# Patient Record
Sex: Female | Born: 1970 | Race: Black or African American | Hispanic: No | Marital: Single | Smoking: Current every day smoker
Health system: Southern US, Community
[De-identification: ages and names within clinical notes are randomized; demographics above are authoritative.]

## PROBLEM LIST (undated history)

## (undated) DIAGNOSIS — F419 Anxiety disorder, unspecified: Secondary | ICD-10-CM

## (undated) DIAGNOSIS — D219 Benign neoplasm of connective and other soft tissue, unspecified: Secondary | ICD-10-CM

## (undated) DIAGNOSIS — M1990 Unspecified osteoarthritis, unspecified site: Secondary | ICD-10-CM

## (undated) DIAGNOSIS — K219 Gastro-esophageal reflux disease without esophagitis: Secondary | ICD-10-CM

## (undated) DIAGNOSIS — F32A Depression, unspecified: Secondary | ICD-10-CM

## (undated) DIAGNOSIS — N200 Calculus of kidney: Secondary | ICD-10-CM

## (undated) DIAGNOSIS — J45909 Unspecified asthma, uncomplicated: Secondary | ICD-10-CM

## (undated) DIAGNOSIS — F329 Major depressive disorder, single episode, unspecified: Secondary | ICD-10-CM

## (undated) DIAGNOSIS — N839 Noninflammatory disorder of ovary, fallopian tube and broad ligament, unspecified: Secondary | ICD-10-CM

## (undated) DIAGNOSIS — I10 Essential (primary) hypertension: Secondary | ICD-10-CM

## (undated) DIAGNOSIS — R519 Headache, unspecified: Secondary | ICD-10-CM

## (undated) DIAGNOSIS — N83209 Unspecified ovarian cyst, unspecified side: Secondary | ICD-10-CM

## (undated) DIAGNOSIS — Z8489 Family history of other specified conditions: Secondary | ICD-10-CM

## (undated) DIAGNOSIS — J302 Other seasonal allergic rhinitis: Secondary | ICD-10-CM

## (undated) HISTORY — DX: Noninflammatory disorder of ovary, fallopian tube and broad ligament, unspecified: N83.9

## (undated) HISTORY — PX: TONSILLECTOMY: SUR1361

## (undated) HISTORY — DX: Unspecified ovarian cyst, unspecified side: N83.209

## (undated) HISTORY — PX: APPENDECTOMY: SHX54

## (undated) HISTORY — PX: EXPLORATORY LAPAROTOMY W/ BOWEL RESECTION: SHX1544

## (undated) HISTORY — PX: OTHER SURGICAL HISTORY: SHX169

## (undated) HISTORY — PX: CHOLECYSTECTOMY: SHX55

## (undated) HISTORY — DX: Other seasonal allergic rhinitis: J30.2

---
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
## 1998-02-24 ENCOUNTER — Emergency Department (HOSPITAL_COMMUNITY): Admission: EM | Admit: 1998-02-24 | Discharge: 1998-02-24 | Payer: Self-pay | Admitting: Emergency Medicine

## 1998-02-24 ENCOUNTER — Encounter: Payer: Self-pay | Admitting: Emergency Medicine

## 1998-09-27 ENCOUNTER — Emergency Department (HOSPITAL_COMMUNITY): Admission: EM | Admit: 1998-09-27 | Discharge: 1998-09-27 | Payer: Self-pay

## 1999-03-21 ENCOUNTER — Emergency Department (HOSPITAL_COMMUNITY): Admission: EM | Admit: 1999-03-21 | Discharge: 1999-03-21 | Payer: Self-pay | Admitting: Emergency Medicine

## 2000-02-14 ENCOUNTER — Emergency Department (HOSPITAL_COMMUNITY): Admission: EM | Admit: 2000-02-14 | Discharge: 2000-02-14 | Payer: Self-pay | Admitting: Emergency Medicine

## 2001-12-03 ENCOUNTER — Emergency Department (HOSPITAL_COMMUNITY): Admission: EM | Admit: 2001-12-03 | Discharge: 2001-12-03 | Payer: Self-pay | Admitting: Emergency Medicine

## 2001-12-15 ENCOUNTER — Encounter: Payer: Self-pay | Admitting: Emergency Medicine

## 2001-12-15 ENCOUNTER — Emergency Department (HOSPITAL_COMMUNITY): Admission: EM | Admit: 2001-12-15 | Discharge: 2001-12-15 | Payer: Self-pay | Admitting: Emergency Medicine

## 2002-05-09 ENCOUNTER — Emergency Department (HOSPITAL_COMMUNITY): Admission: EM | Admit: 2002-05-09 | Discharge: 2002-05-10 | Payer: Self-pay | Admitting: Emergency Medicine

## 2003-08-23 ENCOUNTER — Emergency Department (HOSPITAL_COMMUNITY): Admission: EM | Admit: 2003-08-23 | Discharge: 2003-08-23 | Payer: Self-pay | Admitting: Emergency Medicine

## 2003-12-19 ENCOUNTER — Emergency Department (HOSPITAL_COMMUNITY): Admission: EM | Admit: 2003-12-19 | Discharge: 2003-12-19 | Payer: Self-pay | Admitting: Emergency Medicine

## 2004-01-02 ENCOUNTER — Emergency Department (HOSPITAL_COMMUNITY): Admission: EM | Admit: 2004-01-02 | Discharge: 2004-01-02 | Payer: Self-pay | Admitting: Emergency Medicine

## 2004-01-05 ENCOUNTER — Emergency Department (HOSPITAL_COMMUNITY): Admission: EM | Admit: 2004-01-05 | Discharge: 2004-01-05 | Payer: Self-pay | Admitting: Emergency Medicine

## 2004-03-12 ENCOUNTER — Emergency Department (HOSPITAL_COMMUNITY): Admission: EM | Admit: 2004-03-12 | Discharge: 2004-03-13 | Payer: Self-pay | Admitting: Emergency Medicine

## 2004-03-22 ENCOUNTER — Inpatient Hospital Stay (HOSPITAL_COMMUNITY): Admission: AD | Admit: 2004-03-22 | Discharge: 2004-03-23 | Payer: Self-pay | Admitting: Obstetrics and Gynecology

## 2004-04-05 ENCOUNTER — Encounter (INDEPENDENT_AMBULATORY_CARE_PROVIDER_SITE_OTHER): Payer: Self-pay | Admitting: *Deleted

## 2004-04-07 ENCOUNTER — Ambulatory Visit: Payer: Self-pay | Admitting: Family Medicine

## 2004-04-07 ENCOUNTER — Other Ambulatory Visit: Admission: RE | Admit: 2004-04-07 | Discharge: 2004-04-07 | Payer: Self-pay | Admitting: Family Medicine

## 2004-07-07 ENCOUNTER — Ambulatory Visit: Payer: Self-pay | Admitting: Family Medicine

## 2004-07-12 ENCOUNTER — Ambulatory Visit: Payer: Self-pay | Admitting: Family Medicine

## 2004-07-17 ENCOUNTER — Encounter: Admission: RE | Admit: 2004-07-17 | Discharge: 2004-10-15 | Payer: Self-pay | Admitting: Family Medicine

## 2004-08-04 ENCOUNTER — Ambulatory Visit: Payer: Self-pay | Admitting: Sports Medicine

## 2004-09-05 ENCOUNTER — Ambulatory Visit: Payer: Self-pay | Admitting: Family Medicine

## 2004-12-12 ENCOUNTER — Ambulatory Visit: Payer: Self-pay | Admitting: Family Medicine

## 2005-10-25 ENCOUNTER — Ambulatory Visit: Payer: Self-pay | Admitting: Sports Medicine

## 2005-11-08 ENCOUNTER — Inpatient Hospital Stay (HOSPITAL_COMMUNITY): Admission: EM | Admit: 2005-11-08 | Discharge: 2005-11-21 | Payer: Self-pay | Admitting: Emergency Medicine

## 2005-11-09 ENCOUNTER — Encounter (INDEPENDENT_AMBULATORY_CARE_PROVIDER_SITE_OTHER): Payer: Self-pay | Admitting: Specialist

## 2005-11-22 ENCOUNTER — Ambulatory Visit: Payer: Self-pay | Admitting: Family Medicine

## 2005-11-27 ENCOUNTER — Ambulatory Visit: Payer: Self-pay | Admitting: Family Medicine

## 2006-04-04 DIAGNOSIS — I10 Essential (primary) hypertension: Secondary | ICD-10-CM | POA: Insufficient documentation

## 2006-04-04 DIAGNOSIS — K219 Gastro-esophageal reflux disease without esophagitis: Secondary | ICD-10-CM | POA: Insufficient documentation

## 2006-04-04 DIAGNOSIS — F339 Major depressive disorder, recurrent, unspecified: Secondary | ICD-10-CM | POA: Insufficient documentation

## 2006-04-04 DIAGNOSIS — E119 Type 2 diabetes mellitus without complications: Secondary | ICD-10-CM | POA: Insufficient documentation

## 2006-04-04 DIAGNOSIS — E669 Obesity, unspecified: Secondary | ICD-10-CM | POA: Insufficient documentation

## 2006-04-04 DIAGNOSIS — F410 Panic disorder [episodic paroxysmal anxiety] without agoraphobia: Secondary | ICD-10-CM | POA: Insufficient documentation

## 2006-04-05 ENCOUNTER — Encounter (INDEPENDENT_AMBULATORY_CARE_PROVIDER_SITE_OTHER): Payer: Self-pay | Admitting: *Deleted

## 2007-05-22 ENCOUNTER — Encounter (INDEPENDENT_AMBULATORY_CARE_PROVIDER_SITE_OTHER): Payer: Self-pay | Admitting: Family Medicine

## 2007-05-22 ENCOUNTER — Ambulatory Visit: Payer: Self-pay | Admitting: Family Medicine

## 2007-05-22 ENCOUNTER — Other Ambulatory Visit: Admission: RE | Admit: 2007-05-22 | Discharge: 2007-05-22 | Payer: Self-pay | Admitting: Family Medicine

## 2007-05-22 DIAGNOSIS — E785 Hyperlipidemia, unspecified: Secondary | ICD-10-CM | POA: Insufficient documentation

## 2007-05-22 LAB — CONVERTED CEMR LAB
Hgb A1c MFr Bld: 9 %
Pap Smear: NORMAL
Whiff Test: POSITIVE

## 2007-05-23 LAB — CONVERTED CEMR LAB
ALT: 16 units/L (ref 0–35)
AST: 14 units/L (ref 0–37)
Alkaline Phosphatase: 78 units/L (ref 39–117)
CO2: 25 meq/L (ref 19–32)
Chlamydia, DNA Probe: NEGATIVE
Creatinine, Ser: 0.64 mg/dL (ref 0.40–1.20)
GC Probe Amp, Genital: NEGATIVE
Sodium: 139 meq/L (ref 135–145)
Total Bilirubin: 0.2 mg/dL — ABNORMAL LOW (ref 0.3–1.2)
Total Protein: 8 g/dL (ref 6.0–8.3)

## 2007-06-05 ENCOUNTER — Ambulatory Visit: Payer: Self-pay | Admitting: Family Medicine

## 2007-06-05 ENCOUNTER — Encounter (INDEPENDENT_AMBULATORY_CARE_PROVIDER_SITE_OTHER): Payer: Self-pay | Admitting: Family Medicine

## 2007-06-06 DIAGNOSIS — L732 Hidradenitis suppurativa: Secondary | ICD-10-CM | POA: Insufficient documentation

## 2007-06-06 LAB — CONVERTED CEMR LAB
BUN: 11 mg/dL (ref 6–23)
CO2: 19 meq/L (ref 19–32)
Chloride: 103 meq/L (ref 96–112)
LDL Cholesterol: 94 mg/dL (ref 0–99)
Potassium: 4.3 meq/L (ref 3.5–5.3)
Triglycerides: 80 mg/dL (ref <150)
VLDL: 16 mg/dL (ref 0–40)

## 2007-11-13 ENCOUNTER — Ambulatory Visit: Payer: Self-pay | Admitting: Family Medicine

## 2007-11-13 ENCOUNTER — Telehealth (INDEPENDENT_AMBULATORY_CARE_PROVIDER_SITE_OTHER): Payer: Self-pay | Admitting: *Deleted

## 2007-11-13 LAB — CONVERTED CEMR LAB: Hgb A1c MFr Bld: 8.2 %

## 2007-12-22 ENCOUNTER — Ambulatory Visit: Payer: Self-pay | Admitting: Family Medicine

## 2007-12-22 ENCOUNTER — Encounter: Payer: Self-pay | Admitting: Family Medicine

## 2007-12-22 DIAGNOSIS — N920 Excessive and frequent menstruation with regular cycle: Secondary | ICD-10-CM | POA: Insufficient documentation

## 2007-12-22 LAB — CONVERTED CEMR LAB
Chloride: 102 meq/L (ref 96–112)
Glucose, Bld: 149 mg/dL — ABNORMAL HIGH (ref 70–99)
MCHC: 32.4 g/dL (ref 30.0–36.0)
Potassium: 4.3 meq/L (ref 3.5–5.3)
RBC: 4.63 M/uL (ref 3.87–5.11)
RDW: 14.7 % (ref 11.5–15.5)
Sodium: 140 meq/L (ref 135–145)

## 2007-12-25 ENCOUNTER — Emergency Department (HOSPITAL_BASED_OUTPATIENT_CLINIC_OR_DEPARTMENT_OTHER): Admission: EM | Admit: 2007-12-25 | Discharge: 2007-12-25 | Payer: Self-pay | Admitting: Emergency Medicine

## 2008-03-07 ENCOUNTER — Emergency Department (HOSPITAL_BASED_OUTPATIENT_CLINIC_OR_DEPARTMENT_OTHER): Admission: EM | Admit: 2008-03-07 | Discharge: 2008-03-07 | Payer: Self-pay | Admitting: Emergency Medicine

## 2008-03-29 ENCOUNTER — Telehealth: Payer: Self-pay | Admitting: *Deleted

## 2008-05-20 ENCOUNTER — Telehealth (INDEPENDENT_AMBULATORY_CARE_PROVIDER_SITE_OTHER): Payer: Self-pay | Admitting: Family Medicine

## 2008-05-23 ENCOUNTER — Emergency Department (HOSPITAL_BASED_OUTPATIENT_CLINIC_OR_DEPARTMENT_OTHER): Admission: EM | Admit: 2008-05-23 | Discharge: 2008-05-23 | Payer: Self-pay | Admitting: Emergency Medicine

## 2008-05-31 ENCOUNTER — Ambulatory Visit: Payer: Self-pay | Admitting: Family Medicine

## 2008-05-31 ENCOUNTER — Telehealth (INDEPENDENT_AMBULATORY_CARE_PROVIDER_SITE_OTHER): Payer: Self-pay | Admitting: Family Medicine

## 2008-05-31 LAB — CONVERTED CEMR LAB: Hgb A1c MFr Bld: 10.2 %

## 2008-06-01 ENCOUNTER — Encounter: Payer: Self-pay | Admitting: Family Medicine

## 2008-06-15 ENCOUNTER — Encounter (INDEPENDENT_AMBULATORY_CARE_PROVIDER_SITE_OTHER): Payer: Self-pay | Admitting: Family Medicine

## 2008-06-15 ENCOUNTER — Ambulatory Visit: Payer: Self-pay | Admitting: Family Medicine

## 2008-06-15 LAB — CONVERTED CEMR LAB
ALT: 16 units/L (ref 0–35)
Albumin: 4.2 g/dL (ref 3.5–5.2)
CO2: 26 meq/L (ref 19–32)
Calcium: 9.6 mg/dL (ref 8.4–10.5)
Chloride: 97 meq/L (ref 96–112)
Sodium: 135 meq/L (ref 135–145)
TSH: 1.444 microintl units/mL (ref 0.350–4.500)
Total Protein: 7.9 g/dL (ref 6.0–8.3)

## 2008-06-28 ENCOUNTER — Telehealth: Payer: Self-pay | Admitting: *Deleted

## 2008-07-23 ENCOUNTER — Ambulatory Visit: Payer: Self-pay | Admitting: Family Medicine

## 2008-07-23 ENCOUNTER — Telehealth: Payer: Self-pay | Admitting: Family Medicine

## 2008-08-04 ENCOUNTER — Encounter (INDEPENDENT_AMBULATORY_CARE_PROVIDER_SITE_OTHER): Payer: Self-pay | Admitting: Family Medicine

## 2008-11-16 ENCOUNTER — Encounter: Payer: Self-pay | Admitting: Family Medicine

## 2008-11-16 ENCOUNTER — Ambulatory Visit: Payer: Self-pay | Admitting: Family Medicine

## 2008-11-19 ENCOUNTER — Ambulatory Visit: Payer: Self-pay | Admitting: Family Medicine

## 2008-12-13 ENCOUNTER — Telehealth: Payer: Self-pay | Admitting: Family Medicine

## 2009-01-08 ENCOUNTER — Emergency Department (HOSPITAL_BASED_OUTPATIENT_CLINIC_OR_DEPARTMENT_OTHER): Admission: EM | Admit: 2009-01-08 | Discharge: 2009-01-08 | Payer: Self-pay | Admitting: Emergency Medicine

## 2009-01-21 ENCOUNTER — Telehealth: Payer: Self-pay | Admitting: Family Medicine

## 2009-02-08 ENCOUNTER — Telehealth: Payer: Self-pay | Admitting: Family Medicine

## 2009-03-14 ENCOUNTER — Emergency Department (HOSPITAL_BASED_OUTPATIENT_CLINIC_OR_DEPARTMENT_OTHER): Admission: EM | Admit: 2009-03-14 | Discharge: 2009-03-14 | Payer: Self-pay | Admitting: Emergency Medicine

## 2009-03-16 ENCOUNTER — Emergency Department (HOSPITAL_BASED_OUTPATIENT_CLINIC_OR_DEPARTMENT_OTHER): Admission: EM | Admit: 2009-03-16 | Discharge: 2009-03-17 | Payer: Self-pay | Admitting: Emergency Medicine

## 2009-07-24 ENCOUNTER — Encounter: Payer: Self-pay | Admitting: Family Medicine

## 2009-07-25 ENCOUNTER — Ambulatory Visit: Payer: Self-pay | Admitting: Diagnostic Radiology

## 2009-07-25 ENCOUNTER — Emergency Department (HOSPITAL_BASED_OUTPATIENT_CLINIC_OR_DEPARTMENT_OTHER): Admission: EM | Admit: 2009-07-25 | Discharge: 2009-07-25 | Payer: Self-pay | Admitting: Emergency Medicine

## 2009-07-27 ENCOUNTER — Other Ambulatory Visit: Admission: RE | Admit: 2009-07-27 | Discharge: 2009-07-27 | Payer: Self-pay | Admitting: Gynecology

## 2009-07-27 ENCOUNTER — Ambulatory Visit: Payer: Self-pay | Admitting: Gynecology

## 2009-08-02 ENCOUNTER — Ambulatory Visit: Payer: Self-pay | Admitting: Diagnostic Radiology

## 2009-08-02 ENCOUNTER — Emergency Department (HOSPITAL_BASED_OUTPATIENT_CLINIC_OR_DEPARTMENT_OTHER): Admission: EM | Admit: 2009-08-02 | Discharge: 2009-08-02 | Payer: Self-pay | Admitting: Emergency Medicine

## 2009-09-23 ENCOUNTER — Emergency Department (HOSPITAL_BASED_OUTPATIENT_CLINIC_OR_DEPARTMENT_OTHER): Admission: EM | Admit: 2009-09-23 | Discharge: 2009-09-23 | Payer: Self-pay | Admitting: Emergency Medicine

## 2009-09-23 ENCOUNTER — Ambulatory Visit: Payer: Self-pay | Admitting: Diagnostic Radiology

## 2009-11-16 ENCOUNTER — Ambulatory Visit: Payer: Self-pay | Admitting: Radiology

## 2010-01-12 ENCOUNTER — Emergency Department (HOSPITAL_BASED_OUTPATIENT_CLINIC_OR_DEPARTMENT_OTHER): Admission: EM | Admit: 2010-01-12 | Discharge: 2009-11-16 | Payer: Self-pay | Admitting: Emergency Medicine

## 2010-03-02 ENCOUNTER — Emergency Department (HOSPITAL_BASED_OUTPATIENT_CLINIC_OR_DEPARTMENT_OTHER)
Admission: EM | Admit: 2010-03-02 | Discharge: 2010-03-02 | Payer: Self-pay | Source: Home / Self Care | Admitting: Emergency Medicine

## 2010-03-02 LAB — URINALYSIS, ROUTINE W REFLEX MICROSCOPIC
Bilirubin Urine: NEGATIVE
Hgb urine dipstick: NEGATIVE
Ketones, ur: NEGATIVE mg/dL
Protein, ur: NEGATIVE mg/dL
Urine Glucose, Fasting: NEGATIVE mg/dL
Urobilinogen, UA: 0.2 mg/dL (ref 0.0–1.0)

## 2010-03-02 LAB — CBC
HCT: 33.4 % — ABNORMAL LOW (ref 36.0–46.0)
MCV: 78.4 fL (ref 78.0–100.0)
Platelets: 392 10*3/uL (ref 150–400)
RBC: 4.26 MIL/uL (ref 3.87–5.11)
RDW: 13.7 % (ref 11.5–15.5)
WBC: 11.4 10*3/uL — ABNORMAL HIGH (ref 4.0–10.5)

## 2010-03-02 LAB — COMPREHENSIVE METABOLIC PANEL
ALT: 18 U/L (ref 0–35)
Albumin: 4.2 g/dL (ref 3.5–5.2)
Alkaline Phosphatase: 84 U/L (ref 39–117)
BUN: 21 mg/dL (ref 6–23)
Chloride: 103 mEq/L (ref 96–112)
Potassium: 4 mEq/L (ref 3.5–5.1)
Sodium: 140 mEq/L (ref 135–145)
Total Bilirubin: 0.4 mg/dL (ref 0.3–1.2)
Total Protein: 8 g/dL (ref 6.0–8.3)

## 2010-03-02 LAB — DIFFERENTIAL
Basophils Absolute: 0 10*3/uL (ref 0.0–0.1)
Eosinophils Relative: 2 % (ref 0–5)
Lymphocytes Relative: 34 % (ref 12–46)
Lymphs Abs: 3.9 10*3/uL (ref 0.7–4.0)
Neutrophils Relative %: 56 % (ref 43–77)

## 2010-03-09 NOTE — Miscellaneous (Signed)
   Clinical Lists Changes  Problems: Removed problem of ABSCESS, BREAST, RIGHT (ICD-611.0) Removed problem of ROUTINE GYNECOLOGICAL EXAMINATION (ICD-V72.31) Removed problem of SCREENING FOR MALIGNANT NEOPLASM OF THE CERVIX (ICD-V76.2) Medications: Removed medication of GLIPIZIDE 10 MG TABS (GLIPIZIDE) Take 1 by mouth two times a day for diabetes Removed medication of METFORMIN HCL 1000 MG TABS (METFORMIN HCL) Take 1 by mouth twice a day for diabetes Removed medication of LISINOPRIL-HYDROCHLOROTHIAZIDE 20-25 MG TABS (LISINOPRIL-HYDROCHLOROTHIAZIDE) one tab by mouth daily Removed medication of SIMVASTATIN 80 MG  TABS (SIMVASTATIN) 1 tablet by mouth every evening for cholesterol - this is to replace previous dose Removed medication of FLEXERIL 10 MG TABS (CYCLOBENZAPRINE HCL) two times a day as needed pain Removed medication of TRIAMCINOLONE ACETONIDE 0.1 % LOTN (TRIAMCINOLONE ACETONIDE) apply to afected areas on body two times a day Do not apply to face Dispense 1 large tube Removed medication of CLARITIN 10 MG TABS (LORATADINE) 1 by mouth Daily Removed medication of RANITIDINE HCL 150 MG TABS (RANITIDINE HCL) 1 tablet by mouth two times a day for heartburn

## 2010-03-09 NOTE — Progress Notes (Signed)
Summary: triage   Phone Note Call from Patient Call back at Home Phone [phone]   Caller: Patient Summary of Call: had an incision a few months ago and it is has continued to leak.  is a diabetic Initial call taken by: De Nurse,  February 08, 2009 1:40 PM  Follow-up for Phone Call        had cyst removed from under her breast a few months ago. drains at time of her cycle. feels a hard lump. no pain. fluid is pus, bloody when expressed. appt tomorrow at 2:45. she will arrange to leave work early to keep appt. will call back if unable to get off early gave her the number for [facility] as she no longer has Medicaid. told her she will need to bring money to tomorrow's appt & can make payments after that Follow-up by: Golden Circle RN,  February 08, 2009 1:46 PM  Additional Follow-up for Phone Call Additional follow up Details #1::        she had I&D. missed subsequent wound checks. agree with plan Additional Follow-up by: Lequita Asal  MD,  February 08, 2009 1:53 PM

## 2010-03-09 NOTE — Assessment & Plan Note (Signed)
Summary: hand and feet numbness/ACM   Vital Signs:  Patient Profile:   40 Years Old Female Height:     65 inches Weight:      254 pounds Pulse rate:   95 / minute BP sitting:   195 / 127 Cuff size:   regular  Vitals Entered By: Lillia Pauls CMA (November 13, 2007 2:07 PM)                 Serial Vital Signs/Assessments:  Time      Position  BP       Pulse  Resp  Temp     By 3:07 PM             139/82   88                    JESSICA FLEEGER CMA   PCP:  Drue Dun MD  Chief Complaint:  F/U BP and has not been taking meds.  History of Present Illness: Holly Wells is a 39year old woman that has not been taking her BP meds and is worried that her BG may be high.  1. HTN: On Rx Lisinopril 20 mg daily, but has not taken medication since Sunday (4 days). Has not been taking blood pressures at home. Today, BP 195/127. Checked again with larger cuff: 139/82.  2. Diabetes: On Rx Glipizide-Metformin 5-500 2 pills twice daily. States that she takes her medications, but does not check her BG herself because she left her glucometer and supplies in another state.   3. Hyperlipidemia: On Rx Simvastatin 40. Denies myalgias.  Denies CP, SOB, N/V, +Diarrhea x 1 yesterday, + mild HA- generalized, described as band across head, + blurry vision. She also complains of fatigue, daytime somnolence, snoring. She denies peripheral edema. Endorses good diet choices and exercise daily.  Weight at last visit: 256.6 April/09 BP at last visit: 152/98 April/09    Updated Prior Medication List: LISINOPRIL-HYDROCHLOROTHIAZIDE 20-12.5 MG TABS (LISINOPRIL-HYDROCHLOROTHIAZIDE) 1 tablet by mouth daily GLIPIZIDE-METFORMIN HCL 5-500 MG  TABS (GLIPIZIDE-METFORMIN HCL) 2 tablets by mouth two times a day with food SIMVASTATIN 80 MG  TABS (SIMVASTATIN) 1 tablet by mouth every evening for cholesterol - this is to replace previous dose  Current Allergies: No known allergies    Family History:    Reviewed  history from 05/22/2007 and no changes required:       Aunt: colon ca       Father: DM, Depression, HTN       Mother: Esophageal stricture, allergies       FHx of Prostate Cancer, DM, HTN on dad's side       FHx of DM, HTN on mother's side  Social History:    Reviewed history from 05/22/2007 and no changes required:       lives with 2 daughters (ages 70 and 2). She is unemployed (laid off clerical).  No tob, social occ ETOH, no IVDU.  Getting regular exercise at [employer]    Review of Systems       The patient complains of dyspnea on exertion and headaches.  The patient denies weight gain, vision loss, chest pain, syncope, peripheral edema, prolonged cough, abdominal pain, suspicious skin lesions, difficulty walking, and depression.     Physical Exam  General:     alert, well-developed, well-nourished, well-hydrated, and overweight-appearing.   Eyes:     vision grossly intact, pupils equal, pupils round, and pupils reactive to light.   Lungs:  Normal respiratory effort, chest expands symmetrically. Lungs are clear to auscultation, no crackles or wheezes. Heart:     Normal rate and regular rhythm. S1 and S2 normal without gallop, murmur, click, rub or other extra sounds. Abdomen:     Bowel sounds positive,abdomen soft and non-tender without masses, organomegaly or hernias noted. Pulses:     R and L dorsalis pedis and posterior tibial pulses are full and equal bilaterally Extremities:     No clubbing, cyanosis, edema, or deformity noted with normal full range of motion of all joints.   Neurologic:     No cranial nerve deficits noted. Plantar reflexes are down-going bilaterally. DTRs are symmetrical throughout. Sensory, motor and coordinative functions appear intact. Skin:     Intact without suspicious lesions or rashes  Diabetes Management Exam:    Foot Exam (with socks and/or shoes not present):       Sensory-Pinprick/Light touch:          Left medial foot (L-4):  normal          Left dorsal foot (L-5): normal          Left lateral foot (S-1): normal       Sensory-Monofilament:          Left foot: diminished          Right foot: diminished       Sensory-other: Diminished sensation great toes bilaterally, otherwise normal.       Inspection:          Left foot: normal       Nails:          Left foot: normal          Right foot: normal    Impression & Recommendations:  Problem # 1:  HYPERTENSION, BENIGN SYSTEMIC (ICD-401.1) Assessment: Deteriorated Will add HCTZ 12.5 to current Rx of Lisinopril 20. Will have patient follow up in 1-2 weeks with PCP for recheck. I have encouraged her to check her BP at home or at the drug store daily until the next visit. I encouraged her to get regular follow-up for her chronic conditions as they may lead to heart disease, blindness, kidney failure, etc if left uncontrolled. I advised her to call the clinic or go to the ER is she has CP, SOB, double vision, confusion, problems with speech, or loss of muscle control on one side of her body, or any other concerns. Her updated medication list for this problem includes:    Lisinopril-hydrochlorothiazide 20-12.5 Mg Tabs (Lisinopril-hydrochlorothiazide) .Marland Kitchen... 1 tablet by mouth daily  Orders: [facility]- Est Level  3 (56213)   Problem # 2:  DIABETES MELLITUS II, UNCOMPLICATED (ICD-250.00) Assessment: Unchanged Continue current medications. Check BG regularly. Awaiting HgbA1c results from today. I discussed the possibility of early diabetic peripheral neuropathy and made her aware that we should focus on preventing this complication by controlling her diabetes through diet, exercise, and regular monitoring of her BG.  Her updated medication list for this problem includes:    Lisinopril-hydrochlorothiazide 20-12.5 Mg Tabs (Lisinopril-hydrochlorothiazide) .Marland Kitchen... 1 tablet by mouth daily    Glipizide-metformin Hcl 5-500 Mg Tabs (Glipizide-metformin hcl) .Marland Kitchen... 2 tablets by mouth two  times a day with food  Orders: A1C-[facility] (08657) Glucose Cap-[facility] (84696) [facility]- Est Level  3 (29528)   Problem # 3:  HYPERLIPIDEMIA (ICD-272.4) Assessment: Comment Only Continue Simvastatin. Her updated medication list for this problem includes:    Simvastatin 80 Mg Tabs (Simvastatin) .Marland Kitchen... 1 tablet by mouth every evening for  cholesterol - this is to replace previous dose  Orders: [facility]- Est Level  3 (04540)   Problem # 4:  OBESITY, NOS (ICD-278.00) Assessment: Unchanged I encouraged a low fat, low sodium diabetic diet and regular exercise. Orders: [facility]- Est Level  3 (98119)   Complete Medication List: 1)  Lisinopril-hydrochlorothiazide 20-12.5 Mg Tabs (Lisinopril-hydrochlorothiazide) .Marland Kitchen.. 1 tablet by mouth daily 2)  Glipizide-metformin Hcl 5-500 Mg Tabs (Glipizide-metformin hcl) .... 2 tablets by mouth two times a day with food 3)  Simvastatin 80 Mg Tabs (Simvastatin) .Marland Kitchen.. 1 tablet by mouth every evening for cholesterol - this is to replace previous dose   Patient Instructions: 1)  Please schedule a follow-up appointment in 1- 2 weeks with PCP, Dr. Deirdre Peer. 2)  Check your blood sugars regularly. If your readings are usually above 300  or below 70 you should contact our office. 3)  It is important that your Diabetic A1c level is checked every 3 months. 4)  See your eye doctor yearly to check for diabetic eye damage. 5)  Check your feet each night for sore areas, calluses or signs of infection. 6)  Check your Blood Pressure regularly. If it is above 160/100 you should make an appointment.   Prescriptions: LISINOPRIL-HYDROCHLOROTHIAZIDE 20-12.5 MG TABS (LISINOPRIL-HYDROCHLOROTHIAZIDE) 1 tablet by mouth daily  #30 x 3   Entered and Authorized by:   Helane Rima MD   Signed by:   Helane Rima MD on 11/13/2007   Method used:   Electronically to        [facility] [phone]* (retail)       [address]       [place], [place]  [address]       Ph: [phone]       Fax:  [phone]   RxID:   [id number]  ] Laboratory Results   Blood Tests   Date/Time Received: November 13, 2007 2:09 PM  Date/Time Reported: November 13, 2007 2:19 PM   HGBA1C: 8.2%   (Normal Range: Non-Diabetic - 3-6%   Control Diabetic - 6-8%)  Comments: ...............test performed by......Marland KitchenBonnie A. [place], [place] (ASCP)

## 2010-03-29 ENCOUNTER — Other Ambulatory Visit: Payer: PRIVATE HEALTH INSURANCE

## 2010-03-29 ENCOUNTER — Ambulatory Visit (INDEPENDENT_AMBULATORY_CARE_PROVIDER_SITE_OTHER): Payer: PRIVATE HEALTH INSURANCE | Admitting: Gynecology

## 2010-03-29 DIAGNOSIS — N920 Excessive and frequent menstruation with regular cycle: Secondary | ICD-10-CM

## 2010-03-29 DIAGNOSIS — N949 Unspecified condition associated with female genital organs and menstrual cycle: Secondary | ICD-10-CM

## 2010-03-29 DIAGNOSIS — D3910 Neoplasm of uncertain behavior of unspecified ovary: Secondary | ICD-10-CM

## 2010-03-29 DIAGNOSIS — N83209 Unspecified ovarian cyst, unspecified side: Secondary | ICD-10-CM

## 2010-04-21 LAB — WET PREP, GENITAL: Trich, Wet Prep: NONE SEEN

## 2010-04-21 LAB — DIFFERENTIAL
Basophils Relative: 1 % (ref 0–1)
Monocytes Relative: 5 % (ref 3–12)
Neutro Abs: 6.9 10*3/uL (ref 1.7–7.7)
Neutrophils Relative %: 75 % (ref 43–77)

## 2010-04-21 LAB — GC/CHLAMYDIA PROBE AMP, GENITAL
Chlamydia, DNA Probe: NEGATIVE
GC Probe Amp, Genital: NEGATIVE

## 2010-04-21 LAB — URINE MICROSCOPIC-ADD ON

## 2010-04-21 LAB — URINALYSIS, ROUTINE W REFLEX MICROSCOPIC
Bilirubin Urine: NEGATIVE
Glucose, UA: >1000 mg/dL — AB
Hgb urine dipstick: NEGATIVE
Protein, ur: NEGATIVE mg/dL
Urobilinogen, UA: 0.2 mg/dL (ref 0.0–1.0)

## 2010-04-21 LAB — CBC
Hemoglobin: 12.3 g/dL (ref 12.0–15.0)
MCHC: 33.6 g/dL (ref 30.0–36.0)
Platelets: 366 10*3/uL (ref 150–400)
RBC: 4.33 MIL/uL (ref 3.87–5.11)

## 2010-04-21 LAB — BASIC METABOLIC PANEL
Calcium: 9.6 mg/dL (ref 8.4–10.5)
GFR calc Af Amer: >60 mL/min (ref >60)
GFR calc non Af Amer: >60 mL/min (ref >60)
Glucose, Bld: 267 mg/dL — ABNORMAL HIGH (ref 70–99)
Sodium: 139 mEq/L (ref 135–145)

## 2010-04-23 LAB — CBC
HCT: 36.6 % (ref 36.0–46.0)
Hemoglobin: 11.9 g/dL — ABNORMAL LOW (ref 12.0–15.0)
MCH: 27.6 pg (ref 26.0–34.0)
MCV: 83.2 fL (ref 78.0–100.0)
Platelets: 322 10*3/uL (ref 150–400)
RBC: 4.19 MIL/uL (ref 3.87–5.11)
RBC: 4.39 MIL/uL (ref 3.87–5.11)
RDW: 12.9 % (ref 11.5–15.5)
RDW: 13.5 % (ref 11.5–15.5)
WBC: 10.4 10*3/uL (ref 4.0–10.5)
WBC: 9.2 10*3/uL (ref 4.0–10.5)

## 2010-04-23 LAB — BASIC METABOLIC PANEL
BUN: 13 mg/dL (ref 6–23)
CO2: 26 mEq/L (ref 19–32)
Calcium: 9.1 mg/dL (ref 8.4–10.5)
Chloride: 101 mEq/L (ref 96–112)
Creatinine, Ser: 0.6 mg/dL (ref 0.4–1.2)
GFR calc Af Amer: >60 mL/min (ref >60)
GFR calc Af Amer: >60 mL/min (ref >60)
GFR calc non Af Amer: >60 mL/min (ref >60)
Glucose, Bld: 291 mg/dL — ABNORMAL HIGH (ref 70–99)
Potassium: 4.3 mEq/L (ref 3.5–5.1)
Sodium: 136 mEq/L (ref 135–145)

## 2010-04-23 LAB — POCT CARDIAC MARKERS
CKMB, poc: <1 ng/mL — ABNORMAL LOW (ref 1.0–8.0)
Myoglobin, poc: 36.6 ng/mL (ref 12–200)
Troponin i, poc: <0.05 ng/mL (ref 0.00–0.09)

## 2010-04-23 LAB — URINALYSIS, ROUTINE W REFLEX MICROSCOPIC
Glucose, UA: 250 mg/dL — AB
Hgb urine dipstick: NEGATIVE
Specific Gravity, Urine: 1.03 (ref 1.005–1.030)

## 2010-04-23 LAB — DIFFERENTIAL
Basophils Absolute: 0 10*3/uL (ref 0.0–0.1)
Eosinophils Absolute: 0.2 10*3/uL (ref 0.0–0.7)
Eosinophils Relative: 2 % (ref 0–5)
Eosinophils Relative: 2 % (ref 0–5)
Lymphocytes Relative: 33 % (ref 12–46)
Lymphs Abs: 3 10*3/uL (ref 0.7–4.0)
Lymphs Abs: 3.4 10*3/uL (ref 0.7–4.0)
Monocytes Absolute: 0.6 10*3/uL (ref 0.1–1.0)
Monocytes Relative: 5 % (ref 3–12)
Neutro Abs: 6.2 10*3/uL (ref 1.7–7.7)
Neutrophils Relative %: 59 % (ref 43–77)

## 2010-04-23 LAB — PREGNANCY, URINE: Preg Test, Ur: NEGATIVE

## 2010-04-23 LAB — URINE MICROSCOPIC-ADD ON

## 2010-04-26 ENCOUNTER — Other Ambulatory Visit: Payer: PRIVATE HEALTH INSURANCE

## 2010-04-26 ENCOUNTER — Ambulatory Visit: Payer: PRIVATE HEALTH INSURANCE | Admitting: Gynecology

## 2010-04-26 LAB — URINALYSIS, ROUTINE W REFLEX MICROSCOPIC
Bilirubin Urine: NEGATIVE
Glucose, UA: NEGATIVE mg/dL
Hgb urine dipstick: NEGATIVE
Specific Gravity, Urine: 1.021 (ref 1.005–1.030)
pH: 6 (ref 5.0–8.0)

## 2010-05-01 ENCOUNTER — Other Ambulatory Visit: Payer: Self-pay | Admitting: Gynecology

## 2010-05-01 ENCOUNTER — Ambulatory Visit (INDEPENDENT_AMBULATORY_CARE_PROVIDER_SITE_OTHER): Payer: PRIVATE HEALTH INSURANCE | Admitting: Gynecology

## 2010-05-01 ENCOUNTER — Other Ambulatory Visit: Payer: PRIVATE HEALTH INSURANCE

## 2010-05-01 DIAGNOSIS — N920 Excessive and frequent menstruation with regular cycle: Secondary | ICD-10-CM

## 2010-05-01 DIAGNOSIS — R1031 Right lower quadrant pain: Secondary | ICD-10-CM

## 2010-05-01 DIAGNOSIS — N83209 Unspecified ovarian cyst, unspecified side: Secondary | ICD-10-CM

## 2010-05-01 DIAGNOSIS — R1903 Right lower quadrant abdominal swelling, mass and lump: Secondary | ICD-10-CM

## 2010-05-09 LAB — CBC
Hemoglobin: 11.9 g/dL — ABNORMAL LOW (ref 12.0–15.0)
MCHC: 32.7 g/dL (ref 30.0–36.0)
MCV: 84.3 fL (ref 78.0–100.0)
RBC: 4.31 MIL/uL (ref 3.87–5.11)
WBC: 9.7 10*3/uL (ref 4.0–10.5)

## 2010-05-09 LAB — GLUCOSE, CAPILLARY: Glucose-Capillary: 279 mg/dL — ABNORMAL HIGH (ref 70–99)

## 2010-05-09 LAB — DIFFERENTIAL
Eosinophils Absolute: 0.2 10*3/uL (ref 0.0–0.7)
Lymphs Abs: 3.2 10*3/uL (ref 0.7–4.0)
Monocytes Absolute: 0.7 10*3/uL (ref 0.1–1.0)
Monocytes Relative: 7 % (ref 3–12)
Neutrophils Relative %: 57 % (ref 43–77)

## 2010-05-09 LAB — BASIC METABOLIC PANEL
CO2: 26 mEq/L (ref 19–32)
Chloride: 101 mEq/L (ref 96–112)
Creatinine, Ser: 0.6 mg/dL (ref 0.4–1.2)
GFR calc Af Amer: >60 mL/min (ref >60)
Potassium: 4.1 mEq/L (ref 3.5–5.1)
Sodium: 139 mEq/L (ref 135–145)

## 2010-05-22 LAB — DIFFERENTIAL
Basophils Absolute: 0.1 10*3/uL (ref 0.0–0.1)
Basophils Relative: 1 % (ref 0–1)
Neutro Abs: 5.5 10*3/uL (ref 1.7–7.7)
Neutrophils Relative %: 84 % — ABNORMAL HIGH (ref 43–77)

## 2010-05-22 LAB — COMPREHENSIVE METABOLIC PANEL
Alkaline Phosphatase: 76 U/L (ref 39–117)
BUN: 11 mg/dL (ref 6–23)
Chloride: 101 mEq/L (ref 96–112)
Creatinine, Ser: 0.7 mg/dL (ref 0.4–1.2)
Glucose, Bld: 195 mg/dL — ABNORMAL HIGH (ref 70–99)
Potassium: 4.2 mEq/L (ref 3.5–5.1)
Total Bilirubin: 0.5 mg/dL (ref 0.3–1.2)

## 2010-05-22 LAB — CBC
HCT: 38.7 % (ref 36.0–46.0)
Hemoglobin: 12.3 g/dL (ref 12.0–15.0)
MCV: 83.2 fL (ref 78.0–100.0)
RDW: 13.3 % (ref 11.5–15.5)
WBC: 6.6 10*3/uL (ref 4.0–10.5)

## 2010-05-22 LAB — URINALYSIS, ROUTINE W REFLEX MICROSCOPIC
Bilirubin Urine: NEGATIVE
Hgb urine dipstick: NEGATIVE
Ketones, ur: NEGATIVE mg/dL
Nitrite: NEGATIVE
Urobilinogen, UA: 0.2 mg/dL (ref 0.0–1.0)

## 2010-06-02 ENCOUNTER — Institutional Professional Consult (permissible substitution) (INDEPENDENT_AMBULATORY_CARE_PROVIDER_SITE_OTHER): Payer: PRIVATE HEALTH INSURANCE | Admitting: Gynecology

## 2010-06-02 DIAGNOSIS — N83209 Unspecified ovarian cyst, unspecified side: Secondary | ICD-10-CM

## 2010-06-02 DIAGNOSIS — N949 Unspecified condition associated with female genital organs and menstrual cycle: Secondary | ICD-10-CM

## 2010-06-23 NOTE — H&P (Signed)
NAMEDOREA, DUFF               ACCOUNT NO.:  [id number]   MEDICAL RECORD NO.:  [id number]          PATIENT TYPE:  INP   LOCATION:  0098                         FACILITY:  [facility]   PHYSICIAN:  Anselm Pancoast. Weatherly, M.D.DATE OF BIRTH:  1970-10-20   DATE OF ADMISSION:  11/08/2005  DATE OF DISCHARGE:                                HISTORY & PHYSICAL   CHIEF COMPLAINT:  Abdominal pain two weeks.   HISTORY:  Holly Wells is a 40 year old overweight black female presenting  to the emergency room this evening with the following history.  She said  that for approximately 2 weeks she has been having increasing abdominal  pain.  She was seen by her regular physician thought to have a urinary tract  infection and was on antibiotics for approximately a week.  Her symptoms  persisted she did not have significant nausea and vomiting but started  having more pain and today presented to the emergency room she was seen by  the ER physician. Laboratory studies were unremarkable.  She was definitely  tender in the right lower quadrant.  She weighs about 275 pounds and a CT  was obtained which showed an abscess what looked like anterior to the cecum.  There was a chronic abscess in the abdominal wall in the appears to be a  foreign body that looks like a bone, kind of a long calcified type area deep  in the abdominal wall, or really right at the peritoneal area towards the  right lateral lower abdomen.  Dr. Effie Shy asked me to see the patient.  I saw  her probably about 10:30, 11 o'clock she was definitely mildly tender.  She  was not acutely tender with generalized peritoneal signs and on questioning  she said that this has just kind of been increasing.  It has always been  kind of the lower abdomen and she was treated for urinary tract infection.  She is followed in the family practice clinic and she is on metformin for an  elevated sugar.  She has a history of high blood pressure I am not sure that  she is actually on any blood pressure medications at this time.   PAST HISTORY:  She has had a previous open cholecystectomy years ago through  a very large right subcostal incision and has not had any other intestinal  problems.  I discussed with her and she said that she had gone to a fish fry  approximately 2 weeks ago but really does not remember anything as far as  swallowing a bone or foreign body.  I recommended that we proceed on with a  laparotomy.  She will need an open procedure and we hopefully will be able  to get to this through a transverse right lower quadrant type incision.  Her  size of certainly of significant concern.  Diabetes mellitus, adult onset  obesity and hypertension.   ALLERGIES:  No chronic medicines.   SOCIOECONOMIC:  She is single.  She works at [employer]. 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   FAMILY HISTORY:  I do not think  it is contributory.   She admits to occasional alcohol use and she denies smoking.   PHYSICAL EXAM:  GENERAL:  She is a short, very heavy black female in mild  discomfort but certainly not in real acute distress.  VITAL SIGNS:  Temperature was 99.3, pulse of 92, respirations 20, blood  pressure is 136/93 and she weighs 270 pounds.  EYES, HOUSE EARS, NOSE AND THROAT:  She appears adequately hydrated.  There  is no cervical or supraclavicular lymphadenopathy.  LUNGS:  Are clear.  CARDIAC:  Normal sinus rhythm.  BREASTS:  Negative.  There is a very large subcostal incision that is well-healed and she is  tender kind of to the right below the umbilicus  about like an appendix.  I  did not do a pelvic and rectal examination is she has had the CT shows the  acute problem.  EXTREMITIES: No pedal edema.  CNS: Is physiologic.   ADMISSION IMPRESSION:  1. Chronic abscess pericecal with abdominal wall with questionable foreign      body or bone anterior to the cecum.  The patient will be started on      Unasyn and prepared for  laparotomy.           ______________________________  Anselm Pancoast. Zachery Dakins, M.D.     WJW/MEDQ  D:  11/09/2005  T:  11/10/2005  Job:  [id number]

## 2010-06-23 NOTE — Discharge Summary (Signed)
Holly Wells               ACCOUNT NO.:  [id number]   MEDICAL RECORD NO.:  [id number]          PATIENT TYPE:  INP   LOCATION:  1612                         FACILITY:  [facility]   PHYSICIAN:  Anselm Pancoast. Weatherly, M.D.DATE OF BIRTH:  1970/02/06   DATE OF ADMISSION:  11/08/2005  DATE OF DISCHARGE:  11/21/2005                                 DISCHARGE SUMMARY   DISCHARGE DIAGNOSES:  1. Chronic abscess, abdominal wall, and also an abscess adjacent to the      cecum, probably a foreign body perforation.  2. Exogenous obesity.  3. Diabetes and hypertension.   OPERATIONS:  Exploratory laparotomy and excision of chronic abscess  immediately adjacent to cecum and drainage of abscess and debridement,  abdominal wall, right lateral abdominal wall.   HISTORY:  Holly Wells is a 40 year old very overweight female, 264 pounds,  she is only about 5 feet 3 inches, who presented to the emergency room at  [facility] with the following history:  She had approximately two weeks  earlier been treated in the [facility] family practice where she is followed for  her diabetes and hypertension and started on a medication for a urinary  tract infection.  She took the medicine as instructed but continued to have  the lower right abdominal pain, and then when the medicine was completed and  she did not improve this next time she returned to the emergency room at  [facility].  She was seen by the ER physician, laboratory studies were  obtained, and they obtained a CT that looked like there was a foreign body  that looked like a fish bone or something anterior to the cecum and a  chronic abscess in the abdominal wall immediately adjacent to the  anterolateral surface of the cecum and also an inflammatory mass around the  cecum between the abdominal wall abscess and the cecum.  I reviewed the CT  and was in agreement that this was an acute inflammatory process.  Her  temperature was 99.3, pulse was 92, and her  white count, however, was 7,100  with hematocrit of 39, and a urinalysis was unremarkable.  Urine pregnancy  was negative, and on physical exam she definitely was tender to palpation at  this area.  It is a little more lateral than the typical appendicitis, and  the area certainly coincides with the area seen on the CAT scan.  An EKG was  obtained, and this was unremarkable.   She was taken to surgery, and Dr. Lurene Shadow assisted because of her size.  She  has had a previous open cholecystectomy through a very generous subcostal  incision, and I made a transverse incision just below the umbilicus, and  upon opening into the abdominal cavity there was an inflammatory mass  immediately adjacent to the cecum that appeared to have a little, not really  a communication, but sort of like a diverticulum, and I think that this is  probably where the area of question on the foreign body which the  radiologist reads probably was something that she ingested and chronically  eroded through the  area.  The abscess, or the mass adjacent to the cecum,  was completely excised.  I did send it over and had it x-rayed to see  whether they could see a foreign body or a bone within it, since that was  what the CT was reported, and they saw no evidence of any bony contents, and  I actually opened the abscess.  The pathologist said this was fibroadipose  tissue with chronic inflammatory changes and hemorrhage.  The cultures  showed organisms consistent with colon organisms but did not grow MRSA.  There were gram-positive cocci and also gram-positive rods, and there was  also an abscess in the abdominal wall that was drained and debrided and the  little sliver of what appeared to be a fish bone possibly was in this, but  neither the radiologist nor the pathologist could identify anything that was  obviously a bone in either of the chronic abscesses.   On admission her glucose was elevated.  She was on Glucophage 1000  mg p.o.  b.i.d., which she reports that she was taking, but when she was in the  emergency room her glucose was 405.  Her serum electrolytes and creatinine  were all normal.  Postoperatively we tried putting her on a sliding scale,  but that was not satisfactory coverage, and we put her on a Glucommander  with insulin coverage.  The actual abscess of the intra-abdominal that was  adjacent to the cecum was completely excised.  The little area where it  communicates was invaginated and closed with Lembert sutures, and then we  did incidental appendectomy.  There were inflammatory cells within the lumen  of the appendix, but she did not have an acute inflammatory process of the  appendix and this was not an appendicitis as the origin of this abscess.  Postoperatively her wound was packed open and she has had wet-to-dry  dressing changes and has had a very difficult time with the wound cleaning  up, and we waited until the day prior to her discharge before doing delayed  closure of wound to make sure that all the inflammatory tissue and  everything was cleaned up and then did a delayed closure of the wound.   Her insulin management, the nurse coordinator has been assisting [place] and they  have recommended that we stop the Glucommander but go with Lantus 20 units  at hour of sleep and that NovoLog be changed to a sliding coverage, but her  glucoses over the last 48 hours have not been in a range where she has  needed coverage, and this past evening, even though she still had an IV at  keep open of dextrose 5, actually had some sugars in the 63 and 83.  Her  glucose this morning was 116.  The patient's wound has been closed, and on  wound check today it appears to be healing nicely.  We switched her to  Augmentin 875 b.i.d., and I will see her in the office for a wound check in  48 hours.  I talked to Dr. Deirdre Peer at family practice, who is her physician, and she recommended that we discharge her on  the Glucophage 1000 mg p.o.  b.i.d. and they will see her tomorrow morning to check her glucose and if  insulin is needed they will coordinate the insulin management.  The patient  appears to be very noncompliant in following any type of diabetic diet here.  Immediately before going home her family brought  her in a double [employer]  fried chicken with double potatoes, biscuits, etc., which obviously is not a  diabetic diet and most likely this has contributed to her 30-pound weight  gain over the last six months that Dr. Deirdre Peer has noted.   I will see her for followup of her abdominal wound and abscesses.  She has  had no problems with food intake and eating during this hospitalization.  We  may need a follow-up CT.  She works as a Technical sales engineer at [employer],  and her abdominal incision will require that she be off work probably about  the next four weeks.  She understands that exercise and diet is very  important in management of her diabetes as well as her weight management.  She is discharged to continue with her hypertension and diabetic management  as she was before her hospitalization, and any changes will be made by the  family practice people.  Her glucoses have been elevated.  Her sugars have  been documented to be high and low, but her blood pressure has never been  elevated during this hospitalization.  A few times when she appeared to be  in pain she would have a systolic pressure of maybe 160, but her systolic  pressures have ranged from 110-150 with the diastolics ranging from 72 to 85  on repeated measurements.  Her temperature curve has been not like that of a  patient with an intra-abdominal abscess, and her white counts have been  basically normal throughout this hospitalization.  Her white count this  morning, on the 17th, was normal.  Her glucose this morning was 116, and her  hematocrit is 33, which is the same as it was preoperatively.            ______________________________  Anselm Pancoast. Zachery Dakins, M.D.     WJW/MEDQ  D:  11/21/2005  T:  11/22/2005  Job:  [id number]   cc:   Drue Dun, M.D.

## 2010-06-23 NOTE — Op Note (Signed)
NAMEFANCY, DUNKLEY               ACCOUNT NO.:  [id number]   MEDICAL RECORD NO.:  [id number]          PATIENT TYPE:  INP   LOCATION:  0098                         FACILITY:  [facility]   PHYSICIAN:  Anselm Pancoast. 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   ANESTHESIA:  General.   SURGEON:  Anselm Pancoast. Zachery Dakins, M.D.   ASSISTANT:  Leonie Man, M.D.   HISTORY:  Holly Wells is a 40 year old very overweight female who  presented to the emergency room with about a two week plus history of  increasing right lower quadrant pain.  She has been treated for what was  thought to be a urinary tract infection and completed the antibiotics but  her symptoms persisted.  She presented to the emergency room today.  She is  adult onset diabetic on metformin and has a history of hypertension.  CT was  performed which shows what looks like an abscess anterior to the cecum and  also what looks like a foreign-body kind of up in the abdominal wall area  just anterior to the inflammation anterior to the cecum.  Patient has had a  previous open cholecystectomy.  Her tenderness is lateral and right below  the umbilicus, like a high lying appendix.  I recommended we start her on  antibiotics and permission obtained for laparotomy open.   The patient positioned on the OR table.  Induction of general anesthesia.  She has PAS stockings and after induction of general anesthesia a Foley  catheter was inserted sterilely.  Abdomen was prepped with Betadine surgical  scrub solution and draped in sterile manner.  I first made a  transverse  incision sort of like an appendix down through the approximately four more  inches of adipose tissue.  Dr. Lurene Shadow assisted with the incision bigger, we  went anterior and laterally.  Then we kind of opened through the abdominal  wall layers and then carefully entered into the peritoneal cavity.  There  was of the inflammatory process that appeared really and the abdominal wall  and then after we made the incision bigger, we could feel also a second  inflammatory mass anterior to the cecum that was probably about 2 inches  higher than the abscess of the abdominal wall.  We made the incision more  laterally going down over this inflammatory mass within the abdominal wall  and entered a frank layer of pus.  This was cultured aerobically and  anaerobically but did not have much odor.  The area we carefully looked to  see if we could see anything looked like a bone in the abscess cavity.  There were some hard areas but nothing that we could definitely recognize as  a bone.  We were expecting from  the CTs to find something like a chicken  bone.  Next, this mass that was kind of intraperitoneally anterior to the  cecum.  It could not be approached through this incision and we opened the  incision medially so that a Thompson retractor could be placed to pull up on  the abdominal wall.  She had had a previous open cholecystectomy and had  some adhesions anterior to the lower edge of the gallbladder and these were  carefully taken down between [facility] and the omental side tied with 2-0  Vicryl.  Then this mass was actually free.  It was very closely adherent to  the cecum but we could not find anything that was obviously a perforation  there was one area that looked like maybe something could have eroded  through the peritoneal anterior surface of the ascending colon and I tied  this with 2-0 Vicryl and then invaginated it sort of like it was a little  diverticulum but I am not sure that  it was really a true perforation.  Then  this abscess that was about like a small fist size, I opened it on the back  table.  There was actually a chronic abscess and then wondered if there was  a foreign body in it.  We had the specimen x-ray but the radiologist said  there is no bony structure in this and this is just a chronic abscess of the  omentum.  There was a second infarcted appendices epiploica on the cecum  that we kind of carefully removed and treated in a similar manner, if it was  a diverticulum and then using 2-0 silk to kind of do a pursestring.  We  found the appendix.  It is in normal position and was not acutely inflamed  and we tied off the base of the appendix with 2-0 Vicryl pursestring suture  of 3-0 silk.  The stump inverted and the pursestring suture tied.  The  appendiceal mesentery had been divided and ligated with 2-0 Vicryl.  The  small-bowel appears unremarkable.  The cecum itself certainly does not feel  to have any type of tumor within it and we elected not to remove anything  else.  The abdominal wall and where she has had this kind of chronic abscess  in the abdominal wall, some of this thickened, inflamed area was debrided  and then I closed and this is the internal oblique and transversalis  laterally with large sutures of #1 Novofil and then closed the external  oblique with #1 Novofil in a second layer.  More medially the tissue was  kind of more normal appearance and this was closed also with figure-of-eight  of #1 Novofil.  The external oblique was closed as a second layer and then  since she had actually had this frank abscess and she is so heavy, we  elected to pack the fatty tissue with Betadine-soaked Kerlix and covered it  with ABD.  I did put a Blake drain laterally, brought up sort of high like  in gallbladder type drain but then ran it down laterally just under the  abdominal wall where she had had this abscess of the abdominal wall.   The patient had an oral tube into the stomach.  We replaced and put it through  the nose and we will keep her Foley catheter and NG tube and keep her on  broad antibiotics.  Not sure the etiology of this but we certainly could not  feel anything else in the abdomen.  It was a difficult exposure and  retraction because her obesity, etc.  Sponge and needle counts were correct  x2.  Estimated blood loss is probably 100 mL.           ______________________________  Anselm Pancoast. Zachery Dakins, M.D.     WJW/MEDQ  D:  11/09/2005  T:  11/10/2005  Job:  [id number]

## 2010-06-30 ENCOUNTER — Other Ambulatory Visit: Payer: PRIVATE HEALTH INSURANCE

## 2010-06-30 ENCOUNTER — Ambulatory Visit: Payer: PRIVATE HEALTH INSURANCE | Admitting: Gynecology

## 2010-07-04 ENCOUNTER — Other Ambulatory Visit: Payer: PRIVATE HEALTH INSURANCE

## 2010-07-04 ENCOUNTER — Ambulatory Visit: Payer: PRIVATE HEALTH INSURANCE | Admitting: Gynecology

## 2010-07-29 ENCOUNTER — Emergency Department (HOSPITAL_BASED_OUTPATIENT_CLINIC_OR_DEPARTMENT_OTHER)
Admission: EM | Admit: 2010-07-29 | Discharge: 2010-07-29 | Disposition: A | Discharge status: Home / Self Care | Payer: PRIVATE HEALTH INSURANCE | Attending: Emergency Medicine | Admitting: Emergency Medicine

## 2010-07-29 ENCOUNTER — Emergency Department (INDEPENDENT_AMBULATORY_CARE_PROVIDER_SITE_OTHER): Payer: PRIVATE HEALTH INSURANCE

## 2010-07-29 DIAGNOSIS — R0602 Shortness of breath: Secondary | ICD-10-CM

## 2010-07-29 DIAGNOSIS — R05 Cough: Secondary | ICD-10-CM

## 2010-07-29 DIAGNOSIS — I10 Essential (primary) hypertension: Secondary | ICD-10-CM | POA: Insufficient documentation

## 2010-07-29 DIAGNOSIS — Z79899 Other long term (current) drug therapy: Secondary | ICD-10-CM | POA: Insufficient documentation

## 2010-07-29 DIAGNOSIS — J329 Chronic sinusitis, unspecified: Secondary | ICD-10-CM | POA: Insufficient documentation

## 2010-07-29 DIAGNOSIS — E119 Type 2 diabetes mellitus without complications: Secondary | ICD-10-CM | POA: Insufficient documentation

## 2010-07-29 DIAGNOSIS — R059 Cough, unspecified: Secondary | ICD-10-CM

## 2010-07-29 DIAGNOSIS — R509 Fever, unspecified: Secondary | ICD-10-CM

## 2010-09-03 ENCOUNTER — Encounter: Payer: Self-pay | Admitting: *Deleted

## 2010-09-03 ENCOUNTER — Emergency Department (HOSPITAL_BASED_OUTPATIENT_CLINIC_OR_DEPARTMENT_OTHER)
Admission: EM | Admit: 2010-09-03 | Discharge: 2010-09-03 | Disposition: A | Discharge status: Home / Self Care | Payer: PRIVATE HEALTH INSURANCE | Attending: Emergency Medicine | Admitting: Emergency Medicine

## 2010-09-03 ENCOUNTER — Other Ambulatory Visit: Payer: Self-pay

## 2010-09-03 DIAGNOSIS — I951 Orthostatic hypotension: Secondary | ICD-10-CM | POA: Insufficient documentation

## 2010-09-03 DIAGNOSIS — R5381 Other malaise: Secondary | ICD-10-CM | POA: Insufficient documentation

## 2010-09-03 DIAGNOSIS — I10 Essential (primary) hypertension: Secondary | ICD-10-CM | POA: Insufficient documentation

## 2010-09-03 DIAGNOSIS — E119 Type 2 diabetes mellitus without complications: Secondary | ICD-10-CM | POA: Insufficient documentation

## 2010-09-03 DIAGNOSIS — R5383 Other fatigue: Secondary | ICD-10-CM | POA: Insufficient documentation

## 2010-09-03 DIAGNOSIS — F411 Generalized anxiety disorder: Secondary | ICD-10-CM | POA: Insufficient documentation

## 2010-09-03 HISTORY — DX: Essential (primary) hypertension: I10

## 2010-09-03 HISTORY — DX: Anxiety disorder, unspecified: F41.9

## 2010-09-03 LAB — URINE MICROSCOPIC-ADD ON

## 2010-09-03 LAB — BASIC METABOLIC PANEL
BUN: 20 mg/dL (ref 6–23)
CO2: 24 mEq/L (ref 19–32)
GFR calc non Af Amer: >60 mL/min (ref >60)
Glucose, Bld: 148 mg/dL — ABNORMAL HIGH (ref 70–99)
Potassium: 3.9 mEq/L (ref 3.5–5.1)

## 2010-09-03 LAB — URINALYSIS, ROUTINE W REFLEX MICROSCOPIC
Bilirubin Urine: NEGATIVE
Glucose, UA: NEGATIVE mg/dL
Protein, ur: NEGATIVE mg/dL
Urobilinogen, UA: 0.2 mg/dL (ref 0.0–1.0)

## 2010-09-03 LAB — HEPATIC FUNCTION PANEL
ALT: 22 U/L (ref 0–35)
Albumin: 3.6 g/dL (ref 3.5–5.2)
Alkaline Phosphatase: 87 U/L (ref 39–117)
Total Bilirubin: 0.1 mg/dL — ABNORMAL LOW (ref 0.3–1.2)
Total Protein: 8.1 g/dL (ref 6.0–8.3)

## 2010-09-03 LAB — PREGNANCY, URINE: Preg Test, Ur: NEGATIVE

## 2010-09-03 MED ORDER — SODIUM CHLORIDE 0.9 % IV BOLUS (SEPSIS)
1000.0000 mL | Freq: Once | INTRAVENOUS | Status: AC
  Administered 2010-09-03 (×2): 1000 mL via INTRAVENOUS

## 2010-09-03 MED ORDER — ONDANSETRON HCL 4 MG/2ML IJ SOLN
4.0000 mg | Freq: Once | INTRAMUSCULAR | Status: AC
  Administered 2010-09-03: 4 mg via INTRAVENOUS
  Filled 2010-09-03: qty 2

## 2010-09-03 NOTE — ED Provider Notes (Signed)
History     Chief Complaint  Patient presents with  . Weakness   HPI Comments: Weak and "feeling dehydrated" since this morning.  Awoke feeling lightheaded with a couple episodes of diarrhea.  Admits to heavy alcohol use last night.  Nausea but no vomiting or abdominal pain.  No chest pain or SOB.  Unable to quatify how much she drank.  "I've felt this way before when I've been dehydrated".  Feels more lightheaded with position changes.    Past Medical History  Diagnosis Date  . Diabetes mellitus   . Hypertension   . Anxiety     Past Surgical History  Procedure Date  . Cholecystectomy   . Appendectomy   . Tonsillectomy     No family history on file.  History  Substance Use Topics  . Smoking status: Never Smoker   . Smokeless tobacco: Not on file  . Alcohol Use: Yes     social    OB History    Grav Para Term Preterm Abortions TAB SAB Ect Mult Living                  Review of Systems  Constitutional: Positive for activity change, appetite change and fatigue. Negative for fever.  HENT: Negative for congestion and rhinorrhea.   Eyes: Negative for visual disturbance.  Respiratory: Negative for choking, chest tightness and shortness of breath.   Cardiovascular: Negative for chest pain.  Gastrointestinal: Positive for nausea and diarrhea. Negative for vomiting and abdominal pain.  Genitourinary: Negative for dysuria and hematuria.  Musculoskeletal: Positive for myalgias and arthralgias.  Neurological: Positive for light-headedness and headaches. Negative for syncope and weakness.    Physical Exam  BP 113/79  Pulse 106  Temp(Src) 98.1 F (36.7 C) (Oral)  Resp 20  SpO2 100%  Physical Exam  Constitutional: She is oriented to person, place, and time. She appears well-developed and well-nourished.  HENT:  Head: Normocephalic and atraumatic.  Mouth/Throat: Oropharynx is clear and moist. No oropharyngeal exudate.  Eyes: Conjunctivae are normal. Pupils are equal,  round, and reactive to light.  Neck: Normal range of motion.  Cardiovascular: Normal rate, regular rhythm and normal heart sounds.   Pulmonary/Chest: Effort normal and breath sounds normal. No respiratory distress.  Abdominal: Soft. There is no tenderness. There is no rebound and no guarding.  Musculoskeletal: Normal range of motion. She exhibits no edema and no tenderness.  Neurological: She is alert and oriented to person, place, and time. No cranial nerve deficit.  Skin: Skin is warm.    ED Course  Procedures  MDM Weakness and lightheadedness after binge drinking last night.  Tachycardic here. Check orthostatic vitals, EKG, chem7, UA, IVF.  Orthostatic vitals +.  Given IVF here. Patient reports feeling much better and requesting discharge. Will not treat asymptomatic bacteruria.  EKG: normal EKG, normal sinus rhythm, unchanged from previous tracings, normal sinus rhythm, rate 83, normal axis.  Results for orders placed during the hospital encounter of 09/03/10  URINALYSIS, ROUTINE W REFLEX MICROSCOPIC      Component Value Range   Color, Urine YELLOW  YELLOW    Appearance CLEAR  CLEAR    Specific Gravity, Urine 1.023  1.005 - 1.030    pH 5.0  5.0 - 8.0    Glucose, UA NEGATIVE  NEGATIVE (mg/dL)   Hgb urine dipstick TRACE (*) NEGATIVE    Bilirubin Urine NEGATIVE  NEGATIVE    Ketones, ur NEGATIVE  NEGATIVE (mg/dL)   Protein, ur NEGATIVE  NEGATIVE (  mg/dL)   Urobilinogen, UA 0.2  0.0 - 1.0 (mg/dL)   Nitrite NEGATIVE  NEGATIVE    Leukocytes, UA NEGATIVE  NEGATIVE   BASIC METABOLIC PANEL      Component Value Range   Sodium 137  135 - 145 (mEq/L)   Potassium 3.9  3.5 - 5.1 (mEq/L)   Chloride 99  96 - 112 (mEq/L)   CO2 24  19 - 32 (mEq/L)   Glucose, Bld 148 (*) 70 - 99 (mg/dL)   BUN 20  6 - 23 (mg/dL)   Creatinine, Ser 4.09  0.50 - 1.10 (mg/dL)   Calcium 81.1  8.4 - 10.5 (mg/dL)   GFR calc non Af Amer >60  >60 (mL/min)   GFR calc Af Amer >60  >60 (mL/min)  LIPASE, BLOOD       Component Value Range   Lipase 31  11 - 59 (U/L)  HEPATIC FUNCTION PANEL      Component Value Range   Total Protein 8.1  6.0 - 8.3 (g/dL)   Albumin 3.6  3.5 - 5.2 (g/dL)   AST 17  0 - 37 (U/L)   ALT 22  0 - 35 (U/L)   Alkaline Phosphatase 87  39 - 117 (U/L)   Total Bilirubin 0.1 (*) 0.3 - 1.2 (mg/dL)   Bilirubin, Direct PENDING  0.0 - 0.3 (mg/dL)   Indirect Bilirubin PENDING  0.3 - 0.9 (mg/dL)  PREGNANCY, URINE      Component Value Range   Preg Test, Ur NEGATIVE    URINE MICROSCOPIC-ADD ON      Component Value Range   Squamous Epithelial / LPF RARE  RARE    WBC, UA 0-2  <3 (WBC/hpf)   RBC / HPF 0-2  <3 (RBC/hpf)   Bacteria, UA MANY (*) RARE    Casts HYALINE CASTS (*) NEGATIVE    Urine-Other MUCOUS PRESENT     No results found.   Glynn Octave, MD 09/03/10 [id number]

## 2010-09-03 NOTE — ED Notes (Signed)
Tired since drinking alcohol last pm. Hx of diabetes. Accucheck 131. Alert ambulatory.

## 2010-09-06 DIAGNOSIS — N839 Noninflammatory disorder of ovary, fallopian tube and broad ligament, unspecified: Secondary | ICD-10-CM

## 2010-09-06 HISTORY — DX: Noninflammatory disorder of ovary, fallopian tube and broad ligament, unspecified: N83.9

## 2010-09-11 ENCOUNTER — Encounter: Payer: Self-pay | Admitting: Gynecology

## 2010-09-11 ENCOUNTER — Ambulatory Visit (INDEPENDENT_AMBULATORY_CARE_PROVIDER_SITE_OTHER): Payer: PRIVATE HEALTH INSURANCE | Admitting: Gynecology

## 2010-09-11 VITALS — BP 118/82

## 2010-09-11 DIAGNOSIS — N83209 Unspecified ovarian cyst, unspecified side: Secondary | ICD-10-CM

## 2010-09-11 DIAGNOSIS — N979 Female infertility, unspecified: Secondary | ICD-10-CM

## 2010-09-11 MED ORDER — DOXYCYCLINE HYCLATE 100 MG PO CAPS: 100.0000 mg | ORAL_CAPSULE | Freq: Two times a day (BID) | ORAL | Status: AC

## 2010-09-11 NOTE — Progress Notes (Signed)
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 She has a history of attempted postpartum tubal sterilization were there is some question as to whether they accomplish this.  She saysthey called her post operatively that they could not get one of her tubes because of scarring. Unfortunately we attempted to get this operative report and it has been destroyed and unavailable.  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 She was referred to [facility] for consideration of surgery given a history of chronic right lower quadrant abscess with debridement in 2007. Her consult there they prefer just to watch her expectantly given the increased risk of surgery associated with her history of adhesive disease.  I reviewed her situation and I think we'll start with HSG to check her tubal status and finally lay this to rest as to whether she has open or closed tubes. She again would like to become pregnant and if her tubes are blocked, she would have to elect for in vitro. I doubt that people would consider a tubal reanastomosis given her history. I discussed the risks of HSG to include infection and reaction to the dye and I prescribed Vibramycin 100 mg to start the night before surgery twice a day x5 days. She will go ahead and make arrangements for the HSG.  Given her history of ovarian cysts we'll go ahead and re ultrasound her now re look at her right and left ovaries and then go from there.

## 2010-09-14 ENCOUNTER — Telehealth: Payer: Self-pay | Admitting: *Deleted

## 2010-09-14 NOTE — Telephone Encounter (Signed)
HSG COULD NOT BE SCHEDULED FOR PT DUE HER LMP:09/01/10. PT INSTRUCTED TO WAIT UNTIL FIRST DAY PERIOD FOR AUGUST AND TO CALL OFFICE BACK TO SCHEDULE HSG AT [facility].

## 2010-09-20 ENCOUNTER — Emergency Department (HOSPITAL_BASED_OUTPATIENT_CLINIC_OR_DEPARTMENT_OTHER)
Admission: EM | Admit: 2010-09-20 | Discharge: 2010-09-20 | Disposition: A | Discharge status: Home / Self Care | Payer: PRIVATE HEALTH INSURANCE | Attending: Emergency Medicine | Admitting: Emergency Medicine

## 2010-09-20 ENCOUNTER — Encounter (HOSPITAL_BASED_OUTPATIENT_CLINIC_OR_DEPARTMENT_OTHER): Payer: Self-pay | Admitting: *Deleted

## 2010-09-20 DIAGNOSIS — R109 Unspecified abdominal pain: Secondary | ICD-10-CM | POA: Insufficient documentation

## 2010-09-20 DIAGNOSIS — I10 Essential (primary) hypertension: Secondary | ICD-10-CM | POA: Insufficient documentation

## 2010-09-20 DIAGNOSIS — N83209 Unspecified ovarian cyst, unspecified side: Secondary | ICD-10-CM | POA: Insufficient documentation

## 2010-09-20 DIAGNOSIS — E119 Type 2 diabetes mellitus without complications: Secondary | ICD-10-CM | POA: Insufficient documentation

## 2010-09-20 LAB — URINALYSIS, ROUTINE W REFLEX MICROSCOPIC
Glucose, UA: 100 mg/dL — AB
Ketones, ur: NEGATIVE mg/dL
Leukocytes, UA: NEGATIVE
Nitrite: NEGATIVE
Protein, ur: NEGATIVE mg/dL
Urobilinogen, UA: 0.2 mg/dL (ref 0.0–1.0)

## 2010-09-20 LAB — PREGNANCY, URINE: Preg Test, Ur: NEGATIVE

## 2010-09-20 MED ORDER — KETOROLAC TROMETHAMINE 60 MG/2ML IM SOLN
60.0000 mg | Freq: Once | INTRAMUSCULAR | Status: AC
  Administered 2010-09-20: 60 mg via INTRAMUSCULAR
  Filled 2010-09-20: qty 2

## 2010-09-20 NOTE — ED Provider Notes (Signed)
History     CSN: [id number] Arrival date & time: 09/20/2010  3:58 PM  Chief Complaint  Patient presents with  . Abdominal Pain   Patient is a 40 y.o. female presenting with abdominal pain. The history is provided by the patient.  Abdominal Pain The primary symptoms of the illness include abdominal pain. The primary symptoms of the illness do not include fever, fatigue, nausea, vomiting, diarrhea, dysuria or vaginal discharge. The current episode started more than 2 days ago. Onset quality: Patient has history of ovarian cysts and this is like prior episodes.  She was seen by Dr. Audie Box and she called for more viicodin but told he would not prescribe more.  The patient states that she believes she is currently not pregnant. The patient has not had a change in bowel habit. Symptoms associated with the illness do not include chills, anorexia, urgency or hematuria.    Past Medical History  Diagnosis Date  . Diabetes mellitus   . Hypertension   . Anxiety     Past Surgical History  Procedure Date  . Cholecystectomy   . Appendectomy   . Tonsillectomy     History reviewed. No pertinent family history.  History  Substance Use Topics  . Smoking status: Former Games developer  . Smokeless tobacco: Never Used  . Alcohol Use: Yes     social    OB History    Grav Para Term Preterm Abortions TAB SAB Ect Mult Living                  Review of Systems  Constitutional: Negative for fever, chills and fatigue.  Gastrointestinal: Positive for abdominal pain. Negative for nausea, vomiting, diarrhea and anorexia.  Genitourinary: Negative for dysuria, urgency, hematuria and vaginal discharge.  All other systems reviewed and are negative.    Physical Exam  BP 129/76  Pulse 66  Temp(Src) 97.7 F (36.5 C) (Oral)  Resp 16  Wt 220 lb (99.791 kg)  SpO2 98%  LMP 09/08/2010  Physical Exam  Constitutional: She is oriented to person, place, and time. She appears well-developed and  well-nourished.  HENT:  Head: Normocephalic and atraumatic.  Eyes: Pupils are equal, round, and reactive to light.  Neck: Normal range of motion.  Cardiovascular: Normal rate.   Pulmonary/Chest: Effort normal and breath sounds normal.  Abdominal: Soft. Bowel sounds are normal. She exhibits no mass. There is tenderness. There is no rebound and no guarding.  Musculoskeletal: Normal range of motion.  Neurological: She is alert and oriented to person, place, and time. She has normal reflexes.  Skin: Skin is warm and dry.    ED Course  Procedures  MDM Plan toradol and advised to follow up with gynecologist.  Patient states symptoms like multiple prior ovarian cysts.  She has had an appendectomy and cholecystectomy and is scheduled for follow up with her gyn.      Hilario Quarry, MD 09/20/10 [phone]

## 2010-09-20 NOTE — ED Notes (Signed)
Pt brought in by EMS  For right lower abd pain x 1 day , Hx ovarian cyst

## 2010-09-21 ENCOUNTER — Telehealth: Payer: Self-pay | Admitting: *Deleted

## 2010-09-21 DIAGNOSIS — R102 Pelvic and perineal pain: Secondary | ICD-10-CM

## 2010-09-21 MED ORDER — TRAMADOL HCL 50 MG PO TABS: 50.0000 mg | ORAL_TABLET | Freq: Four times a day (QID) | ORAL | Status: AC | PRN

## 2010-09-21 NOTE — Telephone Encounter (Signed)
We can go ahead and try Ultram 50 mg one by mouth every 6 hours as needed for pain #30 no refill and I called this in

## 2010-09-21 NOTE — Telephone Encounter (Signed)
Note restarted,  Patient called and went to [facility] yesterday due to increased pain with the ovarian cyst you diagnosed her with. See notes in chart review. She has a follow up US here 10/23/10.  She is asking for more pain meds. Do you want to see her now or please advise a medication. Thanks

## 2010-09-21 NOTE — Telephone Encounter (Signed)
Patient advised rx escribed to pharmacy.

## 2010-09-21 NOTE — Telephone Encounter (Signed)
Patient called, went to [facility] in [facility] yesterday due to increased pain with th

## 2010-09-29 ENCOUNTER — Telehealth: Payer: Self-pay | Admitting: *Deleted

## 2010-09-29 NOTE — Telephone Encounter (Signed)
PT CALLED TO SCHEDULE HSG APPOINTMENT AT [facility]. LMP: TODAY. PT INFORMED NO SEX AND TO BE AT [facility] 10/05/10 @ 8:00AM.

## 2010-10-05 ENCOUNTER — Ambulatory Visit (HOSPITAL_COMMUNITY)
Admission: RE | Admit: 2010-10-05 | Discharge: 2010-10-05 | Disposition: A | Discharge status: Home / Self Care | Payer: PRIVATE HEALTH INSURANCE | Source: Ambulatory Visit | Attending: Gynecology | Admitting: Gynecology

## 2010-10-05 DIAGNOSIS — N979 Female infertility, unspecified: Secondary | ICD-10-CM | POA: Insufficient documentation

## 2010-10-05 MED ORDER — IOHEXOL 300 MG/ML  SOLN: 15.0000 mL | Freq: Once | INTRAMUSCULAR | Status: AC | PRN

## 2010-10-23 ENCOUNTER — Encounter: Payer: Self-pay | Admitting: Gynecology

## 2010-10-23 ENCOUNTER — Other Ambulatory Visit: Payer: Self-pay | Admitting: Gynecology

## 2010-10-23 ENCOUNTER — Ambulatory Visit (INDEPENDENT_AMBULATORY_CARE_PROVIDER_SITE_OTHER): Payer: PRIVATE HEALTH INSURANCE | Admitting: Gynecology

## 2010-10-23 ENCOUNTER — Ambulatory Visit: Admission: RE | Admit: 2010-10-23 | Payer: PRIVATE HEALTH INSURANCE | Source: Ambulatory Visit

## 2010-10-23 DIAGNOSIS — R102 Pelvic and perineal pain: Secondary | ICD-10-CM

## 2010-10-23 DIAGNOSIS — N83209 Unspecified ovarian cyst, unspecified side: Secondary | ICD-10-CM

## 2010-10-23 DIAGNOSIS — N949 Unspecified condition associated with female genital organs and menstrual cycle: Secondary | ICD-10-CM

## 2010-10-23 NOTE — Progress Notes (Signed)
Patient presents in followup ultrasound to 2 history of bilateral ovarian cysts in the past. Her ultrasound again today shows endometrial echo 11.8 mm avascular cystic mass in the right ovary is 51 mm left ovary with a reticular echo pattern cyst of 43 mm. This is essentially unchanged since her prior ultrasound of March 2012. She was referred to [facility] for consideration of surgery and due to her past history of abdominal surgeries there were not interested in operating on her at this time and recommended pain medicine observation. She continues to have some pain and uses Ultram. 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  I reviewed all this with her in particular that she is at risk of achieving pregnancy which is what she desires. She is on lisinopril I discussed with her that this is considered tried genetic and that she really should consider switching to a more pregnancy friendly and antihypertensive if indeed she wants to pursue pregnancy she is going to make an appointment with her primary to discuss this. Continuation of a multivitamin with folic acid reviewed. I ordered a CA 125 today given the persistence of her cystic masses and I discussed whether she would want to have a second surgical opinion as she was not impressed with the discussion of [facility] and she wants to go ahead and arrange this.   I will set this up through gyn oncology to get a second opinion as far as management in her case. Patient agrees with this we'll make that arrangement.

## 2010-11-07 ENCOUNTER — Encounter (HOSPITAL_BASED_OUTPATIENT_CLINIC_OR_DEPARTMENT_OTHER): Payer: Self-pay | Admitting: *Deleted

## 2010-11-07 ENCOUNTER — Emergency Department (HOSPITAL_BASED_OUTPATIENT_CLINIC_OR_DEPARTMENT_OTHER)
Admission: EM | Admit: 2010-11-07 | Discharge: 2010-11-08 | Disposition: A | Discharge status: Home / Self Care | Payer: PRIVATE HEALTH INSURANCE | Attending: Emergency Medicine | Admitting: Emergency Medicine

## 2010-11-07 DIAGNOSIS — Z79899 Other long term (current) drug therapy: Secondary | ICD-10-CM | POA: Insufficient documentation

## 2010-11-07 DIAGNOSIS — F411 Generalized anxiety disorder: Secondary | ICD-10-CM | POA: Insufficient documentation

## 2010-11-07 DIAGNOSIS — E119 Type 2 diabetes mellitus without complications: Secondary | ICD-10-CM | POA: Insufficient documentation

## 2010-11-07 DIAGNOSIS — F4321 Adjustment disorder with depressed mood: Secondary | ICD-10-CM | POA: Insufficient documentation

## 2010-11-07 DIAGNOSIS — I10 Essential (primary) hypertension: Secondary | ICD-10-CM | POA: Insufficient documentation

## 2010-11-07 HISTORY — DX: Benign neoplasm of connective and other soft tissue, unspecified: D21.9

## 2010-11-07 NOTE — ED Notes (Signed)
Pt reports having a panic attack after driving home from work tonight. States she began hyperventilating, became dizzy and felt like she was going to pass out. Pt was tearful, and was assisted by a neighbor. Pt states she has had to deal with 3 deaths from close relationships in the past three days, has had very little sleep, and is working two jobs full time. States she feels like she needs something to help her deal with the stress that she has had of late.

## 2010-11-07 NOTE — ED Notes (Signed)
Pt c/o increased anxiety since last week, worsened tonight. Pt began feeling dizzy with increased stress.

## 2010-11-08 ENCOUNTER — Telehealth: Payer: Self-pay | Admitting: *Deleted

## 2010-11-08 LAB — BASIC METABOLIC PANEL
CO2: 28
Calcium: 9
Chloride: 99
GFR calc Af Amer: >60
Potassium: 3.8
Sodium: 136

## 2010-11-08 LAB — URINALYSIS, ROUTINE W REFLEX MICROSCOPIC
Glucose, UA: 500 — AB
Protein, ur: NEGATIVE
Specific Gravity, Urine: 1.029
pH: 5.5

## 2010-11-08 LAB — DIFFERENTIAL
Basophils Relative: 0
Lymphocytes Relative: 27
Lymphs Abs: 2.8
Monocytes Relative: 7
Neutro Abs: 6.9
Neutrophils Relative %: 65

## 2010-11-08 LAB — CBC
RBC: 4.22
WBC: 10.6 — ABNORMAL HIGH

## 2010-11-08 MED ORDER — HYDROXYZINE PAMOATE 50 MG PO CAPS: 50.0000 mg | ORAL_CAPSULE | Freq: Every day | ORAL | Status: AC

## 2010-11-08 MED ORDER — LORAZEPAM 1 MG PO TABS
1.0000 mg | ORAL_TABLET | Freq: Once | ORAL | Status: AC
  Administered 2010-11-08: 1 mg via ORAL
  Filled 2010-11-08: qty 1

## 2010-11-08 NOTE — ED Provider Notes (Signed)
History     CSN: [id number] Arrival date & time: 11/07/2010 11:35 PM  Chief Complaint  Patient presents with  . Anxiety    (Consider location/radiation/quality/duration/timing/severity/associated sxs/prior treatment) Patient is a 40 y.o. female presenting with anxiety. The history is provided by the patient.  Anxiety This is a new problem. The current episode started 2 days ago. The problem occurs constantly. The problem has been gradually worsening. Pertinent negatives include no chest pain, no abdominal pain, no headaches and no shortness of breath.  multiple recent deaths in family, and with a co worker and PT is having trouble sleeping and dealing with this, she is here with another family member and has multiple supports at home, she works 2 jobs and tonight is requesting something to help her sleep, she has remote h/o depression, denies in any SI/ HI. No drug or alcohol use. No thoughts of harming herself.  Past Medical History  Diagnosis Date  . Diabetes mellitus   . Hypertension   . Anxiety   . Fibroid tumor     Past Surgical History  Procedure Date  . Cholecystectomy   . Appendectomy   . Tonsillectomy     No family history on file.  History  Substance Use Topics  . Smoking status: Former Games developer  . Smokeless tobacco: Never Used  . Alcohol Use: Yes     social    OB History    Grav Para Term Preterm Abortions TAB SAB Ect Mult Living                  Review of Systems  Constitutional: Negative for fever and chills.  HENT: Negative for neck pain and neck stiffness.   Eyes: Negative for pain.  Respiratory: Negative for shortness of breath.   Cardiovascular: Negative for chest pain and palpitations.  Gastrointestinal: Negative for abdominal pain.  Genitourinary: Negative for dysuria.  Musculoskeletal: Negative for back pain.  Skin: Negative for rash.  Neurological: Negative for headaches.  Psychiatric/Behavioral: Positive for sleep disturbance. Negative for  suicidal ideas, hallucinations and self-injury.  All other systems reviewed and are negative.    Allergies  Review of patient's allergies indicates no known allergies.  Home Medications   Current Outpatient Rx  Name Route Sig Dispense Refill  . VITAMIN C 100 MG PO TABS Oral Take 100 mg by mouth daily.      . B-COMPLEX/B-12 PO Oral Take by mouth.      Marland Kitchen VICODIN PO Oral Take by mouth. AS NEEDED FOR PAIN     . LISINOPRIL-HYDROCHLOROTHIAZIDE 20-25 MG PO TABS Oral Take 1 tablet by mouth daily.      Marland Kitchen METFORMIN HCL 1000 MG PO TABS Oral Take 1,000 mg by mouth 2 (two) times daily.      Marland Kitchen ONE-DAILY MULTI VITAMINS PO TABS Oral Take 1 tablet by mouth daily. CENTRUM     . CEPHALEXIN 500 MG PO CAPS Oral Take 1,000 mg by mouth 2 (two) times daily.      . CEPHALEXIN PO Oral Take by mouth.      Marland Kitchen HYDROCHLOROTHIAZIDE PO Oral Take by mouth.      Marland Kitchen LISINOPRIL PO Oral Take by mouth.      . METFORMIN HCL PO Oral Take by mouth.        BP 125/81  Pulse 86  Temp(Src) 97.7 F (36.5 C) (Oral)  Resp 17  Ht 5\' 5"  (1.651 m)  Wt 220 lb (99.791 kg)  BMI 36.61 kg/m2  SpO2 98%  Physical Exam  Constitutional: She is oriented to person, place, and time. She appears well-developed and well-nourished.  HENT:  Head: Normocephalic and atraumatic.  Eyes: Conjunctivae and EOM are normal. Pupils are equal, round, and reactive to light.  Neck: Full passive range of motion without pain. Neck supple. No thyromegaly present.       No meningismus  Cardiovascular: Normal rate, regular rhythm, S1 normal, S2 normal and intact distal pulses.   Pulmonary/Chest: Effort normal and breath sounds normal.  Abdominal: Soft. Bowel sounds are normal. There is no tenderness. There is no CVA tenderness.  Musculoskeletal: Normal range of motion.  Neurological: She is alert and oriented to person, place, and time. She has normal strength and normal reflexes. No cranial nerve deficit or sensory deficit. She displays a negative Romberg  sign. GCS eye subscore is 4. GCS verbal subscore is 5. GCS motor subscore is 6.       Normal Gait  Skin: Skin is warm and dry. No rash noted. No cyanosis. Nails show no clubbing.  Psychiatric: Her speech is normal and behavior is normal. Judgment normal. Cognition and memory are normal. She expresses no homicidal and no suicidal ideation.    ED Course  Procedures (including critical care time)  Labs Reviewed - No data to display No results found.   No diagnosis found.    MDM   Patient presenting with difficulty sleeping after multiple recent stressors in her life. She denies any suicidal or homicidal ideation and by clinical evaluation is having grief reaction. By mouth Ativan provided in the emergency department and a prescription for Vistaril to take at bedtime was prescribed. Patient has primary care followup and was also given referral to behavioral health as needed. She is stable for discharge home. No symptoms of ACS, intoxication, or metabolic issues otherwise. Normal neurologic exam.        Sunnie Nielsen, MD 11/08/10 [phone]

## 2010-11-08 NOTE — Telephone Encounter (Signed)
Called patient to tell her that Dr. Loree Fee looked at her records and did not recommend doing surgery for her.  He did recommend sending her to Dr. Ceasar Lund [phone] (partner with Dr. Fredrich Birks) in [place].  Patient said she is ok with not doing anything at this time.  She realizes that surgery is not necessary at this time and she will call the office if she decides to go to the pelvic pain clinic.

## 2010-11-15 ENCOUNTER — Emergency Department (INDEPENDENT_AMBULATORY_CARE_PROVIDER_SITE_OTHER): Payer: PRIVATE HEALTH INSURANCE

## 2010-11-15 ENCOUNTER — Encounter (HOSPITAL_BASED_OUTPATIENT_CLINIC_OR_DEPARTMENT_OTHER): Payer: Self-pay

## 2010-11-15 ENCOUNTER — Emergency Department (HOSPITAL_BASED_OUTPATIENT_CLINIC_OR_DEPARTMENT_OTHER)
Admission: EM | Admit: 2010-11-15 | Discharge: 2010-11-15 | Disposition: A | Discharge status: Home / Self Care | Payer: PRIVATE HEALTH INSURANCE | Attending: Emergency Medicine | Admitting: Emergency Medicine

## 2010-11-15 DIAGNOSIS — I10 Essential (primary) hypertension: Secondary | ICD-10-CM | POA: Insufficient documentation

## 2010-11-15 DIAGNOSIS — E119 Type 2 diabetes mellitus without complications: Secondary | ICD-10-CM | POA: Insufficient documentation

## 2010-11-15 DIAGNOSIS — T189XXA Foreign body of alimentary tract, part unspecified, initial encounter: Secondary | ICD-10-CM | POA: Insufficient documentation

## 2010-11-15 DIAGNOSIS — Y9289 Other specified places as the place of occurrence of the external cause: Secondary | ICD-10-CM | POA: Insufficient documentation

## 2010-11-15 DIAGNOSIS — R6889 Other general symptoms and signs: Secondary | ICD-10-CM

## 2010-11-15 DIAGNOSIS — X58XXXA Exposure to other specified factors, initial encounter: Secondary | ICD-10-CM | POA: Insufficient documentation

## 2010-11-15 DIAGNOSIS — Z79899 Other long term (current) drug therapy: Secondary | ICD-10-CM | POA: Insufficient documentation

## 2010-11-15 NOTE — ED Notes (Signed)
Choked on [place] sausage last night-states she feels like it is lodged right chest-is handling own secretions-c/o dizzness wheh attempted to eat today-PCP was advised over the phone to come to ED-pt NAD

## 2010-11-15 NOTE — ED Provider Notes (Signed)
History     CSN: [id number] Arrival date & time: 11/15/2010  1:52 PM  Chief Complaint  Patient presents with  . Aspiration    (Consider location/radiation/quality/duration/timing/severity/associated sxs/prior treatment) HPI Comments: Pt states that she was eating [place] sausage and it felt like it got stuck and then aspirated it last night:pt states that she feels like it is in the right side of her chest:pt states that she was able to eat this morning without any problem  Patient is a 40 y.o. female presenting with foreign body. The history is provided by the patient. No language interpreter was used.  Foreign Body  The current episode started 12 to 24 hours ago. The foreign body is suspected to be aspirated. The foreign body is food. Pertinent negatives include no chest pain, no congestion, no drainage, no drooling, no trouble swallowing and no difficulty breathing. She has been behaving normally.    Past Medical History  Diagnosis Date  . Diabetes mellitus   . Hypertension   . Anxiety   . Fibroid tumor     Past Surgical History  Procedure Date  . Cholecystectomy   . Appendectomy   . Tonsillectomy     No family history on file.  History  Substance Use Topics  . Smoking status: Former Games developer  . Smokeless tobacco: Never Used  . Alcohol Use: Yes     social    OB History    Grav Para Term Preterm Abortions TAB SAB Ect Mult Living                  Review of Systems  HENT: Negative for congestion, drooling and trouble swallowing.   Cardiovascular: Negative for chest pain.  All other systems reviewed and are negative.    Allergies  Review of patient's allergies indicates no known allergies.  Home Medications   Current Outpatient Rx  Name Route Sig Dispense Refill  . VITAMIN C 100 MG PO TABS Oral Take 100 mg by mouth daily.      . B-COMPLEX/B-12 PO Oral Take by mouth.      . CEPHALEXIN 500 MG PO CAPS Oral Take 1,000 mg by mouth 2 (two) times daily.      .  CEPHALEXIN PO Oral Take by mouth.      Marland Kitchen HYDROCHLOROTHIAZIDE PO Oral Take by mouth.      Marland Kitchen VICODIN PO Oral Take by mouth. AS NEEDED FOR PAIN     . HYDROXYZINE PAMOATE 50 MG PO CAPS Oral Take 1 capsule (50 mg total) by mouth at bedtime. 30 capsule 0  . LISINOPRIL PO Oral Take by mouth.      Marland Kitchen LISINOPRIL-HYDROCHLOROTHIAZIDE 20-25 MG PO TABS Oral Take 1 tablet by mouth daily.      Marland Kitchen METFORMIN HCL 1000 MG PO TABS Oral Take 1,000 mg by mouth 2 (two) times daily.      Marland Kitchen METFORMIN HCL PO Oral Take by mouth.      . ONE-DAILY MULTI VITAMINS PO TABS Oral Take 1 tablet by mouth daily. CENTRUM       BP 122/87  Pulse 108  Temp(Src) 98.2 F (36.8 C) (Oral)  Resp 18  Ht 5\' 5"  (1.651 m)  Wt 220 lb (99.791 kg)  BMI 36.61 kg/m2  SpO2 98%  LMP 10/26/2010  Physical Exam  Nursing note and vitals reviewed. Constitutional: She is oriented to person, place, and time. She appears well-developed and well-nourished.  HENT:  Head: Normocephalic and atraumatic.  Neck: Normal range of motion.  Neck supple.  Cardiovascular: Normal rate and regular rhythm.   Pulmonary/Chest: Effort normal and breath sounds normal.  Musculoskeletal: Normal range of motion.  Neurological: She is alert and oriented to person, place, and time.  Skin: Skin is warm and dry.  Psychiatric: She has a normal mood and affect.    ED Course  Procedures (including critical care time)  Labs Reviewed - No data to display Dg Chest 2 View  11/15/2010  *RADIOLOGY REPORT*  Clinical Data: Choking.  Question aspiration.  CHEST - 2 VIEW  Comparison: 11/16/2009  Findings: Heart and mediastinal contours are within normal limits. No focal opacities or effusions.  No acute bony abnormality.  IMPRESSION: No active disease.  Original Report Authenticated By: Cyndie Chime, M.D.     No diagnosis found.    MDM  No acute findings and pt is tolerating po and saliva without any problem   Medical screening examination/treatment/procedure(s) were  performed by non-physician practitioner and as supervising physician I was immediately available for consultation/collaboration.     Teressa Lower, NP 11/15/10 1502  Suzi Roots, MD 11/25/10 [phone]

## 2011-01-03 ENCOUNTER — Other Ambulatory Visit: Payer: Self-pay | Admitting: *Deleted

## 2011-01-03 MED ORDER — TRAMADOL HCL 50 MG PO TABS: 50.0000 mg | ORAL_TABLET | Freq: Four times a day (QID) | ORAL | Status: AC | PRN

## 2011-01-10 ENCOUNTER — Emergency Department (HOSPITAL_BASED_OUTPATIENT_CLINIC_OR_DEPARTMENT_OTHER)
Admission: EM | Admit: 2011-01-10 | Discharge: 2011-01-10 | Disposition: A | Discharge status: Home / Self Care | Payer: PRIVATE HEALTH INSURANCE | Attending: Emergency Medicine | Admitting: Emergency Medicine

## 2011-01-10 ENCOUNTER — Encounter (HOSPITAL_BASED_OUTPATIENT_CLINIC_OR_DEPARTMENT_OTHER): Payer: Self-pay | Admitting: Family Medicine

## 2011-01-10 DIAGNOSIS — R197 Diarrhea, unspecified: Secondary | ICD-10-CM

## 2011-01-10 DIAGNOSIS — R109 Unspecified abdominal pain: Secondary | ICD-10-CM | POA: Insufficient documentation

## 2011-01-10 DIAGNOSIS — E119 Type 2 diabetes mellitus without complications: Secondary | ICD-10-CM | POA: Insufficient documentation

## 2011-01-10 DIAGNOSIS — I10 Essential (primary) hypertension: Secondary | ICD-10-CM | POA: Insufficient documentation

## 2011-01-10 LAB — COMPREHENSIVE METABOLIC PANEL
Albumin: 3.4 g/dL — ABNORMAL LOW (ref 3.5–5.2)
Alkaline Phosphatase: 76 U/L (ref 39–117)
BUN: 15 mg/dL (ref 6–23)
Creatinine, Ser: 0.7 mg/dL (ref 0.50–1.10)
Potassium: 3.7 mEq/L (ref 3.5–5.1)
Total Protein: 7.3 g/dL (ref 6.0–8.3)

## 2011-01-10 LAB — URINALYSIS, ROUTINE W REFLEX MICROSCOPIC
Ketones, ur: NEGATIVE mg/dL
Leukocytes, UA: NEGATIVE
Nitrite: NEGATIVE
pH: 5.5 (ref 5.0–8.0)

## 2011-01-10 LAB — LIPASE, BLOOD: Lipase: 34 U/L (ref 11–59)

## 2011-01-10 MED ORDER — DIPHENOXYLATE-ATROPINE 2.5-0.025 MG PO TABS: 1.0000 | ORAL_TABLET | Freq: Four times a day (QID) | ORAL | Status: AC | PRN

## 2011-01-10 MED ORDER — OXYCODONE-ACETAMINOPHEN 5-325 MG PO TABS: 2.0000 | ORAL_TABLET | ORAL | Status: AC | PRN

## 2011-01-10 NOTE — ED Notes (Signed)
Pt c/o diarrhea and abdominal pain onset Monday. Pt sts diarrhea stopped yesterday. Pt denies vomiting, fever.

## 2011-01-10 NOTE — ED Provider Notes (Addendum)
History     CSN: [id number] Arrival date & time: 01/10/2011  8:37 PM   First MD Initiated Contact with Patient 01/10/11 2118      Chief Complaint  Patient presents with  . Abdominal Pain   patient had some generalized weakness on Monday. Tuesday, she began having nausea, vomiting, and diarrhea and felt that she likely had a "stomach flu." She states her boyfriend had similar symptoms last week. She is now having persistent epigastric pain, which is intermittent. She states the diarrhea stopped yesterday and the vomiting. Also stopped. She has had no fever. The pain is nonradiating. She denies any weakness, or syncope. Denies any chest pain  (Consider location/radiation/quality/duration/timing/severity/associated sxs/prior treatment) HPI  Past Medical History  Diagnosis Date  . Diabetes mellitus   . Hypertension   . Anxiety   . Fibroid tumor     Past Surgical History  Procedure Date  . Cholecystectomy   . Appendectomy   . Tonsillectomy     No family history on file.  History  Substance Use Topics  . Smoking status: Former Games developer  . Smokeless tobacco: Never Used  . Alcohol Use: Yes     social    OB History    Grav Para Term Preterm Abortions TAB SAB Ect Mult Living                  Review of Systems  All other systems reviewed and are negative.    Allergies  Review of patient's allergies indicates no known allergies.  Home Medications   Current Outpatient Rx  Name Route Sig Dispense Refill  . VITAMIN C 100 MG PO TABS Oral Take 100 mg by mouth daily.      Marland Kitchen CETIRIZINE-PSEUDOEPHEDRINE ER 5-120 MG PO TB12 Oral Take 2 tablets by mouth daily as needed. For congestion     . IBUPROFEN 800 MG PO TABS Oral Take 800 mg by mouth every 8 (eight) hours as needed. For pain     . LISINOPRIL-HYDROCHLOROTHIAZIDE 20-25 MG PO TABS Oral Take 1 tablet by mouth daily.      Marland Kitchen METFORMIN HCL 1000 MG PO TABS Oral Take 1,000 mg by mouth 2 (two) times daily.      Marland Kitchen ONE-DAILY MULTI  VITAMINS PO TABS Oral Take 1 tablet by mouth daily. CENTRUM     . TRAMADOL HCL 50 MG PO TABS Oral Take 1 tablet (50 mg total) by mouth every 6 (six) hours as needed for pain. Maximum dose= 8 tablets per day 30 tablet 0    BP 119/77  Pulse 82  Temp(Src) 97.7 F (36.5 C) (Oral)  Resp 16  Wt 215 lb (97.523 kg)  SpO2 100%  LMP 01/03/2011  Physical Exam  Nursing note and vitals reviewed. Constitutional: She is oriented to person, place, and time. She appears well-developed and well-nourished. No distress.  HENT:  Head: Normocephalic and atraumatic.  Eyes: Conjunctivae and EOM are normal. Pupils are equal, round, and reactive to light.  Neck: Neck supple.  Cardiovascular: Normal rate and regular rhythm.  Exam reveals no gallop and no friction rub.   No murmur heard. Pulmonary/Chest: Breath sounds normal. She has no wheezes. She has no rales. She exhibits no tenderness.  Abdominal: Soft. Bowel sounds are normal. She exhibits no distension. There is tenderness. There is no rebound and no guarding.       Mild diffuse epigastric tenderness. No right upper quadrant tenderness. No left upper quadrant tenderness. No rebound, rigidity or guarding.  Musculoskeletal:  Normal range of motion.  Neurological: She is alert and oriented to person, place, and time. No cranial nerve deficit. Coordination normal.  Skin: Skin is warm and dry. No rash noted.  Psychiatric: She has a normal mood and affect.    ED Course  Procedures (including critical care time)  Labs Reviewed  URINALYSIS, ROUTINE W REFLEX MICROSCOPIC - Abnormal; Notable for the following:    APPearance CLOUDY (*)    All other components within normal limits  COMPREHENSIVE METABOLIC PANEL - Abnormal; Notable for the following:    Glucose, Bld 173 (*)    Albumin 3.4 (*)    Total Bilirubin 0.1 (*)    All other components within normal limits  PREGNANCY, URINE  LIPASE, BLOOD   No results found.   No diagnosis found.    MDM  Pt  is seen and examined;  Initial history and physical completed.  Will follow.          Takoda Siedlecki A. Patrica Duel, MD 01/10/11 2126  Results for orders placed during the hospital encounter of 01/10/11  URINALYSIS, ROUTINE W REFLEX MICROSCOPIC      Component Value Range   Color, Urine YELLOW  YELLOW    APPearance CLOUDY (*) CLEAR    Specific Gravity, Urine 1.025  1.005 - 1.030    pH 5.5  5.0 - 8.0    Glucose, UA NEGATIVE  NEGATIVE (mg/dL)   Hgb urine dipstick NEGATIVE  NEGATIVE    Bilirubin Urine NEGATIVE  NEGATIVE    Ketones, ur NEGATIVE  NEGATIVE (mg/dL)   Protein, ur NEGATIVE  NEGATIVE (mg/dL)   Urobilinogen, UA 0.2  0.0 - 1.0 (mg/dL)   Nitrite NEGATIVE  NEGATIVE    Leukocytes, UA NEGATIVE  NEGATIVE   PREGNANCY, URINE      Component Value Range   Preg Test, Ur NEGATIVE    COMPREHENSIVE METABOLIC PANEL      Component Value Range   Sodium 136  135 - 145 (mEq/L)   Potassium 3.7  3.5 - 5.1 (mEq/L)   Chloride 101  96 - 112 (mEq/L)   CO2 29  19 - 32 (mEq/L)   Glucose, Bld 173 (*) 70 - 99 (mg/dL)   BUN 15  6 - 23 (mg/dL)   Creatinine, Ser 1.61  0.50 - 1.10 (mg/dL)   Calcium 9.0  8.4 - 09.6 (mg/dL)   Total Protein 7.3  6.0 - 8.3 (g/dL)   Albumin 3.4 (*) 3.5 - 5.2 (g/dL)   AST 11  0 - 37 (U/L)   ALT 12  0 - 35 (U/L)   Alkaline Phosphatase 76  39 - 117 (U/L)   Total Bilirubin 0.1 (*) 0.3 - 1.2 (mg/dL)   GFR calc non Af Amer >90  >90 (mL/min)   GFR calc Af Amer >90  >90 (mL/min)  LIPASE, BLOOD      Component Value Range   Lipase 34  11 - 59 (U/L)   No results found.    Rhaya Coale A. Patrica Duel, MD 01/10/11 2249

## 2011-01-10 NOTE — ED Notes (Signed)
MD at bedside. 

## 2011-03-09 ENCOUNTER — Ambulatory Visit (INDEPENDENT_AMBULATORY_CARE_PROVIDER_SITE_OTHER): Payer: PRIVATE HEALTH INSURANCE | Admitting: Gynecology

## 2011-03-09 ENCOUNTER — Other Ambulatory Visit (HOSPITAL_COMMUNITY)
Admission: RE | Admit: 2011-03-09 | Discharge: 2011-03-09 | Disposition: A | Discharge status: Home / Self Care | Payer: PRIVATE HEALTH INSURANCE | Source: Ambulatory Visit | Attending: Gynecology | Admitting: Gynecology

## 2011-03-09 ENCOUNTER — Encounter: Payer: Self-pay | Admitting: Gynecology

## 2011-03-09 VITALS — BP 122/82 | Ht 65.0 in | Wt 242.0 lb

## 2011-03-09 DIAGNOSIS — Z01419 Encounter for gynecological examination (general) (routine) without abnormal findings: Secondary | ICD-10-CM | POA: Insufficient documentation

## 2011-03-09 DIAGNOSIS — N83209 Unspecified ovarian cyst, unspecified side: Secondary | ICD-10-CM

## 2011-03-09 DIAGNOSIS — N839 Noninflammatory disorder of ovary, fallopian tube and broad ligament, unspecified: Secondary | ICD-10-CM | POA: Insufficient documentation

## 2011-03-09 NOTE — Progress Notes (Signed)
Holly Wells 01/01/1971 [id number]        41 y.o.  for annual exam.  Several issues noted below.  Past medical history,surgical history, medications, allergies, family history and social history were all reviewed and documented in the EPIC chart. ROS:  Was performed and pertinent positives and negatives are included in the history.  Exam: Sherrilyn Rist chaperone present Filed Vitals:   03/09/11 0908  BP: 122/82   General appearance  Normal Skin grossly normal Head/Neck normal with no cervical or supraclavicular adenopathy thyroid normal Lungs  clear Cardiac RR, without RMG Abdominal  soft, nontender, without masses, organomegaly or hernia Breasts  examined lying and sitting without masses, retractions, discharge or axillary adenopathy. Pelvic  Ext/BUS/vagina  normal   Cervix  normal  Pap done  Uterus  anteverted, normal size, shape and contour, midline and mobile nontender   Adnexa  Without masses or tenderness    Anus and perineum  normal   Rectovaginal  normal sphincter tone without palpated masses or tenderness.    Assessment/Plan:  41 y.o. female for annual exam.    1. Recurrent vulvar boils. Patient notes issues with both axillary and vulvar boils consistent with hidradenitis suprativum.  Her exam today is normal without any evidence of boils. Reviewed condition with her. Recommended to symptomatic treatment with warm compresses and soaks when they recur. She has any persistent inflammatory changes she'll call and will consider a short course of antibiotics. Possibilities for low-dose oral contraceptive androgen suppression reviewed but with her history of hypertension and diabetes I am not sure this is a wise choice and she's not interested in this either. 2. Chronic intermittent pelvic pain, persistent right ovarian cyst. History of a persistent 3-4 cm right ovarian cyst with hemorrhagic-appearing left ovarian cyst on last ultrasound, CA 125 5.4, with history of exploratory laparotomy  and removal of her chronic right lower quadrant abscess general surgery, recent referral to Dr. Mia Creek at [facility] to consider surgery in which they decided to treat with pain medication and observation. Patient's had a good response to Ultram and plans to continue to use this. Recommended repeat ultrasound now to follow up on the cyst and she will schedule this. 3. Pregnancy. Patient continues to desire pregnancy. She had the HSG which shows an occluded right side with open left side. She had a history of attempted postpartum tubal sterilization where they said they could not do one side to to adhesions and we did a follow up HSG which showed the above. I again reminded her about being on pregnancy favorable medications and she can follow up with her primary again to discuss this. The need for multivitamin with folic acid preconceptional he and the whole issue of risks of pregnancy at her age with hypertension and diabetes have been all discussed with her. 4. Breast health. SBE monthly reviewed. Recommended baseline mammogram now she agrees to schedule this 5. Pap smear was done today. 6. Health maintenance. No blood work was done today and she will follow up with her primary who does all of this blood work for her. She will follow up with me for her ultrasound and then we'll go from there.    Dara Lords MD, 9:49 AM 03/09/2011

## 2011-03-09 NOTE — Patient Instructions (Signed)
Schedule and follow up for pelvic ultrasound.

## 2011-03-19 ENCOUNTER — Encounter: Payer: Self-pay | Admitting: Gynecology

## 2011-03-19 ENCOUNTER — Ambulatory Visit (INDEPENDENT_AMBULATORY_CARE_PROVIDER_SITE_OTHER): Payer: PRIVATE HEALTH INSURANCE

## 2011-03-19 ENCOUNTER — Ambulatory Visit (INDEPENDENT_AMBULATORY_CARE_PROVIDER_SITE_OTHER): Payer: PRIVATE HEALTH INSURANCE | Admitting: Gynecology

## 2011-03-19 DIAGNOSIS — N83209 Unspecified ovarian cyst, unspecified side: Secondary | ICD-10-CM

## 2011-03-19 DIAGNOSIS — R102 Pelvic and perineal pain: Secondary | ICD-10-CM

## 2011-03-19 DIAGNOSIS — N949 Unspecified condition associated with female genital organs and menstrual cycle: Secondary | ICD-10-CM

## 2011-03-19 DIAGNOSIS — N831 Corpus luteum cyst of ovary, unspecified side: Secondary | ICD-10-CM

## 2011-03-19 NOTE — Progress Notes (Signed)
Patient also for ultrasound to follow her bilateral adnexal cystic masses.  Her office note 03/09/2011 outlines her history.  Ultrasound shows endometrial echo at 15.5 mm noting that she is immediately premenstrual myometrial echotexture is normal. Right ovary with thin-walled avascular echo-free cyst at 46 mm mean left ovary with thick walled low level mass 27 mm mean peripheral flow.  History of pelvic pain primarily with ovulation last 2 days each month she is pain-free the remainder of the month. She's having no dyspareunia. She has a normal CA 125 with persistent bilateral adnexal cystic masses. Her right avascular echo-free cyst is 46 mm mean which is identical to its appearance and size in August 2012. The left low level echo positive peripheral flow cyst is 27 mm mean and a previously was 40 mm me in August. Again we discussed I cannot guarantee that this is not cancer but more likely represents inflammatory changes or benign changes noting a normal CA 125 of 5.4. The issues of surgery and the risks with her adhesion history,  versus continued observation with using tramadol for one to 2 days each month she has her pain and occasional misses at work through [employer] were reviewed.  Patient would prefer expectant management and we'll plan on doing so relook at this area next year unless her symptomatology changes.

## 2011-03-19 NOTE — Patient Instructions (Signed)
Follow up in one year for your annual exam, sooner if any issues. 

## 2011-08-07 ENCOUNTER — Telehealth: Payer: Self-pay

## 2011-08-07 MED ORDER — HYDROCODONE-ACETAMINOPHEN 5-500 MG PO TABS: 1.0000 | ORAL_TABLET | Freq: Four times a day (QID) | ORAL | Status: AC | PRN

## 2011-08-07 NOTE — Telephone Encounter (Signed)
Vicodin okay, but again this would have to be on a limited basis. She reportedly is having pain 2 days each month and we can cover her for that.  Vicodin #20 with one refill. I expect that she should use no more than 10 per month which should last for 4 months.

## 2011-08-07 NOTE — Telephone Encounter (Signed)
PT. NOTIFIED OF TF'S NOTE BELOW AND TOLD HER RX WENT TO PHARMACY.

## 2011-08-07 NOTE — Telephone Encounter (Signed)
Told pt. It would be best if she saw you for another o.v. But insisted I ask you for Vicodin Rx instead of Tramadol because she states she uses less Vicodin monthly when ovarian cyst causes her severe pain.

## 2011-08-13 ENCOUNTER — Emergency Department (HOSPITAL_BASED_OUTPATIENT_CLINIC_OR_DEPARTMENT_OTHER)
Admission: EM | Admit: 2011-08-13 | Discharge: 2011-08-13 | Disposition: A | Discharge status: Home / Self Care | Payer: PRIVATE HEALTH INSURANCE | Attending: Emergency Medicine | Admitting: Emergency Medicine

## 2011-08-13 ENCOUNTER — Encounter (HOSPITAL_BASED_OUTPATIENT_CLINIC_OR_DEPARTMENT_OTHER): Payer: Self-pay | Admitting: Family Medicine

## 2011-08-13 DIAGNOSIS — R110 Nausea: Secondary | ICD-10-CM | POA: Insufficient documentation

## 2011-08-13 DIAGNOSIS — J329 Chronic sinusitis, unspecified: Secondary | ICD-10-CM | POA: Insufficient documentation

## 2011-08-13 DIAGNOSIS — E119 Type 2 diabetes mellitus without complications: Secondary | ICD-10-CM | POA: Insufficient documentation

## 2011-08-13 DIAGNOSIS — I10 Essential (primary) hypertension: Secondary | ICD-10-CM | POA: Insufficient documentation

## 2011-08-13 DIAGNOSIS — R42 Dizziness and giddiness: Secondary | ICD-10-CM | POA: Insufficient documentation

## 2011-08-13 LAB — CBC WITH DIFFERENTIAL/PLATELET
Basophils Relative: 0 % (ref 0–1)
Hemoglobin: 11.4 g/dL — ABNORMAL LOW (ref 12.0–15.0)
Lymphs Abs: 3.5 10*3/uL (ref 0.7–4.0)
MCHC: 33.9 g/dL (ref 30.0–36.0)
Monocytes Relative: 7 % (ref 3–12)
Neutro Abs: 5.5 10*3/uL (ref 1.7–7.7)
Neutrophils Relative %: 55 % (ref 43–77)
Platelets: 325 10*3/uL (ref 150–400)
RBC: 4.14 MIL/uL (ref 3.87–5.11)

## 2011-08-13 LAB — BASIC METABOLIC PANEL
GFR calc Af Amer: >90 mL/min (ref >90)
GFR calc non Af Amer: >90 mL/min (ref >90)
Potassium: 3.7 mEq/L (ref 3.5–5.1)
Sodium: 134 mEq/L — ABNORMAL LOW (ref 135–145)

## 2011-08-13 LAB — URINALYSIS, ROUTINE W REFLEX MICROSCOPIC
Leukocytes, UA: NEGATIVE
Nitrite: NEGATIVE
Specific Gravity, Urine: 1.023 (ref 1.005–1.030)
pH: 5.5 (ref 5.0–8.0)

## 2011-08-13 LAB — PREGNANCY, URINE: Preg Test, Ur: NEGATIVE

## 2011-08-13 MED ORDER — FLUTICASONE PROPIONATE 50 MCG/ACT NA SUSP: 2.0000 | Freq: Every day | NASAL | Status: DC

## 2011-08-13 MED ORDER — MECLIZINE HCL 25 MG PO TABS: 25.0000 mg | ORAL_TABLET | Freq: Three times a day (TID) | ORAL | Status: AC | PRN

## 2011-08-13 MED ORDER — MECLIZINE HCL 25 MG PO TABS
25.0000 mg | ORAL_TABLET | Freq: Once | ORAL | Status: AC
  Administered 2011-08-13: 25 mg via ORAL
  Filled 2011-08-13: qty 1

## 2011-08-13 NOTE — ED Notes (Signed)
Pt c/o feeling dizzy since noon today. Pt sts "I work 3 jobs and I've had this happen before and I was dehydrated". Pt alert and oriented, drove self to ED. Pt c/o "pressure to left side of head and drainage".

## 2011-08-13 NOTE — ED Notes (Signed)
Pt c/o "pressure in head"

## 2011-08-13 NOTE — ED Provider Notes (Signed)
History     CSN: [id number]  Arrival date & time 08/13/11  4098   First MD Initiated Contact with Patient 08/13/11 2037      9:41 PM HPI Pt reports dizziness that began about 10 hours ago. Describes symptoms as lightheadedness and nausea. Reports she thought her CBG was low and ate M&Ms. Reports symptoms have improved while she has been in the ED. Reports a significant h/o left sided sinus congestion for several weeks. Denies fever, numbness, tingling, weakness, CP, SOB, dysuria, hematuria, abdominal pain headache or neck pain. The history is provided by the patient.    Past Medical History  Diagnosis Date  . Diabetes mellitus   . Hypertension   . Anxiety   . Fibroid tumor   . 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 Ovarian cyst     Past Surgical History  Procedure Date  . Cholecystectomy   . Appendectomy   . Tonsillectomy     Family History  Problem Relation Age of Onset  . Hypertension Mother   . Diabetes Maternal Grandmother   . Hypertension Maternal Grandmother   . Breast cancer Maternal Grandmother   . Diabetes Maternal Grandfather   . Hypertension Maternal Grandfather     History  Substance Use Topics  . Smoking status: Former Games developer  . Smokeless tobacco: Never Used  . Alcohol Use: Yes     social    OB History    Grav Para Term Preterm Abortions TAB SAB Ect Mult Living   2 2        2       Review of Systems  Constitutional: Negative for fever and chills.  HENT: Negative for congestion, sore throat, rhinorrhea, trouble swallowing, neck pain, neck stiffness, postnasal drip and sinus pressure.   Respiratory: Negative for cough and shortness of breath.   Cardiovascular: Negative for palpitations.  Gastrointestinal: Positive for nausea. Negative for vomiting.  Musculoskeletal: Negative for back pain.  Neurological: Positive for dizziness and light-headedness. Negative for seizures, speech difficulty, weakness, numbness and  headaches.  All other systems reviewed and are negative.    Allergies  Review of patient's allergies indicates no known allergies.  Home Medications   Current Outpatient Rx  Name Route Sig Dispense Refill  . ASPIRIN 81 MG PO TABS Oral Take 81 mg by mouth daily.    Marland Kitchen CETIRIZINE-PSEUDOEPHEDRINE ER 5-120 MG PO TB12 Oral Take 2 tablets by mouth daily as needed. For congestion     . HYDROCODONE-ACETAMINOPHEN 5-500 MG PO TABS Oral Take 1 tablet by mouth every 6 (six) hours as needed for pain. 20 tablet 1  . IBUPROFEN 800 MG PO TABS Oral Take 800 mg by mouth every 8 (eight) hours as needed. For pain     . LISINOPRIL-HYDROCHLOROTHIAZIDE 20-25 MG PO TABS Oral Take 1 tablet by mouth daily.      Marland Kitchen METFORMIN HCL 1000 MG PO TABS Oral Take 1,000 mg by mouth 2 (two) times daily.      Marland Kitchen ONE-DAILY MULTI VITAMINS PO TABS Oral Take 1 tablet by mouth daily. CENTRUM     . TRAMADOL HCL 50 MG PO TABS Oral Take 50 mg by mouth every 6 (six) hours as needed.      BP 115/77  Pulse 88  Temp 98.1 F (36.7 C) (Oral)  Resp 16  Ht 5\' 5"  (1.651 m)  Wt 230 lb (104.327 kg)  BMI 38.27 kg/m2  SpO2 100%  LMP 07/23/2011  Physical Exam  Vitals reviewed. Constitutional: She is oriented to person, place, and time. Vital signs are normal. She appears well-developed and well-nourished.  HENT:  Head: Normocephalic and atraumatic.  Right Ear: External ear normal.  Left Ear: External ear normal.  Nose: Rhinorrhea present. No mucosal edema. Right sinus exhibits no maxillary sinus tenderness and no frontal sinus tenderness. Left sinus exhibits maxillary sinus tenderness. Left sinus exhibits no frontal sinus tenderness.  Mouth/Throat: Oropharynx is clear and moist.  Eyes: Conjunctivae and EOM are normal. Pupils are equal, round, and reactive to light. Right eye exhibits no discharge. Left eye exhibits no discharge.  Neck: Normal range of motion. Neck supple.  Cardiovascular: Normal rate, regular rhythm and normal heart  sounds.  Exam reveals no friction rub.   No murmur heard. Pulmonary/Chest: Effort normal and breath sounds normal. She has no wheezes. She has no rhonchi. She has no rales. She exhibits no tenderness.  Musculoskeletal: Normal range of motion.  Lymphadenopathy:    She has no cervical adenopathy.  Neurological: She is alert and oriented to person, place, and time. No cranial nerve deficit (Tested CN III-XII). Coordination (neg pronator drift, past pointing and nystagmus) normal.  Skin: Skin is warm and dry. No rash noted. No erythema. No pallor.  Psychiatric: She has a normal mood and affect. Her behavior is normal.    ED Course  Procedures  Results for orders placed during the hospital encounter of 08/13/11  URINALYSIS, ROUTINE W REFLEX MICROSCOPIC      Component Value Range   Color, Urine YELLOW  YELLOW   APPearance CLOUDY (*) CLEAR   Specific Gravity, Urine 1.023  1.005 - 1.030   pH 5.5  5.0 - 8.0   Glucose, UA 250 (*) NEGATIVE mg/dL   Hgb urine dipstick NEGATIVE  NEGATIVE   Bilirubin Urine NEGATIVE  NEGATIVE   Ketones, ur NEGATIVE  NEGATIVE mg/dL   Protein, ur NEGATIVE  NEGATIVE mg/dL   Urobilinogen, UA 0.2  0.0 - 1.0 mg/dL   Nitrite NEGATIVE  NEGATIVE   Leukocytes, UA NEGATIVE  NEGATIVE  PREGNANCY, URINE      Component Value Range   Preg Test, Ur NEGATIVE  NEGATIVE  BASIC METABOLIC PANEL      Component Value Range   Sodium 134 (*) 135 - 145 mEq/L   Potassium 3.7  3.5 - 5.1 mEq/L   Chloride 97  96 - 112 mEq/L   CO2 27  19 - 32 mEq/L   Glucose, Bld 228 (*) 70 - 99 mg/dL   BUN 14  6 - 23 mg/dL   Creatinine, Ser 6.57  0.50 - 1.10 mg/dL   Calcium 9.3  8.4 - 84.6 mg/dL   GFR calc non Af Amer >90  >90 mL/min   GFR calc Af Amer >90  >90 mL/min  CBC WITH DIFFERENTIAL      Component Value Range   WBC 10.0  4.0 - 10.5 K/uL   RBC 4.14  3.87 - 5.11 MIL/uL   Hemoglobin 11.4 (*) 12.0 - 15.0 g/dL   HCT 96.2 (*) 95.2 - 84.1 %   MCV 81.2  78.0 - 100.0 fL   MCH 27.5  26.0 - 34.0 pg    MCHC 33.9  30.0 - 36.0 g/dL   RDW 32.4  40.1 - 02.7 %   Platelets 325  150 - 400 K/uL   Neutrophils Relative 55  43 - 77 %   Neutro Abs 5.5  1.7 - 7.7 K/uL   Lymphocytes Relative 35  12 - 46 %   Lymphs Abs 3.5  0.7 - 4.0 K/uL   Monocytes Relative 7  3 - 12 %   Monocytes Absolute 0.7  0.1 - 1.0 K/uL   Eosinophils Relative 2  0 - 5 %   Eosinophils Absolute 0.2  0.0 - 0.7 K/uL   Basophils Relative 0  0 - 1 %   Basophils Absolute 0.0  0.0 - 0.1 K/uL     MDM    Reports improved symptoms with meclizine. Not orthostatic. Suspect symptoms are due to sinusitis will treat with afrin, fluticasone and meclizine. Pt voices understanding and is ready for d/c      Thomasene Lot, Cordelia Poche 08/13/11 2303

## 2011-08-13 NOTE — ED Provider Notes (Signed)
Medical screening examination/treatment/procedure(s) were performed by non-physician practitioner and as supervising physician I was immediately available for consultation/collaboration.   Rolan Bucco, MD 08/13/11 [phone]

## 2011-11-15 ENCOUNTER — Telehealth: Payer: Self-pay | Admitting: *Deleted

## 2011-11-15 NOTE — Telephone Encounter (Signed)
Pt never picked up Vicodin 5/500 # 20 tablet with 1 refill at [facility] back on 08/07/11. I called pharmacy to verify that Rx was never picked up and pharmacist said last Rx for Vicodin was picked up in 2012. rx called in, pt said she never picked up as well.

## 2011-12-04 ENCOUNTER — Emergency Department (HOSPITAL_BASED_OUTPATIENT_CLINIC_OR_DEPARTMENT_OTHER): Payer: PRIVATE HEALTH INSURANCE

## 2011-12-04 ENCOUNTER — Emergency Department (HOSPITAL_BASED_OUTPATIENT_CLINIC_OR_DEPARTMENT_OTHER)
Admission: EM | Admit: 2011-12-04 | Discharge: 2011-12-04 | Disposition: A | Discharge status: Home / Self Care | Payer: PRIVATE HEALTH INSURANCE | Attending: Emergency Medicine | Admitting: Emergency Medicine

## 2011-12-04 ENCOUNTER — Encounter (HOSPITAL_BASED_OUTPATIENT_CLINIC_OR_DEPARTMENT_OTHER): Payer: Self-pay | Admitting: *Deleted

## 2011-12-04 DIAGNOSIS — Z803 Family history of malignant neoplasm of breast: Secondary | ICD-10-CM | POA: Insufficient documentation

## 2011-12-04 DIAGNOSIS — M545 Low back pain, unspecified: Secondary | ICD-10-CM | POA: Insufficient documentation

## 2011-12-04 DIAGNOSIS — R0602 Shortness of breath: Secondary | ICD-10-CM | POA: Insufficient documentation

## 2011-12-04 DIAGNOSIS — R109 Unspecified abdominal pain: Secondary | ICD-10-CM

## 2011-12-04 DIAGNOSIS — E119 Type 2 diabetes mellitus without complications: Secondary | ICD-10-CM | POA: Insufficient documentation

## 2011-12-04 DIAGNOSIS — Z87891 Personal history of nicotine dependence: Secondary | ICD-10-CM | POA: Insufficient documentation

## 2011-12-04 DIAGNOSIS — R079 Chest pain, unspecified: Secondary | ICD-10-CM | POA: Insufficient documentation

## 2011-12-04 DIAGNOSIS — K219 Gastro-esophageal reflux disease without esophagitis: Secondary | ICD-10-CM

## 2011-12-04 DIAGNOSIS — F411 Generalized anxiety disorder: Secondary | ICD-10-CM | POA: Insufficient documentation

## 2011-12-04 DIAGNOSIS — I10 Essential (primary) hypertension: Secondary | ICD-10-CM | POA: Insufficient documentation

## 2011-12-04 LAB — PREGNANCY, URINE: Preg Test, Ur: NEGATIVE

## 2011-12-04 LAB — URINALYSIS, ROUTINE W REFLEX MICROSCOPIC
Hgb urine dipstick: NEGATIVE
Leukocytes, UA: NEGATIVE
Specific Gravity, Urine: 1.027 (ref 1.005–1.030)
Urobilinogen, UA: 0.2 mg/dL (ref 0.0–1.0)

## 2011-12-04 LAB — CBC WITH DIFFERENTIAL/PLATELET
Basophils Absolute: 0 10*3/uL (ref 0.0–0.1)
Basophils Relative: 0 % (ref 0–1)
HCT: 35.2 % — ABNORMAL LOW (ref 36.0–46.0)
Hemoglobin: 12 g/dL (ref 12.0–15.0)
Lymphocytes Relative: 30 % (ref 12–46)
MCHC: 34.1 g/dL (ref 30.0–36.0)
Monocytes Relative: 7 % (ref 3–12)
Neutro Abs: 4.7 10*3/uL (ref 1.7–7.7)
Neutrophils Relative %: 59 % (ref 43–77)
RDW: 12.7 % (ref 11.5–15.5)
WBC: 7.9 10*3/uL (ref 4.0–10.5)

## 2011-12-04 LAB — COMPREHENSIVE METABOLIC PANEL
AST: 12 U/L (ref 0–37)
Albumin: 3.7 g/dL (ref 3.5–5.2)
Alkaline Phosphatase: 81 U/L (ref 39–117)
CO2: 28 mEq/L (ref 19–32)
Chloride: 96 mEq/L (ref 96–112)
GFR calc non Af Amer: >90 mL/min (ref >90)
Potassium: 4 mEq/L (ref 3.5–5.1)
Total Bilirubin: 0.1 mg/dL — ABNORMAL LOW (ref 0.3–1.2)

## 2011-12-04 NOTE — ED Notes (Signed)
Patient states she has had upper abdominal pain for one week.  States she has taken prevacid with no relief.  States pain is radiating into her back and at times to her left chest.  States the pain feels like food is stuck in her esophagus and she is making her vomit to relieve the pain.

## 2011-12-04 NOTE — ED Provider Notes (Addendum)
History     CSN: [id number]  Arrival date & time 12/04/11  7829   First MD Initiated Contact with Patient 12/04/11 [phone]      Chief Complaint  Patient presents with  . Abdominal Pain    (Consider location/radiation/quality/duration/timing/severity/associated sxs/prior treatment) The history is provided by the patient.   patient with past history of diabetes and hypertension. Presents today with a complaint of upper abdominal pain for one week has been taking Prevacid with no relief. His pain is radiating to her back at times to her left chest. Patient states it feels like food is stuck in her esophagus and and not going down properly. Seems to be choking at night gets a acid taste in her mouth. Patient's primary care provider is [facility]. The chest pain is substernal area radiates over to the left and sometimes to the back is more of a burning sensation seems to be worse after eating food. And worse at night. Does not radiate into the left arm. Pain at worst is a 10 out of 10 currently is about 5/10. Patient is able to swallow liquids and saliva is going down the esophagus fine.  Past Medical History  Diagnosis Date  . Diabetes mellitus   . Hypertension   . Anxiety   . Fibroid tumor   . 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 Ovarian cyst     Past Surgical History  Procedure Date  . Cholecystectomy   . Appendectomy   . Tonsillectomy     Family History  Problem Relation Age of Onset  . Hypertension Mother   . Diabetes Maternal Grandmother   . Hypertension Maternal Grandmother   . Breast cancer Maternal Grandmother   . Diabetes Maternal Grandfather   . Hypertension Maternal Grandfather     History  Substance Use Topics  . Smoking status: Former Games developer  . Smokeless tobacco: Never Used  . Alcohol Use: Yes     social    OB History    Grav Para Term Preterm Abortions TAB SAB Ect Mult Living   2 2        2       Review of Systems    Constitutional: Negative for fever.  HENT: Negative for sore throat.   Respiratory: Positive for chest tightness and shortness of breath.   Cardiovascular: Positive for chest pain.  Gastrointestinal: Positive for abdominal pain. Negative for nausea and diarrhea.  Genitourinary: Negative for dysuria.  Musculoskeletal: Positive for back pain.  Skin: Negative for rash.  Neurological: Negative for headaches.  Hematological: Does not bruise/bleed easily.    Allergies  Review of patient's allergies indicates no known allergies.  Home Medications   Current Outpatient Rx  Name Route Sig Dispense Refill  . AMOXICILLIN 500 MG PO CAPS Oral Take 500 mg by mouth 3 (three) times daily.    . ASPIRIN 81 MG PO TABS Oral Take 81 mg by mouth daily.    Marland Kitchen CETIRIZINE-PSEUDOEPHEDRINE ER 5-120 MG PO TB12 Oral Take 2 tablets by mouth daily as needed. For congestion     . FLUTICASONE PROPIONATE 50 MCG/ACT NA SUSP Nasal Place 2 sprays into the nose daily. 16 g 0  . HYDROCODONE-ACETAMINOPHEN 5-325 MG PO TABS Oral Take 1 tablet by mouth every 6 (six) hours as needed.    . IBUPROFEN 800 MG PO TABS Oral Take 800 mg by mouth every 8 (eight) hours as needed. For pain     .  LISINOPRIL-HYDROCHLOROTHIAZIDE 20-25 MG PO TABS Oral Take 1 tablet by mouth daily.      Marland Kitchen METFORMIN HCL 1000 MG PO TABS Oral Take 1,000 mg by mouth 2 (two) times daily.      Marland Kitchen ONE-DAILY MULTI VITAMINS PO TABS Oral Take 1 tablet by mouth daily. CENTRUM     . TRAMADOL HCL 50 MG PO TABS Oral Take 50 mg by mouth every 6 (six) hours as needed.      BP 121/89  Pulse 111  Temp 98.3 F (36.8 C) (Oral)  Resp 18  Ht 5\' 5"  (1.651 m)  Wt 230 lb (104.327 kg)  BMI 38.27 kg/m2  SpO2 97%  LMP 11/25/2011  Physical Exam  Nursing note and vitals reviewed. Constitutional: She is oriented to person, place, and time. She appears well-developed and well-nourished. No distress.  HENT:  Head: Normocephalic and atraumatic.  Mouth/Throat: Oropharynx is clear  and moist. No oropharyngeal exudate.  Eyes: Conjunctivae normal and EOM are normal. Pupils are equal, round, and reactive to light.  Neck: Normal range of motion.  Cardiovascular: Normal rate, regular rhythm and normal heart sounds.   No murmur heard. Pulmonary/Chest: Effort normal and breath sounds normal. No respiratory distress. She has no wheezes. She has no rales.  Abdominal: Soft. Bowel sounds are normal. There is no tenderness.  Musculoskeletal: Normal range of motion.  Neurological: She is alert and oriented to person, place, and time. No cranial nerve deficit. She exhibits normal muscle tone. Coordination normal.  Skin: Skin is warm. No rash noted.    ED Course  Procedures (including critical care time)  Labs Reviewed  URINALYSIS, ROUTINE W REFLEX MICROSCOPIC - Abnormal; Notable for the following:    Glucose, UA 500 (*)     All other components within normal limits  PREGNANCY, URINE  CBC WITH DIFFERENTIAL  COMPREHENSIVE METABOLIC PANEL  LIPASE, BLOOD  TROPONIN I   No results found.  Date: 12/04/2011  Rate: 95  Rhythm: normal sinus rhythm  QRS Axis: normal  Intervals: normal  ST/T Wave abnormalities: normal  Conduction Disutrbances:first-degree A-V block   Narrative Interpretation:   Old EKG Reviewed: none available Correction no first degree block. Nonspecific T wave changes.  Results for orders placed during the hospital encounter of 12/04/11  URINALYSIS, ROUTINE W REFLEX MICROSCOPIC      Component Value Range   Color, Urine YELLOW  YELLOW   APPearance CLEAR  CLEAR   Specific Gravity, Urine 1.027  1.005 - 1.030   pH 5.0  5.0 - 8.0   Glucose, UA 500 (*) NEGATIVE mg/dL   Hgb urine dipstick NEGATIVE  NEGATIVE   Bilirubin Urine NEGATIVE  NEGATIVE   Ketones, ur NEGATIVE  NEGATIVE mg/dL   Protein, ur NEGATIVE  NEGATIVE mg/dL   Urobilinogen, UA 0.2  0.0 - 1.0 mg/dL   Nitrite NEGATIVE  NEGATIVE   Leukocytes, UA NEGATIVE  NEGATIVE  PREGNANCY, URINE      Component  Value Range   Preg Test, Ur NEGATIVE  NEGATIVE  CBC WITH DIFFERENTIAL      Component Value Range   WBC 7.9  4.0 - 10.5 K/uL   RBC 4.41  3.87 - 5.11 MIL/uL   Hemoglobin 12.0  12.0 - 15.0 g/dL   HCT 16.1 (*) 09.6 - 04.5 %   MCV 79.8  78.0 - 100.0 fL   MCH 27.2  26.0 - 34.0 pg   MCHC 34.1  30.0 - 36.0 g/dL   RDW 40.9  81.1 - 91.4 %  Platelets 344  150 - 400 K/uL   Neutrophils Relative 59  43 - 77 %   Neutro Abs 4.7  1.7 - 7.7 K/uL   Lymphocytes Relative 30  12 - 46 %   Lymphs Abs 2.4  0.7 - 4.0 K/uL   Monocytes Relative 7  3 - 12 %   Monocytes Absolute 0.6  0.1 - 1.0 K/uL   Eosinophils Relative 3  0 - 5 %   Eosinophils Absolute 0.2  0.0 - 0.7 K/uL   Basophils Relative 0  0 - 1 %   Basophils Absolute 0.0  0.0 - 0.1 K/uL  COMPREHENSIVE METABOLIC PANEL      Component Value Range   Sodium 134 (*) 135 - 145 mEq/L   Potassium 4.0  3.5 - 5.1 mEq/L   Chloride 96  96 - 112 mEq/L   CO2 28  19 - 32 mEq/L   Glucose, Bld 262 (*) 70 - 99 mg/dL   BUN 17  6 - 23 mg/dL   Creatinine, Ser 1.61  0.50 - 1.10 mg/dL   Calcium 9.3  8.4 - 09.6 mg/dL   Total Protein 8.0  6.0 - 8.3 g/dL   Albumin 3.7  3.5 - 5.2 g/dL   AST 12  0 - 37 U/L   ALT 13  0 - 35 U/L   Alkaline Phosphatase 81  39 - 117 U/L   Total Bilirubin 0.1 (*) 0.3 - 1.2 mg/dL   GFR calc non Af Amer >90  >90 mL/min   GFR calc Af Amer >90  >90 mL/min  LIPASE, BLOOD      Component Value Range   Lipase 41  11 - 59 U/L  TROPONIN I      Component Value Range   Troponin I <0.30  <0.30 ng/mL     1. Abdominal pain       MDM   Workup negative for any cardiac stuff. Patient's symptoms are really consistent with significant reflux disease. Patient will need an upper endoscopy for further evaluation patient will followup with her regular Dr. Vickie Epley here without any specific findings LFTs without significant abnormalities lipase is normal troponin was negative EKG without any acute changes no evidence of acute cardiac  event.         Shelda Jakes, MD 12/04/11 1238  Shelda Jakes, MD 12/04/11 1245

## 2012-04-08 ENCOUNTER — Emergency Department (HOSPITAL_BASED_OUTPATIENT_CLINIC_OR_DEPARTMENT_OTHER)
Admission: EM | Admit: 2012-04-08 | Discharge: 2012-04-08 | Disposition: A | Discharge status: Home / Self Care | Payer: PRIVATE HEALTH INSURANCE | Attending: Emergency Medicine | Admitting: Emergency Medicine

## 2012-04-08 ENCOUNTER — Emergency Department (HOSPITAL_BASED_OUTPATIENT_CLINIC_OR_DEPARTMENT_OTHER): Payer: PRIVATE HEALTH INSURANCE

## 2012-04-08 ENCOUNTER — Encounter (HOSPITAL_BASED_OUTPATIENT_CLINIC_OR_DEPARTMENT_OTHER): Payer: Self-pay | Admitting: *Deleted

## 2012-04-08 DIAGNOSIS — N760 Acute vaginitis: Secondary | ICD-10-CM | POA: Insufficient documentation

## 2012-04-08 DIAGNOSIS — B9689 Other specified bacterial agents as the cause of diseases classified elsewhere: Secondary | ICD-10-CM

## 2012-04-08 DIAGNOSIS — N83209 Unspecified ovarian cyst, unspecified side: Secondary | ICD-10-CM | POA: Insufficient documentation

## 2012-04-08 DIAGNOSIS — Z79899 Other long term (current) drug therapy: Secondary | ICD-10-CM | POA: Insufficient documentation

## 2012-04-08 DIAGNOSIS — I10 Essential (primary) hypertension: Secondary | ICD-10-CM | POA: Insufficient documentation

## 2012-04-08 DIAGNOSIS — Z8659 Personal history of other mental and behavioral disorders: Secondary | ICD-10-CM | POA: Insufficient documentation

## 2012-04-08 DIAGNOSIS — E119 Type 2 diabetes mellitus without complications: Secondary | ICD-10-CM | POA: Insufficient documentation

## 2012-04-08 DIAGNOSIS — Z7982 Long term (current) use of aspirin: Secondary | ICD-10-CM | POA: Insufficient documentation

## 2012-04-08 DIAGNOSIS — Z87891 Personal history of nicotine dependence: Secondary | ICD-10-CM | POA: Insufficient documentation

## 2012-04-08 DIAGNOSIS — Z8742 Personal history of other diseases of the female genital tract: Secondary | ICD-10-CM | POA: Insufficient documentation

## 2012-04-08 DIAGNOSIS — Z3202 Encounter for pregnancy test, result negative: Secondary | ICD-10-CM | POA: Insufficient documentation

## 2012-04-08 LAB — CBC WITH DIFFERENTIAL/PLATELET
Basophils Relative: 0 % (ref 0–1)
Eosinophils Absolute: 0.2 10*3/uL (ref 0.0–0.7)
Eosinophils Relative: 2 % (ref 0–5)
HCT: 34.5 % — ABNORMAL LOW (ref 36.0–46.0)
Hemoglobin: 11.6 g/dL — ABNORMAL LOW (ref 12.0–15.0)
MCH: 27.6 pg (ref 26.0–34.0)
MCHC: 33.6 g/dL (ref 30.0–36.0)
MCV: 82.1 fL (ref 78.0–100.0)
Monocytes Absolute: 0.8 10*3/uL (ref 0.1–1.0)
Monocytes Relative: 7 % (ref 3–12)
Neutro Abs: 6.6 10*3/uL (ref 1.7–7.7)

## 2012-04-08 LAB — BASIC METABOLIC PANEL
BUN: 23 mg/dL (ref 6–23)
CO2: 25 mEq/L (ref 19–32)
Calcium: 9.3 mg/dL (ref 8.4–10.5)
Chloride: 101 mEq/L (ref 96–112)
Creatinine, Ser: 0.9 mg/dL (ref 0.50–1.10)
GFR calc Af Amer: >90 mL/min (ref >90)
GFR calc non Af Amer: 78 mL/min — ABNORMAL LOW (ref >90)
Glucose, Bld: 161 mg/dL — ABNORMAL HIGH (ref 70–99)
Potassium: 3.6 mEq/L (ref 3.5–5.1)
Sodium: 138 mEq/L (ref 135–145)

## 2012-04-08 LAB — PREGNANCY, URINE: Preg Test, Ur: NEGATIVE

## 2012-04-08 LAB — WET PREP, GENITAL
Trich, Wet Prep: NONE SEEN
Yeast Wet Prep HPF POC: NONE SEEN

## 2012-04-08 LAB — URINALYSIS, ROUTINE W REFLEX MICROSCOPIC
Bilirubin Urine: NEGATIVE
Ketones, ur: NEGATIVE mg/dL
Nitrite: NEGATIVE
pH: 5 (ref 5.0–8.0)

## 2012-04-08 MED ORDER — ONDANSETRON HCL 4 MG/2ML IJ SOLN
4.0000 mg | Freq: Once | INTRAMUSCULAR | Status: AC
  Administered 2012-04-08: 4 mg via INTRAVENOUS
  Filled 2012-04-08: qty 2

## 2012-04-08 MED ORDER — METRONIDAZOLE 500 MG PO TABS: 500.0000 mg | ORAL_TABLET | Freq: Two times a day (BID) | ORAL | Status: DC

## 2012-04-08 MED ORDER — HYDROMORPHONE HCL PF 1 MG/ML IJ SOLN
1.0000 mg | Freq: Once | INTRAMUSCULAR | Status: AC
  Administered 2012-04-08: 1 mg via INTRAVENOUS
  Filled 2012-04-08: qty 1

## 2012-04-08 MED ORDER — MORPHINE SULFATE 4 MG/ML IJ SOLN
4.0000 mg | Freq: Once | INTRAMUSCULAR | Status: AC
  Administered 2012-04-08: 4 mg via INTRAVENOUS
  Filled 2012-04-08: qty 1

## 2012-04-08 MED ORDER — OXYCODONE-ACETAMINOPHEN 5-325 MG PO TABS: 2.0000 | ORAL_TABLET | ORAL | Status: DC | PRN

## 2012-04-08 NOTE — ED Notes (Signed)
Right upper leg pain. Burning sensation.

## 2012-04-08 NOTE — ED Notes (Signed)
Pelvic cart setup, Deliah Boston, NP notified.

## 2012-04-08 NOTE — ED Provider Notes (Signed)
History     CSN: [id number]  Arrival date & time 04/08/12  1425   First MD Initiated Contact with Patient 04/08/12 1441      Chief Complaint  Patient presents with  . Leg Pain    (Consider location/radiation/quality/duration/timing/severity/associated sxs/prior treatment) HPI Comments: Pt states that she is having rlq pain that radiates down his leg:pt states that it is similar to previous cyst, but she has never had burning:pt denies dysuria:pt states that she is due to have an ultrasound to have the cyst rechecked:no vomiting diarrhea, fever:pt denies anything make the pain better or worse:pt states that she took ibuprofen and vicodin without relief  The history is provided by the patient. No language interpreter was used.    Past Medical History  Diagnosis Date  . Diabetes mellitus   . Hypertension   . Anxiety   . Fibroid tumor   . 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 Ovarian cyst     Past Surgical History  Procedure Laterality Date  . Cholecystectomy    . Appendectomy    . Tonsillectomy      Family History  Problem Relation Age of Onset  . Hypertension Mother   . Diabetes Maternal Grandmother   . Hypertension Maternal Grandmother   . Breast cancer Maternal Grandmother   . Diabetes Maternal Grandfather   . Hypertension Maternal Grandfather     History  Substance Use Topics  . Smoking status: Former Games developer  . Smokeless tobacco: Never Used  . Alcohol Use: Yes     Comment: social    OB History   Grav Para Term Preterm Abortions TAB SAB Ect Mult Living   2 2        2       Review of Systems  Constitutional: Negative.   Respiratory: Negative.   Cardiovascular: Negative.     Allergies  Review of patient's allergies indicates no known allergies.  Home Medications   Current Outpatient Rx  Name  Route  Sig  Dispense  Refill  . amoxicillin (AMOXIL) 500 MG capsule   Oral   Take 500 mg by mouth 3 (three) times daily.          Marland Kitchen aspirin 81 MG tablet   Oral   Take 81 mg by mouth daily.         . cetirizine-pseudoephedrine (ZYRTEC-D) 5-120 MG per tablet   Oral   Take 2 tablets by mouth daily as needed. For congestion          . fluticasone (FLONASE) 50 MCG/ACT nasal spray   Nasal   Place 2 sprays into the nose daily.   16 g   0   . HYDROcodone-acetaminophen (NORCO/VICODIN) 5-325 MG per tablet   Oral   Take 1 tablet by mouth every 6 (six) hours as needed.         Marland Kitchen ibuprofen (ADVIL,MOTRIN) 800 MG tablet   Oral   Take 800 mg by mouth every 8 (eight) hours as needed. For pain          . lisinopril-hydrochlorothiazide (PRINZIDE,ZESTORETIC) 20-25 MG per tablet   Oral   Take 1 tablet by mouth daily.           . metFORMIN (GLUCOPHAGE) 1000 MG tablet   Oral   Take 1,000 mg by mouth 2 (two) times daily.           . Multiple Vitamin (MULTIVITAMIN) tablet   Oral  Take 1 tablet by mouth daily. CENTRUM          . traMADol (ULTRAM) 50 MG tablet   Oral   Take 50 mg by mouth every 6 (six) hours as needed.           BP 104/68  Pulse 86  Temp(Src) 97.5 F (36.4 C) (Oral)  Resp 20  Wt 220 lb (99.791 kg)  BMI 36.61 kg/m2  SpO2 100%  LMP 03/31/2012  Physical Exam  Nursing note and vitals reviewed. Constitutional: She is oriented to person, place, and time. She appears well-developed and well-nourished.  HENT:  Head: Normocephalic and atraumatic.  Eyes: Conjunctivae and EOM are normal.  Neck: Normal range of motion. Neck supple.  Cardiovascular: Normal rate and regular rhythm.   Pulmonary/Chest: Effort normal and breath sounds normal.  Abdominal: Soft. Bowel sounds are normal. There is tenderness.  Genitourinary:  White vaginal discharge  Musculoskeletal: Normal range of motion.  Neurological: She is alert and oriented to person, place, and time.  Skin: Skin is warm and dry.    ED Course  Procedures (including critical care time)  Labs Reviewed  WET PREP, GENITAL -  Abnormal; Notable for the following:    Clue Cells Wet Prep HPF POC MODERATE (*)    WBC, Wet Prep HPF POC FEW (*)    All other components within normal limits  CBC WITH DIFFERENTIAL - Abnormal; Notable for the following:    WBC 10.9 (*)    Hemoglobin 11.6 (*)    HCT 34.5 (*)    All other components within normal limits  BASIC METABOLIC PANEL - Abnormal; Notable for the following:    Glucose, Bld 161 (*)    GFR calc non Af Amer 78 (*)    All other components within normal limits  GC/CHLAMYDIA PROBE AMP  URINALYSIS, ROUTINE W REFLEX MICROSCOPIC  PREGNANCY, URINE   US Transvaginal Non-ob  04/08/2012  *RADIOLOGY REPORT*  Clinical Data: Right lower quadrant pain.  Right ovarian cyst 2 years ago.  Prior appendectomy.  TRANSABDOMINAL AND TRANSVAGINAL ULTRASOUND OF PELVIS Technique:  Both transabdominal and transvaginal ultrasound examinations of the pelvis were performed. Transabdominal technique was performed for global imaging of the pelvis including uterus, ovaries, adnexal regions, and pelvic cul-de-sac.  It was necessary to proceed with endovaginal exam following the transabdominal exam to visualize the ovaries.  Comparison:  02/1968 07/26/2010 CT.  09/23/2009 ultrasound.  Findings:  Uterus: 9.5 x 4.7 x 4.7 cm.  No dominant mass.  Endometrium: 7 mm  Right ovary:  5.6 x 4.4 x 4.4 cm.  Dominant 4.6 x 3.8 x 3.6 cm cyst.  This previously measured 4.9 x 3.9 x 3.7 cm and therefore does not appear significantly different.  The slight echogenicity along the anterior wall is felt to represent artifact with the cyst appearing simple. Doppler analysis was not performed as this was not requested.  Left ovary: 3.9 x 2.0 x 2.4 cm.  No dominant mass.  Other findings: No free fluid  IMPRESSION: Dominant 4.6 x 3.8 x 3.6 cm right ovarian simple cyst.  This previously measured 4.9 x 3.9 x 3.7 cm and therefore does not appear significantly different.   Original Report Authenticated By: Lacy Duverney, M.D.    US Pelvis  Complete  04/08/2012  *RADIOLOGY REPORT*  Clinical Data: Right lower quadrant pain.  Right ovarian cyst 2 years ago.  Prior appendectomy.  TRANSABDOMINAL AND TRANSVAGINAL ULTRASOUND OF PELVIS Technique:  Both transabdominal and transvaginal ultrasound examinations of the pelvis  were performed. Transabdominal technique was performed for global imaging of the pelvis including uterus, ovaries, adnexal regions, and pelvic cul-de-sac.  It was necessary to proceed with endovaginal exam following the transabdominal exam to visualize the ovaries.  Comparison:  02/1968 07/26/2010 CT.  09/23/2009 ultrasound.  Findings:  Uterus: 9.5 x 4.7 x 4.7 cm.  No dominant mass.  Endometrium: 7 mm  Right ovary:  5.6 x 4.4 x 4.4 cm.  Dominant 4.6 x 3.8 x 3.6 cm cyst.  This previously measured 4.9 x 3.9 x 3.7 cm and therefore does not appear significantly different.  The slight echogenicity along the anterior wall is felt to represent artifact with the cyst appearing simple. Doppler analysis was not performed as this was not requested.  Left ovary: 3.9 x 2.0 x 2.4 cm.  No dominant mass.  Other findings: No free fluid  IMPRESSION: Dominant 4.6 x 3.8 x 3.6 cm right ovarian simple cyst.  This previously measured 4.9 x 3.9 x 3.7 cm and therefore does not appear significantly different.   Original Report Authenticated By: Lacy Duverney, M.D.      1. Ovarian cyst   2. BV (bacterial vaginosis)       MDM  Pt is feeling better at this time:will send home on percocet and flagyl and pt can follow up with her gyn        Teressa Lower, NP 04/08/12 1628

## 2012-04-09 ENCOUNTER — Emergency Department (HOSPITAL_BASED_OUTPATIENT_CLINIC_OR_DEPARTMENT_OTHER): Payer: PRIVATE HEALTH INSURANCE

## 2012-04-09 ENCOUNTER — Emergency Department (HOSPITAL_BASED_OUTPATIENT_CLINIC_OR_DEPARTMENT_OTHER)
Admission: EM | Admit: 2012-04-09 | Discharge: 2012-04-09 | Disposition: A | Discharge status: Home / Self Care | Payer: PRIVATE HEALTH INSURANCE | Attending: Emergency Medicine | Admitting: Emergency Medicine

## 2012-04-09 ENCOUNTER — Encounter (HOSPITAL_BASED_OUTPATIENT_CLINIC_OR_DEPARTMENT_OTHER): Payer: Self-pay

## 2012-04-09 DIAGNOSIS — Z79899 Other long term (current) drug therapy: Secondary | ICD-10-CM | POA: Insufficient documentation

## 2012-04-09 DIAGNOSIS — N83201 Unspecified ovarian cyst, right side: Secondary | ICD-10-CM

## 2012-04-09 DIAGNOSIS — E119 Type 2 diabetes mellitus without complications: Secondary | ICD-10-CM | POA: Insufficient documentation

## 2012-04-09 DIAGNOSIS — R112 Nausea with vomiting, unspecified: Secondary | ICD-10-CM | POA: Insufficient documentation

## 2012-04-09 DIAGNOSIS — R109 Unspecified abdominal pain: Secondary | ICD-10-CM | POA: Insufficient documentation

## 2012-04-09 DIAGNOSIS — IMO0002 Reserved for concepts with insufficient information to code with codable children: Secondary | ICD-10-CM | POA: Insufficient documentation

## 2012-04-09 DIAGNOSIS — Z8742 Personal history of other diseases of the female genital tract: Secondary | ICD-10-CM | POA: Insufficient documentation

## 2012-04-09 DIAGNOSIS — F411 Generalized anxiety disorder: Secondary | ICD-10-CM | POA: Insufficient documentation

## 2012-04-09 DIAGNOSIS — M549 Dorsalgia, unspecified: Secondary | ICD-10-CM

## 2012-04-09 DIAGNOSIS — Z87891 Personal history of nicotine dependence: Secondary | ICD-10-CM | POA: Insufficient documentation

## 2012-04-09 DIAGNOSIS — R10A0 Flank pain, unspecified side: Secondary | ICD-10-CM

## 2012-04-09 DIAGNOSIS — Z9089 Acquired absence of other organs: Secondary | ICD-10-CM | POA: Insufficient documentation

## 2012-04-09 DIAGNOSIS — N83209 Unspecified ovarian cyst, unspecified side: Secondary | ICD-10-CM | POA: Insufficient documentation

## 2012-04-09 DIAGNOSIS — Z87442 Personal history of urinary calculi: Secondary | ICD-10-CM | POA: Insufficient documentation

## 2012-04-09 DIAGNOSIS — I10 Essential (primary) hypertension: Secondary | ICD-10-CM | POA: Insufficient documentation

## 2012-04-09 DIAGNOSIS — Z7982 Long term (current) use of aspirin: Secondary | ICD-10-CM | POA: Insufficient documentation

## 2012-04-09 HISTORY — DX: Calculus of kidney: N20.0

## 2012-04-09 LAB — URINALYSIS, ROUTINE W REFLEX MICROSCOPIC
Ketones, ur: NEGATIVE mg/dL
Leukocytes, UA: NEGATIVE
Nitrite: NEGATIVE
Specific Gravity, Urine: 1.029 (ref 1.005–1.030)
pH: 5 (ref 5.0–8.0)

## 2012-04-09 LAB — CBC WITH DIFFERENTIAL/PLATELET
Basophils Absolute: 0 10*3/uL (ref 0.0–0.1)
Basophils Relative: 0 % (ref 0–1)
Hemoglobin: 11.5 g/dL — ABNORMAL LOW (ref 12.0–15.0)
MCHC: 33.7 g/dL (ref 30.0–36.0)
Neutro Abs: 7.7 10*3/uL (ref 1.7–7.7)
Neutrophils Relative %: 73 % (ref 43–77)
RDW: 13.2 % (ref 11.5–15.5)

## 2012-04-09 LAB — LIPASE, BLOOD: Lipase: 15 U/L (ref 11–59)

## 2012-04-09 LAB — BASIC METABOLIC PANEL
Chloride: 98 mEq/L (ref 96–112)
GFR calc Af Amer: >90 mL/min (ref >90)
Potassium: 3.7 mEq/L (ref 3.5–5.1)
Sodium: 136 mEq/L (ref 135–145)

## 2012-04-09 LAB — GC/CHLAMYDIA PROBE AMP: GC Probe RNA: NEGATIVE

## 2012-04-09 LAB — HEPATIC FUNCTION PANEL
Bilirubin, Direct: <0.1 mg/dL (ref 0.0–0.3)
Total Protein: 8.1 g/dL (ref 6.0–8.3)

## 2012-04-09 MED ORDER — HYDROMORPHONE HCL PF 1 MG/ML IJ SOLN
1.0000 mg | Freq: Once | INTRAMUSCULAR | Status: AC
  Administered 2012-04-09: 1 mg via INTRAVENOUS
  Filled 2012-04-09: qty 1

## 2012-04-09 MED ORDER — HYDROMORPHONE HCL PF 1 MG/ML IJ SOLN
1.0000 mg | Freq: Once | INTRAMUSCULAR | Status: AC
  Administered 2012-04-09: 1 mg via INTRAVENOUS

## 2012-04-09 MED ORDER — HYDROMORPHONE HCL PF 1 MG/ML IJ SOLN
INTRAMUSCULAR | Status: AC
  Filled 2012-04-09: qty 1

## 2012-04-09 MED ORDER — SODIUM CHLORIDE 0.9 % IV SOLN: INTRAVENOUS | Status: DC

## 2012-04-09 MED ORDER — ONDANSETRON 4 MG PO TBDP: 4.0000 mg | ORAL_TABLET | Freq: Three times a day (TID) | ORAL | Status: DC | PRN

## 2012-04-09 MED ORDER — PROMETHAZINE HCL 25 MG PO TABS: 12.0000 mg | ORAL_TABLET | Freq: Four times a day (QID) | ORAL | Status: DC | PRN

## 2012-04-09 MED ORDER — HYDROMORPHONE HCL 2 MG PO TABS: 2.0000 mg | ORAL_TABLET | ORAL | Status: DC | PRN

## 2012-04-09 MED ORDER — ONDANSETRON HCL 4 MG/2ML IJ SOLN
4.0000 mg | Freq: Once | INTRAMUSCULAR | Status: AC
  Administered 2012-04-09: 4 mg via INTRAVENOUS
  Filled 2012-04-09: qty 2

## 2012-04-09 MED ORDER — SODIUM CHLORIDE 0.9 % IV BOLUS (SEPSIS)
1000.0000 mL | Freq: Once | INTRAVENOUS | Status: AC
  Administered 2012-04-09: 1000 mL via INTRAVENOUS

## 2012-04-09 NOTE — ED Notes (Signed)
Pt reports she awakened with right flank pain at 0200 this am associated with nausea and vomiting.  She was seen in ED yesterday and diagnosed with an ovarian cyst.  She has been taking Percocet without relief.

## 2012-04-09 NOTE — ED Notes (Signed)
MD at bedside. 

## 2012-04-09 NOTE — ED Provider Notes (Signed)
History/physical exam/procedure(s) were performed by non-physician practitioner and as supervising physician I was immediately available for consultation/collaboration. I have reviewed all notes and am in agreement with care and plan.   Kevin Mario S Setsuko Robins, MD 04/09/12 0959 

## 2012-04-09 NOTE — ED Notes (Signed)
Patient transported to CT 

## 2012-04-09 NOTE — ED Provider Notes (Signed)
History     CSN: [id number]  Arrival date & time 04/09/12  0708   First MD Initiated Contact with Patient 04/09/12 [phone]      Chief Complaint  Patient presents with  . Flank Pain  . Emesis    (Consider location/radiation/quality/duration/timing/severity/associated sxs/prior treatment) Patient is a 42 y.o. female presenting with flank pain and vomiting. The history is provided by the patient.  Flank Pain This is a new problem. The current episode started 3 to 5 hours ago. The problem occurs constantly. The problem has not changed since onset.Associated symptoms include abdominal pain. Pertinent negatives include no chest pain, no headaches and no shortness of breath. The symptoms are aggravated by walking. Nothing relieves the symptoms.  Emesis Associated symptoms: abdominal pain   Associated symptoms: no diarrhea and no headaches    Patient seen here yesterday for right lower quadrant groin pain with pelvic ultrasound and pelvic exam that showed a large ovarian cyst that has been present for a long period of time. Patient with new pain onset at 2 in the morning right low back right flank severe unrelenting 10 out of 10 described as sharp. Made worse with walking. Reminds her of her past history of kidney stones. Associated with nausea and vomiting.  Past Medical History  Diagnosis Date  . Diabetes mellitus   . Hypertension   . Anxiety   . Fibroid tumor   . 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 Ovarian cyst   . Kidney stone     Past Surgical History  Procedure Laterality Date  . Cholecystectomy    . Appendectomy    . Tonsillectomy      Family History  Problem Relation Age of Onset  . Hypertension Mother   . Diabetes Maternal Grandmother   . Hypertension Maternal Grandmother   . Breast cancer Maternal Grandmother   . Diabetes Maternal Grandfather   . Hypertension Maternal Grandfather     History  Substance Use Topics  . Smoking status:  Former Games developer  . Smokeless tobacco: Never Used  . Alcohol Use: Yes     Comment: social    OB History   Grav Para Term Preterm Abortions TAB SAB Ect Mult Living   2 2        2       Review of Systems  Constitutional: Negative for fever.  HENT: Negative for congestion.   Eyes: Negative for redness.  Respiratory: Negative for shortness of breath.   Cardiovascular: Negative for chest pain.  Gastrointestinal: Positive for nausea, vomiting and abdominal pain. Negative for diarrhea.  Genitourinary: Positive for flank pain.  Musculoskeletal: Positive for back pain.  Skin: Negative for rash.  Neurological: Negative for headaches.  Hematological: Does not bruise/bleed easily.    Allergies  Review of patient's allergies indicates no known allergies.  Home Medications   Current Outpatient Rx  Name  Route  Sig  Dispense  Refill  . amoxicillin (AMOXIL) 500 MG capsule   Oral   Take 500 mg by mouth 3 (three) times daily.         Marland Kitchen aspirin 81 MG tablet   Oral   Take 81 mg by mouth daily.         . cetirizine-pseudoephedrine (ZYRTEC-D) 5-120 MG per tablet   Oral   Take 2 tablets by mouth daily as needed. For congestion          . fluticasone (FLONASE) 50 MCG/ACT nasal spray  Nasal   Place 2 sprays into the nose daily.   16 g   0   . HYDROcodone-acetaminophen (NORCO/VICODIN) 5-325 MG per tablet   Oral   Take 1 tablet by mouth every 6 (six) hours as needed.         Marland Kitchen HYDROmorphone (DILAUDID) 2 MG tablet   Oral   Take 1 tablet (2 mg total) by mouth every 4 (four) hours as needed for pain.   20 tablet   0   . ibuprofen (ADVIL,MOTRIN) 800 MG tablet   Oral   Take 800 mg by mouth every 8 (eight) hours as needed. For pain          . lisinopril-hydrochlorothiazide (PRINZIDE,ZESTORETIC) 20-25 MG per tablet   Oral   Take 1 tablet by mouth daily.           . metFORMIN (GLUCOPHAGE) 1000 MG tablet   Oral   Take 1,000 mg by mouth 2 (two) times daily.           .  metroNIDAZOLE (FLAGYL) 500 MG tablet   Oral   Take 1 tablet (500 mg total) by mouth 2 (two) times daily.   14 tablet   0   . Multiple Vitamin (MULTIVITAMIN) tablet   Oral   Take 1 tablet by mouth daily. CENTRUM          . ondansetron (ZOFRAN ODT) 4 MG disintegrating tablet   Oral   Take 1 tablet (4 mg total) by mouth every 8 (eight) hours as needed for nausea.   10 tablet   0   . oxyCODONE-acetaminophen (PERCOCET/ROXICET) 5-325 MG per tablet   Oral   Take 2 tablets by mouth every 4 (four) hours as needed for pain.   6 tablet   0   . promethazine (PHENERGAN) 25 MG tablet   Oral   Take 0.5 tablets (12.5 mg total) by mouth every 6 (six) hours as needed for nausea.   30 tablet   0   . traMADol (ULTRAM) 50 MG tablet   Oral   Take 50 mg by mouth every 6 (six) hours as needed.           BP 115/70  Pulse 61  Temp(Src) 97.5 F (36.4 C) (Oral)  Resp 16  SpO2 98%  LMP 03/31/2012  Physical Exam  Nursing note and vitals reviewed. Constitutional: She is oriented to person, place, and time. She appears well-developed and well-nourished. No distress.  HENT:  Head: Normocephalic and atraumatic.  Mouth/Throat: Oropharynx is clear and moist.  Eyes: Conjunctivae and EOM are normal. Pupils are equal, round, and reactive to light.  Neck: Normal range of motion.  Cardiovascular: Normal rate.   No murmur heard. Pulmonary/Chest: Effort normal and breath sounds normal. No respiratory distress.  Abdominal: Soft. Bowel sounds are normal. There is tenderness.  Musculoskeletal: Normal range of motion.  Neurological: She is alert and oriented to person, place, and time. No cranial nerve deficit. She exhibits normal muscle tone. Coordination normal.  Skin: Skin is warm. No rash noted.    ED Course  Procedures (including critical care time)  Patient clinically acting as if this is a kidney stone. Even though there was some right CVA discomfort. Urinalysis negative for hematuria CT to  evaluate for ureteral stone is still pending.    Labs Reviewed  CBC WITH DIFFERENTIAL - Abnormal; Notable for the following:    Hemoglobin 11.5 (*)    HCT 34.1 (*)    All other components  within normal limits  BASIC METABOLIC PANEL - Abnormal; Notable for the following:    Glucose, Bld 206 (*)    All other components within normal limits  URINALYSIS, ROUTINE W REFLEX MICROSCOPIC - Abnormal; Notable for the following:    Glucose, UA 100 (*)    All other components within normal limits  HEPATIC FUNCTION PANEL - Abnormal; Notable for the following:    Total Bilirubin 0.2 (*)    All other components within normal limits  LIPASE, BLOOD   Ct Abdomen Pelvis Wo Contrast  04/09/2012  *RADIOLOGY REPORT*  Clinical Data: Flank pain.  Emesis.  CT ABDOMEN AND PELVIS WITHOUT CONTRAST  Technique:  Multidetector CT imaging of the abdomen and pelvis was performed following the standard protocol without intravenous contrast.  Comparison: CT of the abdomen and pelvis 03/02/2010.  Findings:  Lung Bases: Unremarkable.  Abdomen/Pelvis:  There are no abnormal calcifications within the collecting system of either kidney, along the course of either ureter, or within the lumen of the urinary bladder to suggest urinary tract calculi.  No hydroureteronephrosis or perinephric stranding to suggest urinary tract obstruction at this time.  Image 38 of series 2 demonstrates a calcification in close proximity to the proximal left ureter, however, this is within the left gonadal vein and is compatible with a phlebolith.  The patient is status post cholecystectomy.  The unenhanced appearance of the liver, pancreas, spleen and bilateral adrenal glands is unremarkable.  There is a trace volume of free fluid in the cul-de-sac, presumably physiologic.  No larger volume of ascites is noted.  No pneumoperitoneum.  No pathologic distension of small bowel.  Urinary bladder is nearly completely decompressed. Numerous pelvic phleboliths are  noted.  Uterus and left ovary is unremarkable.  The right ovary there is a well-defined 4.4 x 4.7 cm low attenuation lesion, recently characterized as a simple cyst by ultrasound on 04/08/2012.  No definite pathologic lymphadenopathy identified within the abdomen or pelvis on this noncontrast CT examination.  Musculoskeletal: There are no aggressive appearing lytic or blastic lesions noted in the visualized portions of the skeleton.  IMPRESSION: 1.  No acute abnormality in the abdomen or pelvis to account for the patient's symptoms. 2.  4.4 x 4.7 cm well-defined low attenuation lesion in the right adnexa is compatible with the ovarian cyst as demonstrated by reaches the recent pelvic ultrasound examination 04/08/2012. 3.  Status post cholecystectomy. 4.  Small volume of free fluid the cul-de-sac is presumably physiologic in this female patient. 5.  Additional incidental findings, as above.   Original Report Authenticated By: Trudie Reed, M.D.    Dg Chest 2 View  04/09/2012  *RADIOLOGY REPORT*  Clinical Data:  Right-sided flank pain.  CHEST - 2 VIEW  Comparison: 11/15/2010  Findings: The heart size and mediastinal contours are within normal limits.  Both lungs are clear.  The visualized skeletal structures are unremarkable.  IMPRESSION: No active disease.   Original Report Authenticated By: Irish Lack, M.D.    US Transvaginal Non-ob  04/08/2012  *RADIOLOGY REPORT*  Clinical Data: Right lower quadrant pain.  Right ovarian cyst 2 years ago.  Prior appendectomy.  TRANSABDOMINAL AND TRANSVAGINAL ULTRASOUND OF PELVIS Technique:  Both transabdominal and transvaginal ultrasound examinations of the pelvis were performed. Transabdominal technique was performed for global imaging of the pelvis including uterus, ovaries, adnexal regions, and pelvic cul-de-sac.  It was necessary to proceed with endovaginal exam following the transabdominal exam to visualize the ovaries.  Comparison:  [birth year]/1968 07/26/2010 CT.  09/23/2009 ultrasound.  Findings:  Uterus: 9.5 x 4.7 x 4.7 cm.  No dominant mass.  Endometrium: 7 mm  Right ovary:  5.6 x 4.4 x 4.4 cm.  Dominant 4.6 x 3.8 x 3.6 cm cyst.  This previously measured 4.9 x 3.9 x 3.7 cm and therefore does not appear significantly different.  The slight echogenicity along the anterior wall is felt to represent artifact with the cyst appearing simple. Doppler analysis was not performed as this was not requested.  Left ovary: 3.9 x 2.0 x 2.4 cm.  No dominant mass.  Other findings: No free fluid  IMPRESSION: Dominant 4.6 x 3.8 x 3.6 cm right ovarian simple cyst.  This previously measured 4.9 x 3.9 x 3.7 cm and therefore does not appear significantly different.   Original Report Authenticated By: Lacy Duverney, M.D.    US Pelvis Complete  04/08/2012  *RADIOLOGY REPORT*  Clinical Data: Right lower quadrant pain.  Right ovarian cyst 2 years ago.  Prior appendectomy.  TRANSABDOMINAL AND TRANSVAGINAL ULTRASOUND OF PELVIS Technique:  Both transabdominal and transvaginal ultrasound examinations of the pelvis were performed. Transabdominal technique was performed for global imaging of the pelvis including uterus, ovaries, adnexal regions, and pelvic cul-de-sac.  It was necessary to proceed with endovaginal exam following the transabdominal exam to visualize the ovaries.  Comparison:  02/1968 07/26/2010 CT.  09/23/2009 ultrasound.  Findings:  Uterus: 9.5 x 4.7 x 4.7 cm.  No dominant mass.  Endometrium: 7 mm  Right ovary:  5.6 x 4.4 x 4.4 cm.  Dominant 4.6 x 3.8 x 3.6 cm cyst.  This previously measured 4.9 x 3.9 x 3.7 cm and therefore does not appear significantly different.  The slight echogenicity along the anterior wall is felt to represent artifact with the cyst appearing simple. Doppler analysis was not performed as this was not requested.  Left ovary: 3.9 x 2.0 x 2.4 cm.  No dominant mass.  Other findings: No free fluid  IMPRESSION: Dominant 4.6 x 3.8 x 3.6 cm right ovarian simple cyst.  This  previously measured 4.9 x 3.9 x 3.7 cm and therefore does not appear significantly different.   Original Report Authenticated By: Lacy Duverney, M.D.    Results for orders placed during the hospital encounter of 04/09/12  CBC WITH DIFFERENTIAL      Result Value Range   WBC 10.5  4.0 - 10.5 K/uL   RBC 4.11  3.87 - 5.11 MIL/uL   Hemoglobin 11.5 (*) 12.0 - 15.0 g/dL   HCT 16.1 (*) 09.6 - 04.5 %   MCV 83.0  78.0 - 100.0 fL   MCH 28.0  26.0 - 34.0 pg   MCHC 33.7  30.0 - 36.0 g/dL   RDW 40.9  81.1 - 91.4 %   Platelets 385  150 - 400 K/uL   Neutrophils Relative 73  43 - 77 %   Neutro Abs 7.7  1.7 - 7.7 K/uL   Lymphocytes Relative 20  12 - 46 %   Lymphs Abs 2.1  0.7 - 4.0 K/uL   Monocytes Relative 6  3 - 12 %   Monocytes Absolute 0.6  0.1 - 1.0 K/uL   Eosinophils Relative 1  0 - 5 %   Eosinophils Absolute 0.1  0.0 - 0.7 K/uL   Basophils Relative 0  0 - 1 %   Basophils Absolute 0.0  0.0 - 0.1 K/uL  BASIC METABOLIC PANEL      Result Value Range   Sodium 136  135 -  145 mEq/L   Potassium 3.7  3.5 - 5.1 mEq/L   Chloride 98  96 - 112 mEq/L   CO2 27  19 - 32 mEq/L   Glucose, Bld 206 (*) 70 - 99 mg/dL   BUN 16  6 - 23 mg/dL   Creatinine, Ser 4.78  0.50 - 1.10 mg/dL   Calcium 9.3  8.4 - 29.5 mg/dL   GFR calc non Af Amer >90  >90 mL/min   GFR calc Af Amer >90  >90 mL/min  URINALYSIS, ROUTINE W REFLEX MICROSCOPIC      Result Value Range   Color, Urine YELLOW  YELLOW   APPearance CLEAR  CLEAR   Specific Gravity, Urine 1.029  1.005 - 1.030   pH 5.0  5.0 - 8.0   Glucose, UA 100 (*) NEGATIVE mg/dL   Hgb urine dipstick NEGATIVE  NEGATIVE   Bilirubin Urine NEGATIVE  NEGATIVE   Ketones, ur NEGATIVE  NEGATIVE mg/dL   Protein, ur NEGATIVE  NEGATIVE mg/dL   Urobilinogen, UA 0.2  0.0 - 1.0 mg/dL   Nitrite NEGATIVE  NEGATIVE   Leukocytes, UA NEGATIVE  NEGATIVE  HEPATIC FUNCTION PANEL      Result Value Range   Total Protein 8.1  6.0 - 8.3 g/dL   Albumin 3.9  3.5 - 5.2 g/dL   AST 16  0 - 37 U/L    ALT 19  0 - 35 U/L   Alkaline Phosphatase 89  39 - 117 U/L   Total Bilirubin 0.2 (*) 0.3 - 1.2 mg/dL   Bilirubin, Direct <6.2  0.0 - 0.3 mg/dL   Indirect Bilirubin NOT CALCULATED  0.3 - 0.9 mg/dL  LIPASE, BLOOD      Result Value Range   Lipase 15  11 - 59 U/L     1. Ovarian cyst, right   2. Back pain   3. Flank pain       MDM  Extensive workup here in the emergency apartment without any new findings other than the large right ovarian cyst. This may be responsible for pain. No evidence of kidney stone or ureteral stone. No evidence of appendicitis labs no significant leukocytosis liver function test are normal and chest x-rays negative for pneumonia pneumothorax. Will supplement with additional pain medication antinausea medicine. Have patient followup with her OB/GYN.        Shelda Jakes, MD 04/09/12 [phone]

## 2012-04-09 NOTE — ED Notes (Signed)
Pt reports she awakened at 0200 with right flank pain and vomiting.

## 2012-04-10 ENCOUNTER — Ambulatory Visit (INDEPENDENT_AMBULATORY_CARE_PROVIDER_SITE_OTHER): Payer: PRIVATE HEALTH INSURANCE | Admitting: Gynecology

## 2012-04-10 ENCOUNTER — Encounter: Payer: Self-pay | Admitting: Gynecology

## 2012-04-10 DIAGNOSIS — G8929 Other chronic pain: Secondary | ICD-10-CM

## 2012-04-10 DIAGNOSIS — R1031 Right lower quadrant pain: Secondary | ICD-10-CM

## 2012-04-10 DIAGNOSIS — N83209 Unspecified ovarian cyst, unspecified side: Secondary | ICD-10-CM

## 2012-04-10 NOTE — Patient Instructions (Signed)
Office will contact you to discuss treatment plan.

## 2012-04-10 NOTE — Progress Notes (Signed)
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 She's had several year history of a persistent right adnexal/ovarian cyst in the 3-4 cm range which has remained stable and chronic right lower quadrant pain. She was evaluated at [facility] by Dr. Mia Creek where surgery was not recommended due to to her history of the abscess for fear of significant adhesive disease and was treated with intermittent pain medication. Her pain mostly occurred once a month lasting a day or 2 and sometimes she will go 2 months without pain. She also was pursuing pregnancy but had a history of attempted BTL which was aborted due to what he said disease and a followup HSG was done which showed a patent left tube and a distal obstruction in the right tube. Patient was evaluated over the last 2 days on 2 separate occasions in the emergency room for right lower quadrant pain requiring narcotic medication. Ultrasound initially at first visit showed a right ovarian dominant 4.6 x 3.8 x 3.6 cm cyst and otherwise was unremarkable.  She returned to the emergency room the following day and ultimately had a CT scan which confirmed the presence of the right ovarian cyst but otherwise was unremarkable. Most recent ultrasound in the office was January 2013 which showed an avascular echo-free cyst with a 46 mm mean.  Exam with Selena Batten assistant Spine straight without CVA tenderness Abdomen soft nontender without masses guarding rebound organomegaly. Pelvic bimanual without gross masses or tenderness. Limited by abdominal girth.  Assessment and plan: Chronic right lower quadrant intermittent pain usually occurs once a month lasting one or 2 days thought to be ovulatory related. Persistent right ovarian cyst simple avascular.  History of right lower quadrant abdominal wall and pericecal abscess. Reviewed again with patient reluctant to consider surgery for fear of significant adhesive disease and the risks associated with this type of dissection to include bowel injury ostomy formation.  Her pain is episodic and seems to be menstrual time. Does not occur every month. Ovulatory suppression certainly one possibility. Low dose oral contraceptives continuously with every three-month withdrawal an option. She does have a history of diabetes and hypertension but these appear to be controlled. She also has a history of cigarette smoking but this is in excess of 15 years ago. I reviewed the risks of low-dose pills to include stroke heart attack DVT particularly with her other issues. Alternatives such as Depo-Provera or Depo-Lupron discussed. At the time of her visit I asked her to delay decision until I have a chance to review her chart completely as it is complex and I have since done so and will followup with her and discussed her treatment plan that we mutually agree upon.

## 2012-04-21 ENCOUNTER — Telehealth: Payer: Self-pay | Admitting: Gynecology

## 2012-04-21 NOTE — Telephone Encounter (Signed)
Called patient in followup of our last encounter. I reviewed old records and I again discussed options for management of her pain. She has occasional one to two-day ovulatory associated pain. Does not occur every month. Options of ovulatory suppression to include GnRH agonist, progesterone only such as Depo-Provera or low-dose birth control pills. Side effect profiles and risks discussed particularly with birth control pills and her history of blood pressure, diabetes and prior smoking history. Alternatives such as pain medication intermittently. After lengthy discussion we both agree on pain medication intermittently she is only needing it for one or 2 days monthly. Will approve FMLA for several days each month as needed for pain. She did ask me about getting pregnant whether she should pursue this with a fertility doctor. I reviewed that certainly this is her decision. She has medical risks, her age, chromosomal risks and her abdominal adhesive history which may complicate if she had an ectopic pregnancy or a C-section requiring surgery. At this point patient will call me if she needs additional pain medication and will submit papers for FMLA to allow several days monthly as needed.

## 2012-05-06 ENCOUNTER — Encounter: Payer: Self-pay | Admitting: Gynecology

## 2012-05-06 ENCOUNTER — Other Ambulatory Visit (HOSPITAL_COMMUNITY)
Admission: RE | Admit: 2012-05-06 | Discharge: 2012-05-06 | Disposition: A | Discharge status: Home / Self Care | Payer: PRIVATE HEALTH INSURANCE | Source: Ambulatory Visit | Attending: Gynecology | Admitting: Gynecology

## 2012-05-06 ENCOUNTER — Ambulatory Visit (INDEPENDENT_AMBULATORY_CARE_PROVIDER_SITE_OTHER): Payer: PRIVATE HEALTH INSURANCE | Admitting: Gynecology

## 2012-05-06 VITALS — BP 114/74 | Ht 65.0 in | Wt 227.0 lb

## 2012-05-06 DIAGNOSIS — Z13220 Encounter for screening for lipoid disorders: Secondary | ICD-10-CM

## 2012-05-06 DIAGNOSIS — Z01419 Encounter for gynecological examination (general) (routine) without abnormal findings: Secondary | ICD-10-CM

## 2012-05-06 DIAGNOSIS — N736 Female pelvic peritoneal adhesions (postinfective): Secondary | ICD-10-CM

## 2012-05-06 DIAGNOSIS — Z1151 Encounter for screening for human papillomavirus (HPV): Secondary | ICD-10-CM | POA: Insufficient documentation

## 2012-05-06 LAB — COMPREHENSIVE METABOLIC PANEL
ALT: 14 U/L (ref 0–35)
Albumin: 4 g/dL (ref 3.5–5.2)
Alkaline Phosphatase: 82 U/L (ref 39–117)
CO2: 26 mEq/L (ref 19–32)
Glucose, Bld: 156 mg/dL — ABNORMAL HIGH (ref 70–99)
Potassium: 3.9 mEq/L (ref 3.5–5.3)
Sodium: 135 mEq/L (ref 135–145)
Total Bilirubin: 0.3 mg/dL (ref 0.3–1.2)
Total Protein: 7.3 g/dL (ref 6.0–8.3)

## 2012-05-06 LAB — CBC WITH DIFFERENTIAL/PLATELET
Eosinophils Absolute: 0.2 10*3/uL (ref 0.0–0.7)
Hemoglobin: 11.4 g/dL — ABNORMAL LOW (ref 12.0–15.0)
Lymphs Abs: 3.2 10*3/uL (ref 0.7–4.0)
Monocytes Relative: 5 % (ref 3–12)
Neutro Abs: 5.9 10*3/uL (ref 1.7–7.7)
Neutrophils Relative %: 60 % (ref 43–77)
Platelets: 427 10*3/uL — ABNORMAL HIGH (ref 150–400)
RBC: 4.17 MIL/uL (ref 3.87–5.11)
WBC: 9.9 10*3/uL (ref 4.0–10.5)

## 2012-05-06 LAB — LIPID PANEL
Cholesterol: 182 mg/dL (ref 0–200)
LDL Cholesterol: 116 mg/dL — ABNORMAL HIGH (ref 0–99)
Triglycerides: 153 mg/dL — ABNORMAL HIGH (ref <150)
VLDL: 31 mg/dL (ref 0–40)

## 2012-05-06 NOTE — Addendum Note (Signed)
Addended by: Dayna Barker on: 05/06/2012 03:52 PM   Modules accepted: Orders

## 2012-05-06 NOTE — Progress Notes (Signed)
Holly Wells 18-Sep-1970 [id number]        42 y.o.  [id number] for annual exam.  Several issues noted below.  Past medical history,surgical history, medications, allergies, family history and social history were all reviewed and documented in the EPIC chart. ROS:  Was performed and pertinent positives and negatives are included in the history.  Exam: Kim assistant Filed Vitals:   05/06/12 1512  BP: 114/74  Height: 5\' 5"  (1.651 m)  Weight: 227 lb (102.967 kg)   General appearance  Normal Skin grossly normal Head/Neck normal with no cervical or supraclavicular adenopathy thyroid normal Lungs  clear Cardiac RR, without RMG Abdominal  soft, nontender, without masses, organomegaly or hernia Breasts  examined lying and sitting without masses, retractions, discharge or axillary adenopathy. Pelvic  Ext/BUS/vagina  normal   Cervix  normal Pap/HPV  Uterus  difficult to palpate but grossly normal size, nontender   Adnexa  Without gross masses or tenderness    Anus and perineum  normal   Rectovaginal  normal sphincter tone without palpated masses or tenderness.    Assessment/Plan:  42 y.o. [id number] female for annual exam.   1. 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 She's had several year history of a persistent right adnexal/ovarian cyst in the 3-4 cm range which has remained stable and chronic right lower quadrant pain. She was evaluated at [facility] by Dr. Mia Creek where surgery was not recommended due to to her history of the abscess for fear of significant adhesive disease and was treated with intermittent pain medication. Her pain mostly occurred once a month lasting a day or 2 and sometimes she will go 2 months without pain.  Patient was evaluated several times recently in the  emergency room for right lower quadrant pain requiring narcotic medication. Ultrasound initially at first visit showed a right ovarian dominant 4.6 x 3.8 x 3.6 cm cyst and otherwise was unremarkable. She returned to the emergency room the following day and ultimately had a CT scan which confirmed the presence of the right ovarian cyst but otherwise was unremarkable. Most recent ultrasound in the office was January 2013 which showed an avascular echo-free cyst with a 46 mm mean.  We've discussed the situation and the options to include ovulatory suppression.  She does have a history of diabetes and hypertension but these appear to be controlled. She also has a history of cigarette smoking but this is in excess of 15 years ago. We've decided on intermittent pain medication as her pain seems sporadic with FMLA excuse to miss work several days monthly as needed. Patient's comfortable with this plan. At this point I think no further evaluation such as ultrasound or scanning is necessary. 2. Pregnancy trial. Patient continues to want to achieve pregnancy. She again has a complex history to include attempted postpartum tubal that was aborted due to to adhesions. We did a followup HSG that shows her right side is blocked but the left side seems patent. We've discussed again as in the past the issues of pregnancy trial age 70 with hypertension diabetes and her significant adhesive disease. She understands the risks of chromosomal abnormalities as well as accelerated maternal disease during pregnancy. If she would achieve pregnancy and had an ectopic pregnancy the risk of surgery due to her pelvic adhesions was also discussed. She  does not want contraception understands and accepts all the above risks. Options for reproductive endocrinology referral discussed although not sure given her history that she would want to actively pursue ovulation induction or other forms of fertility management and she agrees with no  referral. 3. Mammography. Patient's mammogram I have recommended baseline she agrees to schedule. SBE monthly reviewed. 4. Pap smear 2013. I cannot retrieve results and I did a Pap smear/HPV. She has no history of significant abnormal Pap smears assuming this is normal then we'll plan less frequent screening intervals. 5. Health maintenance. Has not had blood work apparently checked in some time I ordered a baseline CBC comprehensive metabolic panel lipid profile urinalysis. Followup one year, sooner as needed.     Dara Lords MD, 3:40 PM 05/06/2012

## 2012-05-06 NOTE — Patient Instructions (Signed)
Call to Schedule your mammogram  Facilities in [place]: 1)  [facility], [address] Phone: [phone] 2)  [facility], [address] Phone: [phone] 3)  Dr. Bertrand at [facility]  [address] Phone: [phone]     Mammogram A mammogram is an X-ray test to find changes in a woman's breast. You should get a mammogram if:  You are 40 years of age or older  You have risk factors.   Your doctor recommends that you have one.  BEFORE THE TEST  Do not schedule the test the week before your period, especially if your breasts are sore during this time.  On the day of your mammogram:  Wash your breasts and armpits well. After washing, do not put on any deodorant or talcum powder on until after your test.   Eat and drink as you usually do.   Take your medicines as usual.   If you are diabetic and take insulin, make sure you:   Eat before coming for your test.   Take your insulin as usual.   If you cannot keep your appointment, call before the appointment to cancel. Schedule another appointment.  TEST  You will need to undress from the waist up. You will put on a hospital gown.   Your breast will be put on the mammogram machine, and it will press firmly on your breast with a piece of plastic called a compression paddle. This will make your breast flatter so that the machine can X-ray all parts of your breast.   Both breasts will be X-rayed. Each breast will be X-rayed from above and from the side. An X-ray might need to be taken again if the picture is not good enough.   The mammogram will last about 15 to 30 minutes.  AFTER THE TEST Finding out the results of your test Ask when your test results will be ready. Make sure you get your test results.  Document Released: 04/20/2008 Document Revised: 01/11/2011 Document Reviewed: 04/20/2008 [employer] Patient  Information 2012 [employer].   

## 2012-05-07 LAB — URINALYSIS W MICROSCOPIC + REFLEX CULTURE
Casts: NONE SEEN
Crystals: NONE SEEN
Ketones, ur: NEGATIVE mg/dL
Nitrite: NEGATIVE
Specific Gravity, Urine: 1.026 (ref 1.005–1.030)
Urobilinogen, UA: 0.2 mg/dL (ref 0.0–1.0)
pH: 5 (ref 5.0–8.0)

## 2012-06-05 ENCOUNTER — Ambulatory Visit (INDEPENDENT_AMBULATORY_CARE_PROVIDER_SITE_OTHER): Payer: PRIVATE HEALTH INSURANCE | Admitting: Gynecology

## 2012-06-05 ENCOUNTER — Encounter: Payer: Self-pay | Admitting: Gynecology

## 2012-06-05 DIAGNOSIS — N83209 Unspecified ovarian cyst, unspecified side: Secondary | ICD-10-CM

## 2012-06-05 DIAGNOSIS — R102 Pelvic and perineal pain: Secondary | ICD-10-CM

## 2012-06-05 DIAGNOSIS — N949 Unspecified condition associated with female genital organs and menstrual cycle: Secondary | ICD-10-CM

## 2012-06-05 NOTE — Patient Instructions (Signed)
Office will contact you to arrange appointment with gynecologic oncology surgeon

## 2012-06-05 NOTE — Progress Notes (Signed)
Patient reports daily pain right lower quadrant that's interfering with work. Has been using Dilaudid that she received from the emergency room. Had questions about altering her work status up to and including disability. Most recently seen 05/06/2012 where my note outlines her complex history. No GI symptoms such as nausea vomiting diarrhea constipation. No urinary symptoms.  Exam of Kim assistant Abdomen soft with active bowel sounds no rebound guarding masses or organomegaly. No acute tenderness to deep palpation. Pelvic external BUS vagina normal. Cervix normal. Uterus grossly normal in size midline mobile nontender. Adnexa without masses or tenderness  Assessment and plan: Chronic right sided pain persistent small right adnexal presumed ovarian cyst stable over years observation. Pain seems to be getting worse and more frequent. 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 Had seen Dr. Mia Creek who is also not interested in doing surgery at that time. Options for management now include menstrual suppression such as Depo-Lupron to see if this would not help with her pain. She does have diabetes hypertension and a former smoker and do not think birth control pill suppression wise. Surgery with right salpingo-oophorectomy were considering TAH RSO/BSO if she was at that point also discussed. I think given the potential for significant adhesive disease and distortion of anatomy a gynecologic oncologist would be a great choice if surgery is performed. I do not want to see her staying on narcotic pain medication I reviewed the risk of addiction with her. I also reviewed the permanent disability would not be appropriate given her young age I think we need to resolve her pain which I think ultimately will require surgery. I am going to set up an appointment with a gynecologic oncologist to  further pursue the surgical possibilities.

## 2012-06-09 ENCOUNTER — Telehealth: Payer: Self-pay | Admitting: *Deleted

## 2012-06-09 NOTE — Telephone Encounter (Signed)
Left message on voicemail at oncologist office to make appointment and let office know time and date.

## 2012-06-09 NOTE — Telephone Encounter (Signed)
Message copied by Aura Camps on Mon Jun 09, 2012 11:50 AM ------      Message from: Dara Lords      Created: Mon Jun 09, 2012 10:45 AM       Victorino Dike, this is the patient needs an appointment with gynecologic oncologist due to pain, cystic mass in the adnexa and history of significant pelvic adhesions. Her pain seems to be getting worse and she probably will require surgery that I think would be better served by a gynecologic oncologist. ------

## 2012-06-11 ENCOUNTER — Telehealth: Payer: Self-pay

## 2012-06-11 NOTE — Telephone Encounter (Signed)
Victorino Dike informed. She will contact [facility] to schedule and notify patient.

## 2012-06-11 NOTE — Telephone Encounter (Signed)
Dr. Stanford Breed has reviewed her records.  He declines to see her or do surgery.  He said her chief problem is pelvic pain and he is recommending that you refer her to the [facility].  Dr. Ceasar Lund, M.D. [phone]) is who he recommends she see.

## 2012-06-11 NOTE — Telephone Encounter (Signed)
Okay to refer her to there.

## 2012-06-11 NOTE — Telephone Encounter (Signed)
Dr. Stanford Breed has reviewed her records. He declines to see her or do surgery. He said her chief problem is pelvic pain and he is recommending that you refer her to the [facility] at [facility]. Dr. Ceasar Lund, M.D. [phone]) is who he recommends she see.    I faxed office notes and images the office for pain clinic they will schedule and fax time and date to our office.

## 2012-06-26 NOTE — Telephone Encounter (Signed)
Left message at the pain clinic for referral coordinator Allie to call regarding the below appt.

## 2012-06-27 NOTE — Telephone Encounter (Signed)
appt on 07/29/12 @ 2:00pm with Dr. Genevive Bi

## 2012-06-27 NOTE — Telephone Encounter (Signed)
Pt informed with this as well.

## 2012-10-03 ENCOUNTER — Encounter (HOSPITAL_BASED_OUTPATIENT_CLINIC_OR_DEPARTMENT_OTHER): Payer: Self-pay | Admitting: *Deleted

## 2012-10-03 ENCOUNTER — Emergency Department (HOSPITAL_BASED_OUTPATIENT_CLINIC_OR_DEPARTMENT_OTHER)
Admission: EM | Admit: 2012-10-03 | Discharge: 2012-10-03 | Disposition: A | Discharge status: Home / Self Care | Payer: PRIVATE HEALTH INSURANCE | Attending: Emergency Medicine | Admitting: Emergency Medicine

## 2012-10-03 DIAGNOSIS — Z87891 Personal history of nicotine dependence: Secondary | ICD-10-CM | POA: Insufficient documentation

## 2012-10-03 DIAGNOSIS — E119 Type 2 diabetes mellitus without complications: Secondary | ICD-10-CM | POA: Insufficient documentation

## 2012-10-03 DIAGNOSIS — Z8742 Personal history of other diseases of the female genital tract: Secondary | ICD-10-CM | POA: Insufficient documentation

## 2012-10-03 DIAGNOSIS — Z3202 Encounter for pregnancy test, result negative: Secondary | ICD-10-CM | POA: Insufficient documentation

## 2012-10-03 DIAGNOSIS — Z8659 Personal history of other mental and behavioral disorders: Secondary | ICD-10-CM | POA: Insufficient documentation

## 2012-10-03 DIAGNOSIS — R209 Unspecified disturbances of skin sensation: Secondary | ICD-10-CM | POA: Insufficient documentation

## 2012-10-03 DIAGNOSIS — M545 Low back pain, unspecified: Secondary | ICD-10-CM | POA: Insufficient documentation

## 2012-10-03 DIAGNOSIS — IMO0002 Reserved for concepts with insufficient information to code with codable children: Secondary | ICD-10-CM | POA: Insufficient documentation

## 2012-10-03 DIAGNOSIS — Z8709 Personal history of other diseases of the respiratory system: Secondary | ICD-10-CM | POA: Insufficient documentation

## 2012-10-03 DIAGNOSIS — Z79899 Other long term (current) drug therapy: Secondary | ICD-10-CM | POA: Insufficient documentation

## 2012-10-03 DIAGNOSIS — I10 Essential (primary) hypertension: Secondary | ICD-10-CM | POA: Insufficient documentation

## 2012-10-03 DIAGNOSIS — M549 Dorsalgia, unspecified: Secondary | ICD-10-CM

## 2012-10-03 DIAGNOSIS — Z87442 Personal history of urinary calculi: Secondary | ICD-10-CM | POA: Insufficient documentation

## 2012-10-03 DIAGNOSIS — Z7982 Long term (current) use of aspirin: Secondary | ICD-10-CM | POA: Insufficient documentation

## 2012-10-03 LAB — URINALYSIS, ROUTINE W REFLEX MICROSCOPIC
Bilirubin Urine: NEGATIVE
Glucose, UA: >1000 mg/dL — AB
Hgb urine dipstick: NEGATIVE
Ketones, ur: NEGATIVE mg/dL
Nitrite: NEGATIVE
Specific Gravity, Urine: 1.024 (ref 1.005–1.030)
pH: 5.5 (ref 5.0–8.0)

## 2012-10-03 LAB — URINE MICROSCOPIC-ADD ON

## 2012-10-03 NOTE — ED Notes (Signed)
C/o lower left back pain since Monday.

## 2012-10-03 NOTE — ED Provider Notes (Signed)
CSN: [id number]     Arrival date & time 10/03/12  0230 History   First MD Initiated Contact with Patient 10/03/12 0239     Chief Complaint  Patient presents with  . Back Pain   Patient is a 42 y.o. female presenting with back pain. The history is provided by the patient.  Back Pain Location:  Lumbar spine Quality:  Aching Radiates to:  Does not radiate Pain severity:  Mild Pain is:  Same all the time Onset quality:  Gradual Duration:  4 days Timing:  Intermittent Progression:  Worsening Chronicity:  New Relieved by:  None tried Worsened by:  Nothing tried Associated symptoms: numbness   Associated symptoms: no abdominal pain, no bladder incontinence, no bowel incontinence, no chest pain, no dysuria, no fever and no weakness   Risk factors: no hx of cancer and no recent surgery   Pt reports a spasm or "kick" in her left lower back.  No trauma/falls No h/o chronic back pain No new abd pain Reports "tingling" in her left thigh but no leg weakness reported  Past Medical History  Diagnosis Date  . Diabetes mellitus   . Hypertension   . Anxiety   . Fibroid tumor   . 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 Ovarian cyst   . Kidney stone   . Seasonal allergies    Past Surgical History  Procedure Laterality Date  . Cholecystectomy    . Appendectomy    . Tonsillectomy    . Stomach and bowel surg     Family History  Problem Relation Age of Onset  . Hypertension Mother   . Thyroid cancer Mother   . Diabetes Maternal Grandmother   . Hypertension Maternal Grandmother   . Breast cancer Maternal Grandmother 28  . Diabetes Maternal Grandfather   . Hypertension Maternal Grandfather    History  Substance Use Topics  . Smoking status: Former Games developer  . Smokeless tobacco: Never Used  . Alcohol Use: Yes     Comment: social   OB History   Grav Para Term Preterm Abortions TAB SAB Ect Mult Living   2 2 2       2      Review of Systems   Constitutional: Negative for fever.  Cardiovascular: Negative for chest pain.  Gastrointestinal: Negative for vomiting, abdominal pain and bowel incontinence.  Genitourinary: Negative for bladder incontinence, dysuria, frequency and vaginal bleeding.  Musculoskeletal: Positive for back pain.  Neurological: Positive for numbness. Negative for weakness.  All other systems reviewed and are negative.    Allergies  Review of patient's allergies indicates no known allergies.  Home Medications   Current Outpatient Rx  Name  Route  Sig  Dispense  Refill  . aspirin 81 MG tablet   Oral   Take 81 mg by mouth daily.         . cetirizine-pseudoephedrine (ZYRTEC-D) 5-120 MG per tablet   Oral   Take 2 tablets by mouth daily as needed. For congestion          . ibuprofen (ADVIL,MOTRIN) 800 MG tablet   Oral   Take 800 mg by mouth every 8 (eight) hours as needed. For pain          . lisinopril-hydrochlorothiazide (PRINZIDE,ZESTORETIC) 20-25 MG per tablet   Oral   Take 1 tablet by mouth daily.           . metFORMIN (GLUCOPHAGE) 1000 MG tablet  Oral   Take 1,000 mg by mouth 2 (two) times daily.           . Multiple Vitamin (MULTIVITAMIN) tablet   Oral   Take 1 tablet by mouth daily. CENTRUM          . EXPIRED: fluticasone (FLONASE) 50 MCG/ACT nasal spray   Nasal   Place 2 sprays into the nose daily.   16 g   0   . HYDROmorphone (DILAUDID) 2 MG tablet   Oral   Take 1 tablet (2 mg total) by mouth every 4 (four) hours as needed for pain.   20 tablet   0   . ondansetron (ZOFRAN ODT) 4 MG disintegrating tablet   Oral   Take 1 tablet (4 mg total) by mouth every 8 (eight) hours as needed for nausea.   10 tablet   0    BP 131/90  Pulse 87  Temp(Src) 97.8 F (36.6 C)  Resp 16  Ht 5\' 6"  (1.676 m)  Wt 235 lb (106.595 kg)  BMI 37.95 kg/m2  SpO2 97%  LMP 09/18/2012 Physical Exam CONSTITUTIONAL: Well developed/well nourished, pt reading magazine when I enter the  room HEAD: Normocephalic/atraumatic EYES: EOMI/PERRL ENMT: Mucous membranes moist NECK: supple no meningeal signs SPINE:entire spine nontender lumbar paraspinal tenderness noted No bruising/crepitance/stepoffs noted to spine CV: S1/S2 noted, no murmurs/rubs/gallops noted LUNGS: Lungs are clear to auscultation bilaterally, no apparent distress ABDOMEN: soft, nontender, no rebound or guarding GU:no cva tenderness NEURO: Awake/alert, equal distal motor: hip flexion/knee flexion/extension, ankle dorsi/plantar flexion, great toe extension intact bilaterally, no clonus bilaterally,  no apparent sensory deficit in any dermatome.  Equal patellar/achilles reflex noted.  Pt is able to ambulate. EXTREMITIES: pulses normal, full ROM SKIN: warm, color normal PSYCH: no abnormalities of mood noted   ED Course  Procedures (including critical care time) Labs Review Labs Reviewed  URINALYSIS, ROUTINE W REFLEX MICROSCOPIC  PREGNANCY, URINE  pt well appearing. Advised use of NSAIDs, and we discussed strict return precautions Advised f/u with PCP for further management   MDM  No diagnosis found. Nursing notes including past medical history and social history reviewed and considered in documentation Labs/vital reviewed and considered     Joya Gaskins, MD 10/03/12 [phone]

## 2012-10-29 ENCOUNTER — Other Ambulatory Visit: Payer: Self-pay | Admitting: *Deleted

## 2012-10-29 DIAGNOSIS — D649 Anemia, unspecified: Secondary | ICD-10-CM

## 2012-11-14 ENCOUNTER — Other Ambulatory Visit: Payer: PRIVATE HEALTH INSURANCE

## 2012-11-14 DIAGNOSIS — D649 Anemia, unspecified: Secondary | ICD-10-CM

## 2012-11-14 LAB — CBC WITH DIFFERENTIAL/PLATELET
Basophils Absolute: 0 10*3/uL (ref 0.0–0.1)
Eosinophils Relative: 3 % (ref 0–5)
Lymphocytes Relative: 35 % (ref 12–46)
MCV: 81.6 fL (ref 78.0–100.0)
Neutrophils Relative %: 55 % (ref 43–77)
Platelets: 380 10*3/uL (ref 150–400)
RBC: 4.23 MIL/uL (ref 3.87–5.11)
RDW: 15 % (ref 11.5–15.5)
WBC: 8.1 10*3/uL (ref 4.0–10.5)

## 2012-11-17 ENCOUNTER — Other Ambulatory Visit: Payer: Self-pay | Admitting: Gynecology

## 2012-11-17 DIAGNOSIS — D649 Anemia, unspecified: Secondary | ICD-10-CM

## 2012-11-21 ENCOUNTER — Other Ambulatory Visit: Payer: PRIVATE HEALTH INSURANCE

## 2012-11-21 DIAGNOSIS — D649 Anemia, unspecified: Secondary | ICD-10-CM

## 2012-11-21 LAB — VITAMIN B12: Vitamin B-12: 593 pg/mL (ref 211–911)

## 2012-11-21 LAB — IRON AND TIBC: Iron: 37 ug/dL — ABNORMAL LOW (ref 42–145)

## 2012-11-21 LAB — FERRITIN: Ferritin: 29 ng/mL (ref 10–291)

## 2013-02-27 ENCOUNTER — Encounter (HOSPITAL_BASED_OUTPATIENT_CLINIC_OR_DEPARTMENT_OTHER): Payer: Self-pay | Admitting: Emergency Medicine

## 2013-02-27 ENCOUNTER — Emergency Department (HOSPITAL_BASED_OUTPATIENT_CLINIC_OR_DEPARTMENT_OTHER)
Admission: EM | Admit: 2013-02-27 | Discharge: 2013-02-27 | Disposition: A | Discharge status: Home / Self Care | Payer: PRIVATE HEALTH INSURANCE | Attending: Emergency Medicine | Admitting: Emergency Medicine

## 2013-02-27 ENCOUNTER — Emergency Department (HOSPITAL_BASED_OUTPATIENT_CLINIC_OR_DEPARTMENT_OTHER): Payer: PRIVATE HEALTH INSURANCE

## 2013-02-27 DIAGNOSIS — Z8709 Personal history of other diseases of the respiratory system: Secondary | ICD-10-CM | POA: Insufficient documentation

## 2013-02-27 DIAGNOSIS — R109 Unspecified abdominal pain: Secondary | ICD-10-CM | POA: Insufficient documentation

## 2013-02-27 DIAGNOSIS — E119 Type 2 diabetes mellitus without complications: Secondary | ICD-10-CM | POA: Insufficient documentation

## 2013-02-27 DIAGNOSIS — Z8659 Personal history of other mental and behavioral disorders: Secondary | ICD-10-CM | POA: Insufficient documentation

## 2013-02-27 DIAGNOSIS — Z7982 Long term (current) use of aspirin: Secondary | ICD-10-CM | POA: Insufficient documentation

## 2013-02-27 DIAGNOSIS — R197 Diarrhea, unspecified: Secondary | ICD-10-CM | POA: Insufficient documentation

## 2013-02-27 DIAGNOSIS — M545 Low back pain, unspecified: Secondary | ICD-10-CM | POA: Insufficient documentation

## 2013-02-27 DIAGNOSIS — Z87891 Personal history of nicotine dependence: Secondary | ICD-10-CM | POA: Insufficient documentation

## 2013-02-27 DIAGNOSIS — I10 Essential (primary) hypertension: Secondary | ICD-10-CM | POA: Insufficient documentation

## 2013-02-27 DIAGNOSIS — Z3202 Encounter for pregnancy test, result negative: Secondary | ICD-10-CM | POA: Insufficient documentation

## 2013-02-27 DIAGNOSIS — Z79899 Other long term (current) drug therapy: Secondary | ICD-10-CM | POA: Insufficient documentation

## 2013-02-27 DIAGNOSIS — Z8742 Personal history of other diseases of the female genital tract: Secondary | ICD-10-CM | POA: Insufficient documentation

## 2013-02-27 DIAGNOSIS — M549 Dorsalgia, unspecified: Secondary | ICD-10-CM

## 2013-02-27 DIAGNOSIS — Z87442 Personal history of urinary calculi: Secondary | ICD-10-CM | POA: Insufficient documentation

## 2013-02-27 LAB — BASIC METABOLIC PANEL
BUN: 20 mg/dL (ref 6–23)
CALCIUM: 9.1 mg/dL (ref 8.4–10.5)
CO2: 24 mEq/L (ref 19–32)
CREATININE: 0.7 mg/dL (ref 0.50–1.10)
Chloride: 95 mEq/L — ABNORMAL LOW (ref 96–112)
Glucose, Bld: 325 mg/dL — ABNORMAL HIGH (ref 70–99)
Potassium: 3.5 mEq/L — ABNORMAL LOW (ref 3.7–5.3)
Sodium: 136 mEq/L — ABNORMAL LOW (ref 137–147)

## 2013-02-27 LAB — CBC WITH DIFFERENTIAL/PLATELET
BASOS ABS: 0 10*3/uL (ref 0.0–0.1)
BASOS PCT: 0 % (ref 0–1)
EOS ABS: 0.3 10*3/uL (ref 0.0–0.7)
EOS PCT: 3 % (ref 0–5)
HEMATOCRIT: 35 % — AB (ref 36.0–46.0)
HEMOGLOBIN: 11.8 g/dL — AB (ref 12.0–15.0)
Lymphocytes Relative: 30 % (ref 12–46)
Lymphs Abs: 2.9 10*3/uL (ref 0.7–4.0)
MCH: 28 pg (ref 26.0–34.0)
MCHC: 33.7 g/dL (ref 30.0–36.0)
MCV: 83.1 fL (ref 78.0–100.0)
MONO ABS: 0.7 10*3/uL (ref 0.1–1.0)
MONOS PCT: 8 % (ref 3–12)
NEUTROS ABS: 5.8 10*3/uL (ref 1.7–7.7)
Neutrophils Relative %: 59 % (ref 43–77)
Platelets: 355 10*3/uL (ref 150–400)
RBC: 4.21 MIL/uL (ref 3.87–5.11)
RDW: 12.6 % (ref 11.5–15.5)
WBC: 9.8 10*3/uL (ref 4.0–10.5)

## 2013-02-27 LAB — PREGNANCY, URINE: PREG TEST UR: NEGATIVE

## 2013-02-27 LAB — URINALYSIS, ROUTINE W REFLEX MICROSCOPIC
Bilirubin Urine: NEGATIVE
Glucose, UA: 250 mg/dL — AB
HGB URINE DIPSTICK: NEGATIVE
KETONES UR: 15 mg/dL — AB
Leukocytes, UA: NEGATIVE
Nitrite: NEGATIVE
PROTEIN: NEGATIVE mg/dL
Specific Gravity, Urine: 1.027 (ref 1.005–1.030)
UROBILINOGEN UA: 0.2 mg/dL (ref 0.0–1.0)
pH: 5.5 (ref 5.0–8.0)

## 2013-02-27 MED ORDER — MELOXICAM 7.5 MG PO TABS: 7.5000 mg | ORAL_TABLET | Freq: Every day | ORAL | Status: DC

## 2013-02-27 MED ORDER — OXYCODONE-ACETAMINOPHEN 5-325 MG PO TABS: 1.0000 | ORAL_TABLET | Freq: Four times a day (QID) | ORAL | Status: DC | PRN

## 2013-02-27 MED ORDER — OXYCODONE-ACETAMINOPHEN 5-325 MG PO TABS
2.0000 | ORAL_TABLET | Freq: Once | ORAL | Status: AC
  Administered 2013-02-27: 2 via ORAL
  Filled 2013-02-27: qty 2

## 2013-02-27 MED ORDER — METHOCARBAMOL 500 MG PO TABS: 500.0000 mg | ORAL_TABLET | Freq: Two times a day (BID) | ORAL | Status: DC

## 2013-02-27 NOTE — ED Provider Notes (Signed)
CSN: [id number]     Arrival date & time 02/27/13  0031 History   First MD Initiated Contact with Patient 02/27/13 0041     Chief Complaint  Patient presents with  . Hip Pain   (Consider location/radiation/quality/duration/timing/severity/associated sxs/prior Treatment) Patient is a 43 y.o. female presenting with flank pain. The history is provided by the patient. No language interpreter was used.  Flank Pain This is a new problem. The current episode started more than 1 week ago. The problem occurs constantly. The problem has not changed since onset.Pertinent negatives include no chest pain, no abdominal pain, no headaches and no shortness of breath. Nothing aggravates the symptoms. Nothing relieves the symptoms. The treatment provided no relief.    Past Medical History  Diagnosis Date  . Diabetes mellitus   . Hypertension   . Anxiety   . Fibroid tumor   . 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 Ovarian cyst   . Kidney stone   . Seasonal allergies    Past Surgical History  Procedure Laterality Date  . Cholecystectomy    . Appendectomy    . Tonsillectomy    . Stomach and bowel surg     Family History  Problem Relation Age of Onset  . Hypertension Mother   . Thyroid cancer Mother   . Diabetes Maternal Grandmother   . Hypertension Maternal Grandmother   . Breast cancer Maternal Grandmother 78  . Diabetes Maternal Grandfather   . Hypertension Maternal Grandfather    History  Substance Use Topics  . Smoking status: Former Research scientist (life sciences)  . Smokeless tobacco: Never Used  . Alcohol Use: Yes     Comment: social   OB History   Grav Para Term Preterm Abortions TAB SAB Ect Mult Living   2 2 2       2      Review of Systems  Respiratory: Negative for shortness of breath.   Cardiovascular: Negative for chest pain.  Gastrointestinal: Positive for diarrhea. Negative for abdominal pain.  Genitourinary: Positive for flank pain.  Neurological: Negative for  headaches.  All other systems reviewed and are negative.    Allergies  Review of patient's allergies indicates no known allergies.  Home Medications   Current Outpatient Rx  Name  Route  Sig  Dispense  Refill  . aspirin 81 MG tablet   Oral   Take 81 mg by mouth daily.         Marland Kitchen EXPIRED: fluticasone (FLONASE) 50 MCG/ACT nasal spray   Nasal   Place 2 sprays into the nose daily.   16 g   0   . lisinopril-hydrochlorothiazide (PRINZIDE,ZESTORETIC) 20-25 MG per tablet   Oral   Take 1 tablet by mouth daily.           . metFORMIN (GLUCOPHAGE) 1000 MG tablet   Oral   Take 1,000 mg by mouth 2 (two) times daily.           . Multiple Vitamin (MULTIVITAMIN) tablet   Oral   Take 1 tablet by mouth daily. CENTRUM           BP 122/90  Pulse 105  Temp(Src) 98 F (36.7 C) (Oral)  Resp 14  Ht 5\' 6"  (1.676 m)  Wt 225 lb (102.059 kg)  BMI 36.33 kg/m2  SpO2 100%  LMP 01/24/2013 Physical Exam  Constitutional: She is oriented to person, place, and time. She appears well-developed and well-nourished.  HENT:  Head:  Normocephalic and atraumatic.  Mouth/Throat: Oropharynx is clear and moist.  Eyes: Conjunctivae are normal. Pupils are equal, round, and reactive to light.  Neck: Normal range of motion. Neck supple.  Cardiovascular: Normal rate, regular rhythm and intact distal pulses.   Pulmonary/Chest: Effort normal and breath sounds normal. She has no wheezes. She has no rales.  Abdominal: Soft. Bowel sounds are normal. There is no tenderness. There is no rebound and no guarding.  Musculoskeletal: Normal range of motion.  Neurological: She is alert and oriented to person, place, and time.  Skin: Skin is warm and dry.  Psychiatric: She has a normal mood and affect.    ED Course  Procedures (including critical care time) Labs Review Labs Reviewed  URINALYSIS, ROUTINE W REFLEX MICROSCOPIC - Abnormal; Notable for the following:    APPearance CLOUDY (*)    Glucose, UA 250 (*)     Ketones, ur 15 (*)    All other components within normal limits  CBC WITH DIFFERENTIAL - Abnormal; Notable for the following:    Hemoglobin 11.8 (*)    HCT 35.0 (*)    All other components within normal limits  PREGNANCY, URINE  BASIC METABOLIC PANEL   Imaging Review No results found.  EKG Interpretation   None       MDM  No diagnosis found. Suspect this is a pulled muscle as no source is identified.  Patient is instructed to take her metformin and not to eat carb laden foods and call her family doctor in the am for follow up.  She should check her sugars 4 times daily.  Patient verbalizes understanding and agrees to follow up    Sarkis Rhines Alfonso Patten, MD 02/27/13 [phone]

## 2013-02-27 NOTE — ED Notes (Signed)
Right hip and right low back pain x1 week without injury. Also diarrhea x4 days.

## 2013-04-07 ENCOUNTER — Telehealth: Payer: Self-pay | Admitting: *Deleted

## 2013-04-07 NOTE — Telephone Encounter (Signed)
Pt called c/o issues while on cycle heavy bleeding with clots, headaches, feeling tired. I called patient and left on voicemail OV best last saw TF in May 2014.

## 2013-04-30 ENCOUNTER — Ambulatory Visit (INDEPENDENT_AMBULATORY_CARE_PROVIDER_SITE_OTHER): Payer: PRIVATE HEALTH INSURANCE | Admitting: Gynecology

## 2013-04-30 ENCOUNTER — Encounter: Payer: Self-pay | Admitting: Gynecology

## 2013-04-30 DIAGNOSIS — R1031 Right lower quadrant pain: Secondary | ICD-10-CM

## 2013-04-30 DIAGNOSIS — M549 Dorsalgia, unspecified: Secondary | ICD-10-CM

## 2013-04-30 LAB — URINALYSIS W MICROSCOPIC + REFLEX CULTURE
Bilirubin Urine: NEGATIVE
Glucose, UA: >1000 mg/dL — AB
HGB URINE DIPSTICK: NEGATIVE
KETONES UR: NEGATIVE mg/dL
LEUKOCYTES UA: NEGATIVE
NITRITE: NEGATIVE
PH: 6 (ref 5.0–8.0)
PROTEIN: NEGATIVE mg/dL
Specific Gravity, Urine: 1.015 (ref 1.005–1.030)
Urobilinogen, UA: 0.2 mg/dL (ref 0.0–1.0)

## 2013-04-30 MED ORDER — HYDROCODONE-ACETAMINOPHEN 5-325 MG PO TABS: 1.0000 | ORAL_TABLET | Freq: Four times a day (QID) | ORAL | Status: DC | PRN

## 2013-04-30 NOTE — Patient Instructions (Signed)
Follow up for ultrasound as scheduled 

## 2013-04-30 NOTE — Progress Notes (Signed)
Holly Wells 01/21/1971 [id number]        43 y.o.  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 She's had several year history of a persistent right adnexal/ovarian cyst in the 3-4 cm range which has remained stable and chronic right lower quadrant pain. She was evaluated at [facility] by Dr. Burke Keels where surgery was not recommended due to to her history of the abscess for fear of significant adhesive disease and was treated with intermittent pain medication.  She has done well over the past year with minimal pain but now notes an episode starting last week. She did have some narcotic pain medicine left over from her emergency room evaluation but has used this. No nausea vomiting diarrhea constipation or urinary symptoms such as frequency dysuria or urgency. He also sustained pain and she has had the past. We discussed various options as noted in my 06/05/2012 note. She originally was going to see a gynecologic oncologist who ultimately recommended referral to [facility] Dr. Alexis Goodell, M.D. [phone]). Patient had not been seen there yet because she had done well over the past year.    Past medical history,surgical history, problem list, medications, allergies, family history and social history were all reviewed and documented in the EPIC chart.  Exam: Kim assistant General appearance  Normal Abdomen soft minimal tenderness active bowel sounds no masses guarding rebound organomegaly. Pelvic external BUS vagina normal. Cervix normal. Uterus grossly normal midline mobile difficult to palpate due to abdominal girth. Adnexa without masses right lower quadrant mildly tender. Rectovaginal normal.  Assessment/Plan:  43 y.o.  G2P2002 chronic right lower quadrant pain, intermittent.  Has done well this past year. Lortab 5.0/3.25 #20 when necessary pain now and check baseline ultrasound. Patient has annual exam scheduled in a month and will follow up for this. If pain continues then refer to Dr. Peterson Ao as noted above.   Note: This document was prepared with digital dictation and possible smart phrase technology. Any transcriptional errors that result from this process are unintentional.   Anastasio Auerbach MD, 3:07 PM 04/30/2013

## 2013-05-07 ENCOUNTER — Other Ambulatory Visit: Payer: Self-pay | Admitting: Gynecology

## 2013-05-07 ENCOUNTER — Ambulatory Visit (INDEPENDENT_AMBULATORY_CARE_PROVIDER_SITE_OTHER): Payer: PRIVATE HEALTH INSURANCE | Admitting: Gynecology

## 2013-05-07 ENCOUNTER — Ambulatory Visit (INDEPENDENT_AMBULATORY_CARE_PROVIDER_SITE_OTHER): Payer: PRIVATE HEALTH INSURANCE

## 2013-05-07 ENCOUNTER — Encounter: Payer: Self-pay | Admitting: Gynecology

## 2013-05-07 DIAGNOSIS — N949 Unspecified condition associated with female genital organs and menstrual cycle: Secondary | ICD-10-CM

## 2013-05-07 DIAGNOSIS — N83209 Unspecified ovarian cyst, unspecified side: Secondary | ICD-10-CM

## 2013-05-07 DIAGNOSIS — R102 Pelvic and perineal pain: Secondary | ICD-10-CM

## 2013-05-07 DIAGNOSIS — N831 Corpus luteum cyst of ovary, unspecified side: Secondary | ICD-10-CM

## 2013-05-07 DIAGNOSIS — R1031 Right lower quadrant pain: Secondary | ICD-10-CM

## 2013-05-07 DIAGNOSIS — N839 Noninflammatory disorder of ovary, fallopian tube and broad ligament, unspecified: Secondary | ICD-10-CM

## 2013-05-07 NOTE — Progress Notes (Signed)
Miguel Medal 1970/04/12 [id number]        42 y.o.  [id number] for ultrasound. History of recent exacerbated pelvic pain and right ovarian/adnexal cystic mass. Note 04/30/2013 reviewed her history. Patient reports her pain is better now.  Past medical history,surgical history, problem list, medications, allergies, family history and social history were all reviewed and documented in the EPIC chart.  Ultrasound shows uterus grossly normal. Endometrial echo 3.8 mm. Left ovary normal. Right ovary with a vascular echo-free cystic mass 49 mm mean. Cul-de-sac negative.  Assessment/Plan:  43 y.o. [id number] with persistent right adnexal cystic mass. Pain is better now. History of significant abdominal adhesive disease. Review of chart shows cyst measuring 46 mm 2012, 46 mm 2013 and 41 mm 2014. I think this all has remained stable over years followup. She is comfortable with expectant management at this time. She'll followup when she is due for her annual exam the next month or 2.   Note: This document was prepared with digital dictation and possible smart phrase technology. Any transcriptional errors that result from this process are unintentional.   Anastasio Auerbach MD, 3:46 PM 05/07/2013

## 2013-05-07 NOTE — Patient Instructions (Signed)
Follow up for your annual exam as scheduled. 

## 2013-06-02 ENCOUNTER — Ambulatory Visit (INDEPENDENT_AMBULATORY_CARE_PROVIDER_SITE_OTHER): Payer: PRIVATE HEALTH INSURANCE | Admitting: Gynecology

## 2013-06-02 ENCOUNTER — Other Ambulatory Visit (HOSPITAL_COMMUNITY)
Admission: RE | Admit: 2013-06-02 | Discharge: 2013-06-02 | Disposition: A | Discharge status: Home / Self Care | Payer: PRIVATE HEALTH INSURANCE | Source: Ambulatory Visit | Attending: Gynecology | Admitting: Gynecology

## 2013-06-02 ENCOUNTER — Encounter: Payer: Self-pay | Admitting: Gynecology

## 2013-06-02 ENCOUNTER — Emergency Department (HOSPITAL_BASED_OUTPATIENT_CLINIC_OR_DEPARTMENT_OTHER)
Admission: EM | Admit: 2013-06-02 | Discharge: 2013-06-02 | Disposition: A | Discharge status: Home / Self Care | Payer: PRIVATE HEALTH INSURANCE | Attending: Emergency Medicine | Admitting: Emergency Medicine

## 2013-06-02 ENCOUNTER — Encounter (HOSPITAL_BASED_OUTPATIENT_CLINIC_OR_DEPARTMENT_OTHER): Payer: Self-pay | Admitting: Emergency Medicine

## 2013-06-02 VITALS — BP 120/78 | Ht 65.0 in | Wt 228.0 lb

## 2013-06-02 DIAGNOSIS — L0291 Cutaneous abscess, unspecified: Secondary | ICD-10-CM

## 2013-06-02 DIAGNOSIS — E119 Type 2 diabetes mellitus without complications: Secondary | ICD-10-CM | POA: Insufficient documentation

## 2013-06-02 DIAGNOSIS — R102 Pelvic and perineal pain unspecified side: Secondary | ICD-10-CM

## 2013-06-02 DIAGNOSIS — R5383 Other fatigue: Secondary | ICD-10-CM | POA: Insufficient documentation

## 2013-06-02 DIAGNOSIS — Z79899 Other long term (current) drug therapy: Secondary | ICD-10-CM | POA: Insufficient documentation

## 2013-06-02 DIAGNOSIS — Z01419 Encounter for gynecological examination (general) (routine) without abnormal findings: Secondary | ICD-10-CM | POA: Insufficient documentation

## 2013-06-02 DIAGNOSIS — Z1151 Encounter for screening for human papillomavirus (HPV): Secondary | ICD-10-CM | POA: Insufficient documentation

## 2013-06-02 DIAGNOSIS — Z87442 Personal history of urinary calculi: Secondary | ICD-10-CM | POA: Insufficient documentation

## 2013-06-02 DIAGNOSIS — R51 Headache: Secondary | ICD-10-CM | POA: Insufficient documentation

## 2013-06-02 DIAGNOSIS — F411 Generalized anxiety disorder: Secondary | ICD-10-CM | POA: Insufficient documentation

## 2013-06-02 DIAGNOSIS — I10 Essential (primary) hypertension: Secondary | ICD-10-CM | POA: Insufficient documentation

## 2013-06-02 DIAGNOSIS — R5381 Other malaise: Secondary | ICD-10-CM | POA: Insufficient documentation

## 2013-06-02 DIAGNOSIS — L0390 Cellulitis, unspecified: Secondary | ICD-10-CM

## 2013-06-02 DIAGNOSIS — N949 Unspecified condition associated with female genital organs and menstrual cycle: Secondary | ICD-10-CM

## 2013-06-02 DIAGNOSIS — IMO0002 Reserved for concepts with insufficient information to code with codable children: Secondary | ICD-10-CM | POA: Insufficient documentation

## 2013-06-02 DIAGNOSIS — Z87891 Personal history of nicotine dependence: Secondary | ICD-10-CM | POA: Insufficient documentation

## 2013-06-02 DIAGNOSIS — N83209 Unspecified ovarian cyst, unspecified side: Secondary | ICD-10-CM

## 2013-06-02 DIAGNOSIS — Z8742 Personal history of other diseases of the female genital tract: Secondary | ICD-10-CM | POA: Insufficient documentation

## 2013-06-02 MED ORDER — LIDOCAINE VISCOUS 2 % MT SOLN
15.0000 mL | Freq: Once | OROMUCOSAL | Status: AC
  Administered 2013-06-02: 15 mL via OROMUCOSAL

## 2013-06-02 MED ORDER — OXYCODONE-ACETAMINOPHEN 5-325 MG PO TABS
2.0000 | ORAL_TABLET | Freq: Once | ORAL | Status: AC
  Administered 2013-06-02: 2 via ORAL
  Filled 2013-06-02: qty 2

## 2013-06-02 MED ORDER — CEPHALEXIN 250 MG PO CAPS
500.0000 mg | ORAL_CAPSULE | Freq: Once | ORAL | Status: AC
  Administered 2013-06-02: 500 mg via ORAL
  Filled 2013-06-02: qty 2

## 2013-06-02 MED ORDER — LIDOCAINE HCL 2 % IJ SOLN
INTRAMUSCULAR | Status: AC
  Filled 2013-06-02: qty 20

## 2013-06-02 MED ORDER — DOXYCYCLINE HYCLATE 100 MG PO CAPS: 100.0000 mg | ORAL_CAPSULE | Freq: Two times a day (BID) | ORAL | Status: DC

## 2013-06-02 MED ORDER — CEPHALEXIN 500 MG PO CAPS: 500.0000 mg | ORAL_CAPSULE | Freq: Four times a day (QID) | ORAL | Status: DC

## 2013-06-02 MED ORDER — DOXYCYCLINE HYCLATE 100 MG PO TABS
100.0000 mg | ORAL_TABLET | Freq: Once | ORAL | Status: AC
  Administered 2013-06-02: 100 mg via ORAL
  Filled 2013-06-02: qty 1

## 2013-06-02 MED ORDER — TRAMADOL HCL 50 MG PO TABS: 50.0000 mg | ORAL_TABLET | Freq: Four times a day (QID) | ORAL | Status: DC | PRN

## 2013-06-02 MED ORDER — MELOXICAM 7.5 MG PO TABS: 7.5000 mg | ORAL_TABLET | Freq: Every day | ORAL | Status: DC

## 2013-06-02 NOTE — ED Provider Notes (Signed)
CSN: [id number]     Arrival date & time 06/02/13  0009 History  This chart was scribed for Holly Wells Alfonso Patten, MD by Celesta Gentile, ED Scribe. The patient was seen in room MH02/MH02. Patient's care was started at 12:34 AM.  Chief Complaint  Patient presents with  . Fever   Patient is a 43 y.o. female presenting with fever. The history is provided by the patient. No language interpreter was used.  Fever Temp source:  Subjective Severity:  Moderate Onset quality:  Sudden Duration:  6 hours Timing:  Intermittent Progression:  Unchanged Chronicity:  New Relieved by:  Nothing Worsened by:  Nothing tried Ineffective treatments:  None tried Associated symptoms: chills and headaches   Associated symptoms: no congestion, no diarrhea and no nausea   Risk factors: no hx of cancer    HPI Comments: Holly Wells is a 43 y.o. female who presents to the Emergency Department complaining of a subjective fever with associated chills that started this afternoon.  Pt states she was experiencing generalized weakness.  Pt has an abscess to her left axilla that onset about 2 days ago.  She states she tried home remedies without relief.  Pt denies taking anything for pain at home.  Pt states she thinks her tetanus vaccine was last than ten years.    Past Medical History  Diagnosis Date  . Diabetes mellitus   . Hypertension   . Anxiety   . Fibroid tumor   . 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 Ovarian cyst   . Kidney stone   . Seasonal allergies    Past Surgical History  Procedure Laterality Date  . Cholecystectomy    . Appendectomy    . Tonsillectomy    . Stomach and bowel surg     Family History  Problem Relation Age of Onset  . Hypertension Mother   . Thyroid cancer Mother   . Diabetes Maternal Grandmother   . Hypertension Maternal Grandmother   . Breast cancer Maternal Grandmother 78  . Diabetes Maternal Grandfather   . Hypertension Maternal Grandfather     History  Substance Use Topics  . Smoking status: Former Research scientist (life sciences)  . Smokeless tobacco: Never Used  . Alcohol Use: Yes     Comment: social   OB History   Grav Para Term Preterm Abortions TAB SAB Ect Mult Living   2 2 2       2      Review of Systems  Constitutional: Positive for fever and chills.  HENT: Negative for congestion.   Gastrointestinal: Negative for nausea, abdominal pain and diarrhea.  Skin: Negative for color change.       Abscess left axilla.   Neurological: Positive for headaches.  Psychiatric/Behavioral: Negative for behavioral problems.  All other systems reviewed and are negative.   Allergies  Review of patient's allergies indicates no known allergies.  Home Medications   Prior to Admission medications   Medication Sig Start Date End Date Taking? Authorizing Provider  fluticasone (FLONASE) 50 MCG/ACT nasal spray Place 2 sprays into the nose daily. 08/13/11 08/12/12  Sheliah Mends, PA-C  HYDROcodone-acetaminophen (LORTAB) 5-325 MG per tablet Take 1 tablet by mouth every 6 (six) hours as needed for moderate pain. 04/30/13   Anastasio Auerbach, MD  lisinopril-hydrochlorothiazide (PRINZIDE,ZESTORETIC) 20-25 MG per tablet Take 1 tablet by mouth daily.      Historical Provider, MD  metFORMIN (GLUCOPHAGE) 1000 MG tablet Take 1,000 mg by  mouth 2 (two) times daily.      Historical Provider, MD  MORPHINE SULFATE PO Take by mouth.    Historical Provider, MD  Multiple Vitamin (MULTIVITAMIN) tablet Take 1 tablet by mouth daily. CENTRUM     Historical Provider, MD   Triage Vitals: BP 125/76  Pulse 112  Temp(Src) 99.4 F (37.4 C) (Oral)  Resp 18  Ht 5\' 5"  (1.651 m)  Wt 225 lb (102.059 kg)  BMI 37.44 kg/m2  SpO2 99%  LMP 04/24/2013  Physical Exam  Nursing note and vitals reviewed. Constitutional: She is oriented to person, place, and time. She appears well-developed and well-nourished. No distress.  HENT:  Head: Normocephalic and atraumatic.  Eyes: Conjunctivae and  EOM are normal. Pupils are equal, round, and reactive to light. Right eye exhibits no discharge. Left eye exhibits no discharge.  Neck: Neck supple. No tracheal deviation present.  Cardiovascular: Normal rate and normal heart sounds.  Exam reveals no gallop and no friction rub.   No murmur heard. Pulmonary/Chest: Effort normal and breath sounds normal. No respiratory distress. She has no wheezes. She has no rales.  Abdominal: Soft. Bowel sounds are normal. She exhibits no distension. There is no tenderness. There is no rebound.  Musculoskeletal: Normal range of motion.  Neurological: She is alert and oriented to person, place, and time.  Skin: Skin is warm and dry. No rash noted.  1 in ovoid abscess to left axilla.  Overlying redness with induration and fluctuance.    Psychiatric: She has a normal mood and affect. Her behavior is normal.    ED Course  Procedures (including critical care time) DIAGNOSTIC STUDIES: Oxygen Saturation is 99% on RA, normal by my interpretation.    COORDINATION OF CARE: 12:40 AM-Will attempt to drain the area.  Patient informed of current plan of treatment and evaluation and agrees with plan.    Labs Review Labs Reviewed - No data to display  Imaging Review No results found.   EKG Interpretation None      MDM   Final diagnoses:  None  INCISION AND DRAINAGE Performed by: Gurbani Figge K Fraya Ueda-Rasch Consent: Verbal consent obtained. Risks and benefits: risks, benefits and alternatives were discussed Type: abscess  Body area: left axilla  Anesthesia: local infiltration  Incision was made with a scalpel.  Local anesthetic: lidocaine 2%   Anesthetic total: 30ml  Complexity: complex Blunt dissection to break up loculations  Drainage: purulent  Drainage amount: moderate   Patient tolerance: Patient tolerated the procedure well with no immediate complications.   Placed on abx due to overlying cellulitis   I personally performed the  services described in this documentation, which was scribed in my presence. The recorded information has been reviewed and is accurate.      Carlisle Beers, MD 06/02/13 [phone]

## 2013-06-02 NOTE — ED Notes (Signed)
Pt. Had I&D done and tolerated well.

## 2013-06-02 NOTE — Progress Notes (Signed)
Holly Wells 09-16-1970 [id number]        42 y.o.  G2P2002 for annual exam.  Several issues noted below.  Past medical history,surgical history, problem list, medications, allergies, family history and social history were all reviewed and documented as reviewed in the EPIC chart.  ROS:  12 system ROS performed with pertinent positives and negatives included in the history, assessment and plan.  Included Systems: General, HEENT, Neck, Cardiovascular, Pulmonary, Gastrointestinal, Genitourinary, Musculoskeletal, Dermatologic, Endocrine, Hematological, Neurologic, Psychiatric Additional significant findings : Related to the recent drainage of the left axillary boil   Exam: Kim assistant Filed Vitals:   06/02/13 1003  BP: 120/78  Height: 5\' 5"  (1.651 m)  Weight: 228 lb (103.42 kg)   General appearance:  Normal affect, orientation and appearance. Skin: Grossly normal HEENT: Without gross lesions.  No cervical or supraclavicular adenopathy. Thyroid normal.  Lungs:  Clear without wheezing, rales or rhonchi Cardiac: RR, without RMG Abdominal:  Soft, nontender, without masses, guarding, rebound, organomegaly or hernia Breasts:  Examined lying and sitting without masses, retractions, discharge or axillary adenopathy. Bandage in place left axillary region. Pelvic:  Ext/BUS/vagina normal  Cervix normal  Uterus grossly normal without tenderness.   Adnexa  Without gross masses or tenderness    Anus and perineum  Normal   Rectovaginal  Normal sphincter tone without palpated masses or tenderness.    Assessment/Plan:  43 y.o. G6P2002 female for annual exam regular menses, no contraception.   1. At axillary boil drained last night at the emergency room. Currently on antibiotics. Patient will followup if any issues. 2. 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 Recent CT and ultrasound shows stability in size at 40-50 mm. Had recent exacerbation in pain but this is resolved  and she is doing well. Will plan continued expectant management at this point. 3. Contraception. Patient again not using contraception. HSG in the past showed blocked right tube and patent left tube. The issues of pregnancy and risks to include age related/chromosomal and her underlying medical issues of hypertension diabetes and medications reviewed again with her. She understands the risks and declines contraception. She would accept pregnancy occurs. 4. Mammography never. I strongly recommended patient schedule a baseline mammogram and she agrees to arrange. SBE monthly reviewed. 5. Pap smear 05/2012 normal with negative HPV but no endocervical cells. Pap smear/HPV done today. No history of significant abnormal Pap smears previously. 6. Health maintenance. No routine lab work done as patient reports this all done through her primary physician's office. Followup one year, sooner as needed.   Note: This document was prepared with digital dictation and possible smart phrase technology. Any transcriptional errors that result from this process are unintentional.   Anastasio Auerbach MD, 11:35 AM 06/02/2013

## 2013-06-02 NOTE — Patient Instructions (Signed)
Followup in one year, sooner if any issues.    Health Maintenance, Female A healthy lifestyle and preventative care can promote health and wellness.  Maintain regular health, dental, and eye exams.  Eat a healthy diet. Foods like vegetables, fruits, whole grains, low-fat dairy products, and lean protein foods contain the nutrients you need without too many calories. Decrease your intake of foods high in solid fats, added sugars, and salt. Get information about a proper diet from your caregiver, if necessary.  Regular physical exercise is one of the most important things you can do for your health. Most adults should get at least 150 minutes of moderate-intensity exercise (any activity that increases your heart rate and causes you to sweat) each week. In addition, most adults need muscle-strengthening exercises on 2 or more days a week.   Maintain a healthy weight. The body mass index (BMI) is a screening tool to identify possible weight problems. It provides an estimate of body fat based on height and weight. Your caregiver can help determine your BMI, and can help you achieve or maintain a healthy weight. For adults 20 years and older:  A BMI below 18.5 is considered underweight.  A BMI of 18.5 to 24.9 is normal.  A BMI of 25 to 29.9 is considered overweight.  A BMI of 30 and above is considered obese.  Maintain normal blood lipids and cholesterol by exercising and minimizing your intake of saturated fat. Eat a balanced diet with plenty of fruits and vegetables. Blood tests for lipids and cholesterol should begin at age 20 and be repeated every 5 years. If your lipid or cholesterol levels are high, you are over 50, or you are a high risk for heart disease, you may need your cholesterol levels checked more frequently.Ongoing high lipid and cholesterol levels should be treated with medicines if diet and exercise are not effective.  If you smoke, find out from your caregiver how to quit. If  you do not use tobacco, do not start.  Lung cancer screening is recommended for adults aged 55 80 years who are at high risk for developing lung cancer because of a history of smoking. Yearly low-dose computed tomography (CT) is recommended for people who have at least a 30-pack-year history of smoking and are a current smoker or have quit within the past 15 years. A pack year of smoking is smoking an average of 1 pack of cigarettes a day for 1 year (for example: 1 pack a day for 30 years or 2 packs a day for 15 years). Yearly screening should continue until the smoker has stopped smoking for at least 15 years. Yearly screening should also be stopped for people who develop a health problem that would prevent them from having lung cancer treatment.  If you are pregnant, do not drink alcohol. If you are breastfeeding, be very cautious about drinking alcohol. If you are not pregnant and choose to drink alcohol, do not exceed 1 drink per day. One drink is considered to be 12 ounces (355 mL) of beer, 5 ounces (148 mL) of wine, or 1.5 ounces (44 mL) of liquor.  Avoid use of street drugs. Do not share needles with anyone. Ask for help if you need support or instructions about stopping the use of drugs.  High blood pressure causes heart disease and increases the risk of stroke. Blood pressure should be checked at least every 1 to 2 years. Ongoing high blood pressure should be treated with medicines, if weight loss   and exercise are not effective.  If you are 38 to 43 years old, ask your caregiver if you should take aspirin to prevent strokes.  Diabetes screening involves taking a blood sample to check your fasting blood sugar level. This should be done once every 3 years, after age 42, if you are within normal weight and without risk factors for diabetes. Testing should be considered at a younger age or be carried out more frequently if you are overweight and have at least 1 risk factor for diabetes.  Breast  cancer screening is essential preventative care for women. You should practice "breast self-awareness." This means understanding the normal appearance and feel of your breasts and may include breast self-examination. Any changes detected, no matter how small, should be reported to a caregiver. Women in their 54s and 30s should have a clinical breast exam (CBE) by a caregiver as part of a regular health exam every 1 to 3 years. After age 39, women should have a CBE every year. Starting at age 52, women should consider having a mammogram (breast X-ray) every year. Women who have a family history of breast cancer should talk to their caregiver about genetic screening. Women at a high risk of breast cancer should talk to their caregiver about having an MRI and a mammogram every year.  Breast cancer gene (BRCA)-related cancer risk assessment is recommended for women who have family members with BRCA-related cancers. BRCA-related cancers include breast, ovarian, tubal, and peritoneal cancers. Having family members with these cancers may be associated with an increased risk for harmful changes (mutations) in the breast cancer genes BRCA1 and BRCA2. Results of the assessment will determine the need for genetic counseling and BRCA1 and BRCA2 testing.  The Pap test is a screening test for cervical cancer. Women should have a Pap test starting at age 15. Between ages 33 and 83, Pap tests should be repeated every 2 years. Beginning at age 78, you should have a Pap test every 3 years as long as the past 3 Pap tests have been normal. If you had a hysterectomy for a problem that was not cancer or a condition that could lead to cancer, then you no longer need Pap tests. If you are between ages 85 and 26, and you have had normal Pap tests going back 10 years, you no longer need Pap tests. If you have had past treatment for cervical cancer or a condition that could lead to cancer, you need Pap tests and screening for cancer for  at least 20 years after your treatment. If Pap tests have been discontinued, risk factors (such as a new sexual partner) need to be reassessed to determine if screening should be resumed. Some women have medical problems that increase the chance of getting cervical cancer. In these cases, your caregiver may recommend more frequent screening and Pap tests.  The human papillomavirus (HPV) test is an additional test that may be used for cervical cancer screening. The HPV test looks for the virus that can cause the cell changes on the cervix. The cells collected during the Pap test can be tested for HPV. The HPV test could be used to screen women aged 74 years and older, and should be used in women of any age who have unclear Pap test results. After the age of 75, women should have HPV testing at the same frequency as a Pap test.  Colorectal cancer can be detected and often prevented. Most routine colorectal cancer screening begins at the  age of 50 and continues through age 75. However, your caregiver may recommend screening at an earlier age if you have risk factors for colon cancer. On a yearly basis, your caregiver may provide home test kits to check for hidden blood in the stool. Use of a small camera at the end of a tube, to directly examine the colon (sigmoidoscopy or colonoscopy), can detect the earliest forms of colorectal cancer. Talk to your caregiver about this at age 50, when routine screening begins. Direct examination of the colon should be repeated every 5 to 10 years through age 75, unless early forms of pre-cancerous polyps or small growths are found.  Hepatitis C blood testing is recommended for all people born from 1945 through 1965 and any individual with known risks for hepatitis C.  Practice safe sex. Use condoms and avoid high-risk sexual practices to reduce the spread of sexually transmitted infections (STIs). Sexually active women aged 25 and younger should be checked for Chlamydia,  which is a common sexually transmitted infection. Older women with new or multiple partners should also be tested for Chlamydia. Testing for other STIs is recommended if you are sexually active and at increased risk.  Osteoporosis is a disease in which the bones lose minerals and strength with aging. This can result in serious bone fractures. The risk of osteoporosis can be identified using a bone density scan. Women ages 65 and over and women at risk for fractures or osteoporosis should discuss screening with their caregivers. Ask your caregiver whether you should be taking a calcium supplement or vitamin D to reduce the rate of osteoporosis.  Menopause can be associated with physical symptoms and risks. Hormone replacement therapy is available to decrease symptoms and risks. You should talk to your caregiver about whether hormone replacement therapy is right for you.  Use sunscreen. Apply sunscreen liberally and repeatedly throughout the day. You should seek shade when your shadow is shorter than you. Protect yourself by wearing long sleeves, pants, a wide-brimmed hat, and sunglasses year round, whenever you are outdoors.  Notify your caregiver of new moles or changes in moles, especially if there is a change in shape or color. Also notify your caregiver if a mole is larger than the size of a pencil eraser.  Stay current with your immunizations. Document Released: 08/07/2010 Document Revised: 05/19/2012 Document Reviewed: 08/07/2010 [employer] Patient Information 2014 [employer]   

## 2013-06-02 NOTE — ED Notes (Signed)
Onset this afternoon   Chills  fevfer  Does have an abscess left axilla

## 2013-07-19 ENCOUNTER — Emergency Department (HOSPITAL_BASED_OUTPATIENT_CLINIC_OR_DEPARTMENT_OTHER)
Admission: EM | Admit: 2013-07-19 | Discharge: 2013-07-19 | Disposition: A | Discharge status: Home / Self Care | Payer: PRIVATE HEALTH INSURANCE | Attending: Emergency Medicine | Admitting: Emergency Medicine

## 2013-07-19 ENCOUNTER — Encounter (HOSPITAL_BASED_OUTPATIENT_CLINIC_OR_DEPARTMENT_OTHER): Payer: Self-pay | Admitting: Emergency Medicine

## 2013-07-19 DIAGNOSIS — Z791 Long term (current) use of non-steroidal anti-inflammatories (NSAID): Secondary | ICD-10-CM | POA: Insufficient documentation

## 2013-07-19 DIAGNOSIS — Z8742 Personal history of other diseases of the female genital tract: Secondary | ICD-10-CM | POA: Insufficient documentation

## 2013-07-19 DIAGNOSIS — Z87442 Personal history of urinary calculi: Secondary | ICD-10-CM | POA: Insufficient documentation

## 2013-07-19 DIAGNOSIS — IMO0002 Reserved for concepts with insufficient information to code with codable children: Secondary | ICD-10-CM | POA: Insufficient documentation

## 2013-07-19 DIAGNOSIS — K029 Dental caries, unspecified: Secondary | ICD-10-CM | POA: Insufficient documentation

## 2013-07-19 DIAGNOSIS — K047 Periapical abscess without sinus: Secondary | ICD-10-CM | POA: Insufficient documentation

## 2013-07-19 DIAGNOSIS — Z792 Long term (current) use of antibiotics: Secondary | ICD-10-CM | POA: Insufficient documentation

## 2013-07-19 DIAGNOSIS — I10 Essential (primary) hypertension: Secondary | ICD-10-CM | POA: Insufficient documentation

## 2013-07-19 DIAGNOSIS — F411 Generalized anxiety disorder: Secondary | ICD-10-CM | POA: Insufficient documentation

## 2013-07-19 DIAGNOSIS — Z87891 Personal history of nicotine dependence: Secondary | ICD-10-CM | POA: Insufficient documentation

## 2013-07-19 DIAGNOSIS — E119 Type 2 diabetes mellitus without complications: Secondary | ICD-10-CM | POA: Insufficient documentation

## 2013-07-19 DIAGNOSIS — E669 Obesity, unspecified: Secondary | ICD-10-CM | POA: Insufficient documentation

## 2013-07-19 MED ORDER — AMOXICILLIN 500 MG PO CAPS: 500.0000 mg | ORAL_CAPSULE | Freq: Three times a day (TID) | ORAL | Status: DC

## 2013-07-19 MED ORDER — HYDROCODONE-ACETAMINOPHEN 5-325 MG PO TABS: 2.0000 | ORAL_TABLET | ORAL | Status: DC | PRN

## 2013-07-19 NOTE — ED Provider Notes (Signed)
CSN: [id number]     Arrival date & time 07/19/13  36 History   First MD Initiated Contact with Patient 07/19/13 1721     Chief Complaint  Patient presents with  . Dental Pain     (Consider location/radiation/quality/duration/timing/severity/associated sxs/prior Treatment) Patient is a 43 y.o. female presenting with tooth pain. The history is provided by the patient.  Dental Pain Location:  Upper Upper teeth location:  12/LU 1st bicuspid and 13/LU 2nd bicuspid Quality:  Throbbing Onset quality:  Gradual Duration:  1 day Timing:  Constant Progression:  Worsening Chronicity:  New Context: abscess and dental caries   Worsened by:  Cold food/drink Ineffective treatments:  Acetaminophen Associated symptoms: facial pain, facial swelling and gum swelling    Holly Wells is a 43 y.o. female who presents to the ED with dental pain and facial swelling that started last night. She had one Amoxicillin at home that she took. The pain and swelling has increased today. She has an appointment this week to have the tooth extracted. She denies any other problems today.   Past Medical History  Diagnosis Date  . Diabetes mellitus   . Hypertension   . Anxiety   . Fibroid tumor   . 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 Ovarian cyst   . Kidney stone   . Seasonal allergies    Past Surgical History  Procedure Laterality Date  . Cholecystectomy    . Appendectomy    . Tonsillectomy    . Stomach and bowel surg     Family History  Problem Relation Age of Onset  . Hypertension Mother   . Thyroid cancer Mother   . Diabetes Maternal Grandmother   . Hypertension Maternal Grandmother   . Breast cancer Maternal Grandmother 78  . Diabetes Maternal Grandfather   . Hypertension Maternal Grandfather   . Deep vein thrombosis Daughter    History  Substance Use Topics  . Smoking status: Former Research scientist (life sciences)  . Smokeless tobacco: Never Used  . Alcohol Use: Yes     Comment:  social   OB History   Grav Para Term Preterm Abortions TAB SAB Ect Mult Living   2 2 2       2      Review of Systems  HENT: Positive for dental problem and facial swelling.   all other systems negative  Allergies  Review of patient's allergies indicates no known allergies.  Home Medications   Prior to Admission medications   Medication Sig Start Date End Date Taking? Authorizing Provider  cephALEXin (KEFLEX) 500 MG capsule Take 1 capsule (500 mg total) by mouth 4 (four) times daily. 06/02/13   April K Palumbo-Rasch, MD  doxycycline (VIBRAMYCIN) 100 MG capsule Take 1 capsule (100 mg total) by mouth 2 (two) times daily. One po bid x 7 days 06/02/13   April K Palumbo-Rasch, MD  fluticasone [facility]) 50 MCG/ACT nasal spray Place 2 sprays into the nose daily. 08/13/11 08/12/12  Sheliah Mends, PA-C  HYDROcodone-acetaminophen (LORTAB) 5-325 MG per tablet Take 1 tablet by mouth every 6 (six) hours as needed for moderate pain. 04/30/13   Anastasio Auerbach, MD  lisinopril-hydrochlorothiazide (PRINZIDE,ZESTORETIC) 20-25 MG per tablet Take 1 tablet by mouth daily.      Historical Provider, MD  meloxicam (MOBIC) 7.5 MG tablet Take 1 tablet (7.5 mg total) by mouth daily. 06/02/13   April K Palumbo-Rasch, MD  metFORMIN (GLUCOPHAGE) 1000 MG tablet Take 1,000 mg by  mouth 2 (two) times daily.      Historical Provider, MD  MORPHINE SULFATE PO Take by mouth.    Historical Provider, MD  Multiple Vitamin (MULTIVITAMIN) tablet Take 1 tablet by mouth daily. CENTRUM     Historical Provider, MD  traMADol (ULTRAM) 50 MG tablet Take 1 tablet (50 mg total) by mouth every 6 (six) hours as needed. 06/02/13   April K Palumbo-Rasch, MD   BP 117/81  Pulse 95  Temp(Src) 98.1 F (36.7 C) (Oral)  Resp 18  Ht 5\' 6"  (1.676 m)  Wt 225 lb (102.059 kg)  BMI 36.33 kg/m2  SpO2 100% Physical Exam  Nursing note and vitals reviewed. Constitutional: She is oriented to person, place, and time.  Obese   HENT:  Mouth/Throat:  Uvula is midline, oropharynx is clear and moist and mucous membranes are normal.    Tooth decayed into the gum with swelling and erythema of the gum surrounding the tooth.  Facial swelling on the left.   Eyes: Conjunctivae and EOM are normal.  Neck: Neck supple.  Cardiovascular: Normal rate.   Pulmonary/Chest: Effort normal.  Musculoskeletal: Normal range of motion.  Lymphadenopathy:    She has cervical adenopathy.  Neurological: She is alert and oriented to person, place, and time. No cranial nerve deficit.  Skin: Skin is warm and dry.  Psychiatric: She has a normal mood and affect. Her behavior is normal.    ED Course  Procedures  MDM  43 y.o.  Female with dental pain due to abscess. Will start antibiotics and pain management and she will keep her appointment with the dentist this week. Stable for discharge without fever or signs of meningitis. Discussed with the patient and all questioned fully answered. She will return if any problems arise.    Medication List    TAKE these medications       amoxicillin 500 MG capsule  Commonly known as:  AMOXIL  Take 1 capsule (500 mg total) by mouth 3 (three) times daily.      ASK your doctor about these medications       cephALEXin 500 MG capsule  Commonly known as:  KEFLEX  Take 1 capsule (500 mg total) by mouth 4 (four) times daily.     doxycycline 100 MG capsule  Commonly known as:  VIBRAMYCIN  Take 1 capsule (100 mg total) by mouth 2 (two) times daily. One po bid x 7 days     fluticasone 50 MCG/ACT nasal spray  Commonly known as:  FLONASE  Place 2 sprays into the nose daily.     HYDROcodone-acetaminophen 5-325 MG per tablet  Commonly known as:  LORTAB  Take 1 tablet by mouth every 6 (six) hours as needed for moderate pain.  Ask about: Which instructions should I use?     HYDROcodone-acetaminophen 5-325 MG per tablet  Commonly known as:  NORCO/VICODIN  Take 2 tablets by mouth every 4 (four) hours as needed.  Ask about:  Which instructions should I use?     lisinopril-hydrochlorothiazide 20-25 MG per tablet  Commonly known as:  PRINZIDE,ZESTORETIC  Take 1 tablet by mouth daily.     meloxicam 7.5 MG tablet  Commonly known as:  MOBIC  Take 1 tablet (7.5 mg total) by mouth daily.     metFORMIN 1000 MG tablet  Commonly known as:  GLUCOPHAGE  Take 1,000 mg by mouth 2 (two) times daily.     MORPHINE SULFATE PO  Take by mouth.     multivitamin  tablet  Take 1 tablet by mouth daily. CENTRUM     traMADol 50 MG tablet  Commonly known as:  ULTRAM  Take 1 tablet (50 mg total) by mouth every 6 (six) hours as needed.            [place], [place] 07/19/13 1816

## 2013-07-19 NOTE — Discharge Instructions (Signed)
Abscessed Tooth  An abscessed tooth is an infection around your tooth. It may be caused by holes or damage to the tooth (cavity) or a dental disease. An abscessed tooth causes mild to very bad pain in and around the tooth. See your dentist right away if you have tooth or gum pain.  HOME CARE   Take your medicine as told. Finish it even if you start to feel better.   Do not drive after taking pain medicine.   Rinse your mouth (gargle) often with salt water ( teaspoon salt in 8 ounces of warm water).   Do not apply heat to the outside of your face.  GET HELP RIGHT AWAY IF:    You have a temperature by mouth above 102 F (38.9 C), not controlled by medicine.   You have chills and a very bad headache.   You have problems breathing or swallowing.   Your mouth will not open.   You develop puffiness (swelling) on the neck or around the eye.   Your pain is not helped by medicine.   Your pain is getting worse instead of better.  MAKE SURE YOU:    Understand these instructions.   Will watch your condition.   Will get help right away if you are not doing well or get worse.  Document Released: 07/11/2007 Document Revised: 04/16/2011 Document Reviewed: 05/02/2010  [employer] Patient Information 2014 [employer].

## 2013-07-19 NOTE — ED Notes (Signed)
Left side dental abscess, does have ap appt with a dentist. Last night the pain got much worse.

## 2013-07-20 NOTE — ED Provider Notes (Signed)
Medical screening examination/treatment/procedure(s) were performed by non-physician practitioner and as supervising physician I was immediately available for consultation/collaboration.   EKG Interpretation None        Blanchard Kelch, MD 07/20/13 1549

## 2013-07-28 ENCOUNTER — Encounter (HOSPITAL_BASED_OUTPATIENT_CLINIC_OR_DEPARTMENT_OTHER): Payer: Self-pay | Admitting: Emergency Medicine

## 2013-07-28 ENCOUNTER — Emergency Department (HOSPITAL_BASED_OUTPATIENT_CLINIC_OR_DEPARTMENT_OTHER)
Admission: EM | Admit: 2013-07-28 | Discharge: 2013-07-28 | Discharge status: Against Medical Advice | Payer: PRIVATE HEALTH INSURANCE | Attending: Emergency Medicine | Admitting: Emergency Medicine

## 2013-07-28 DIAGNOSIS — M542 Cervicalgia: Secondary | ICD-10-CM | POA: Insufficient documentation

## 2013-07-28 DIAGNOSIS — I10 Essential (primary) hypertension: Secondary | ICD-10-CM | POA: Insufficient documentation

## 2013-07-28 DIAGNOSIS — E119 Type 2 diabetes mellitus without complications: Secondary | ICD-10-CM | POA: Insufficient documentation

## 2013-07-28 DIAGNOSIS — Z87891 Personal history of nicotine dependence: Secondary | ICD-10-CM | POA: Insufficient documentation

## 2013-07-28 NOTE — ED Notes (Signed)
Pt called x 3 with no repsonse, pt LWBS

## 2013-07-28 NOTE — ED Notes (Signed)
C/o neck pain since Friday, denies injury. Pt denies relief from oxycodone

## 2013-07-28 NOTE — ED Notes (Signed)
Pt called x 1 with no response

## 2013-11-08 ENCOUNTER — Telehealth: Payer: Self-pay | Admitting: Gynecology

## 2013-11-08 NOTE — Telephone Encounter (Signed)
On Call Note:  C/O ovulatory pain C/W her past history of pain and right adnexal cyst.  Requesting pain medication.  Tylenol #3 #20 2 PO Q 6 hours no refill prescribed.  Pharmacy number [phone]

## 2013-12-03 ENCOUNTER — Emergency Department (HOSPITAL_BASED_OUTPATIENT_CLINIC_OR_DEPARTMENT_OTHER)
Admission: EM | Admit: 2013-12-03 | Discharge: 2013-12-03 | Disposition: A | Discharge status: Home / Self Care | Payer: Medicaid Other | Attending: Emergency Medicine | Admitting: Emergency Medicine

## 2013-12-03 ENCOUNTER — Encounter (HOSPITAL_BASED_OUTPATIENT_CLINIC_OR_DEPARTMENT_OTHER): Payer: Self-pay | Admitting: Emergency Medicine

## 2013-12-03 DIAGNOSIS — Z79899 Other long term (current) drug therapy: Secondary | ICD-10-CM | POA: Diagnosis not present

## 2013-12-03 DIAGNOSIS — Z7951 Long term (current) use of inhaled steroids: Secondary | ICD-10-CM | POA: Insufficient documentation

## 2013-12-03 DIAGNOSIS — Z8742 Personal history of other diseases of the female genital tract: Secondary | ICD-10-CM | POA: Diagnosis not present

## 2013-12-03 DIAGNOSIS — J029 Acute pharyngitis, unspecified: Secondary | ICD-10-CM | POA: Diagnosis present

## 2013-12-03 DIAGNOSIS — H9201 Otalgia, right ear: Secondary | ICD-10-CM | POA: Insufficient documentation

## 2013-12-03 DIAGNOSIS — Z792 Long term (current) use of antibiotics: Secondary | ICD-10-CM | POA: Diagnosis not present

## 2013-12-03 DIAGNOSIS — Z87442 Personal history of urinary calculi: Secondary | ICD-10-CM | POA: Diagnosis not present

## 2013-12-03 DIAGNOSIS — R0982 Postnasal drip: Secondary | ICD-10-CM | POA: Diagnosis not present

## 2013-12-03 DIAGNOSIS — F419 Anxiety disorder, unspecified: Secondary | ICD-10-CM | POA: Diagnosis not present

## 2013-12-03 DIAGNOSIS — Z791 Long term (current) use of non-steroidal anti-inflammatories (NSAID): Secondary | ICD-10-CM | POA: Diagnosis not present

## 2013-12-03 DIAGNOSIS — I10 Essential (primary) hypertension: Secondary | ICD-10-CM | POA: Insufficient documentation

## 2013-12-03 DIAGNOSIS — E119 Type 2 diabetes mellitus without complications: Secondary | ICD-10-CM | POA: Insufficient documentation

## 2013-12-03 DIAGNOSIS — Z87891 Personal history of nicotine dependence: Secondary | ICD-10-CM | POA: Insufficient documentation

## 2013-12-03 LAB — RAPID STREP SCREEN (MED CTR MEBANE ONLY): Streptococcus, Group A Screen (Direct): NEGATIVE

## 2013-12-03 MED ORDER — MELOXICAM 7.5 MG PO TABS: 7.5000 mg | ORAL_TABLET | Freq: Every day | ORAL | Status: DC

## 2013-12-03 MED ORDER — GI COCKTAIL ~~LOC~~
30.0000 mL | Freq: Once | ORAL | Status: AC
  Administered 2013-12-03: 30 mL via ORAL
  Filled 2013-12-03: qty 30

## 2013-12-03 MED ORDER — CETIRIZINE HCL 10 MG PO CAPS: 10.0000 mg | ORAL_CAPSULE | Freq: Every day | ORAL | Status: DC

## 2013-12-03 MED ORDER — FLUTICASONE PROPIONATE 50 MCG/ACT NA SUSP: 2.0000 | Freq: Every day | NASAL | Status: DC

## 2013-12-03 NOTE — ED Notes (Signed)
Pt reports sore throat and bilateral ear pain

## 2013-12-03 NOTE — ED Provider Notes (Signed)
CSN: [id number]     Arrival date & time 12/03/13  5397 History   None    Chief Complaint  Patient presents with  . Sore Throat     (Consider location/radiation/quality/duration/timing/severity/associated sxs/prior Treatment) Patient is a 43 y.o. female presenting with pharyngitis. The history is provided by the patient. No language interpreter was used.  Sore Throat This is a new problem. The current episode started more than 2 days ago. The problem occurs constantly. The problem has been gradually worsening. Pertinent negatives include no chest pain, no abdominal pain, no headaches and no shortness of breath. Nothing aggravates the symptoms. Nothing relieves the symptoms. She has tried nothing for the symptoms. The treatment provided no relief.  Also having Ear pain R> L and feels like the right ear is draining.  This is new this evening.    Past Medical History  Diagnosis Date  . Diabetes mellitus   . Hypertension   . Anxiety   . Fibroid tumor   . 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 Ovarian cyst   . Kidney stone   . Seasonal allergies    Past Surgical History  Procedure Laterality Date  . Cholecystectomy    . Appendectomy    . Tonsillectomy    . Stomach and bowel surg     Family History  Problem Relation Age of Onset  . Hypertension Mother   . Thyroid cancer Mother   . Diabetes Maternal Grandmother   . Hypertension Maternal Grandmother   . Breast cancer Maternal Grandmother 78  . Diabetes Maternal Grandfather   . Hypertension Maternal Grandfather   . Deep vein thrombosis Daughter    History  Substance Use Topics  . Smoking status: Former Research scientist (life sciences)  . Smokeless tobacco: Never Used  . Alcohol Use: Yes     Comment: social   OB History   Grav Para Term Preterm Abortions TAB SAB Ect Mult Living   2 2 2       2      Review of Systems  Constitutional: Negative for fever.  HENT: Positive for ear pain and sore throat. Negative for  drooling, ear discharge, trouble swallowing and voice change.   Respiratory: Negative for shortness of breath.   Cardiovascular: Negative for chest pain.  Gastrointestinal: Negative for abdominal pain.  Neurological: Negative for headaches.  All other systems reviewed and are negative.     Allergies  Review of patient's allergies indicates no known allergies.  Home Medications   Prior to Admission medications   Medication Sig Start Date End Date Taking? Authorizing Provider  lisinopril-hydrochlorothiazide (PRINZIDE,ZESTORETIC) 20-25 MG per tablet Take 1 tablet by mouth daily.     Yes Historical Provider, MD  metFORMIN (GLUCOPHAGE) 1000 MG tablet Take 1,000 mg by mouth 2 (two) times daily.     Yes Historical Provider, MD  MORPHINE SULFATE PO Take by mouth.   Yes Historical Provider, MD  Multiple Vitamin (MULTIVITAMIN) tablet Take 1 tablet by mouth daily. CENTRUM    Yes Historical Provider, MD  amoxicillin (AMOXIL) 500 MG capsule Take 1 capsule (500 mg total) by mouth 3 (three) times daily. 07/19/13   Hope Bunnie Pion, NP  cephALEXin (KEFLEX) 500 MG capsule Take 1 capsule (500 mg total) by mouth 4 (four) times daily. 06/02/13   Jazmeen Axtell K Lotus Santillo-Rasch, MD  doxycycline (VIBRAMYCIN) 100 MG capsule Take 1 capsule (100 mg total) by mouth 2 (two) times daily. One po bid x 7 days  06/02/13   Ane Conerly K Marquis Diles-Rasch, MD  fluticasone [facility]) 50 MCG/ACT nasal spray Place 2 sprays into the nose daily. 08/13/11 08/12/12  Sheliah Mends, PA-C  HYDROcodone-acetaminophen (LORTAB) 5-325 MG per tablet Take 1 tablet by mouth every 6 (six) hours as needed for moderate pain. 04/30/13   Anastasio Auerbach, MD  HYDROcodone-acetaminophen (NORCO/VICODIN) 5-325 MG per tablet Take 2 tablets by mouth every 4 (four) hours as needed. 07/19/13   Hope Bunnie Pion, NP  meloxicam (MOBIC) 7.5 MG tablet Take 1 tablet (7.5 mg total) by mouth daily. 06/02/13   Milagros Middendorf K Verma Grothaus-Rasch, MD  traMADol (ULTRAM) 50 MG tablet Take 1 tablet (50 mg total)  by mouth every 6 (six) hours as needed. 06/02/13   Burnie Therien K Jaye Saal-Rasch, MD   BP 139/80  Pulse 83  Temp(Src) 98.7 F (37.1 C) (Oral)  Resp 18  Ht 5\' 5"  (1.651 m)  Wt 220 lb (99.791 kg)  BMI 36.61 kg/m2  SpO2 100%  LMP 11/19/2013 Physical Exam  Constitutional: She is oriented to person, place, and time. She appears well-developed and well-nourished. No distress.  HENT:  Head: Normocephalic and atraumatic.  Right Ear: No mastoid tenderness. Tympanic membrane is not injected, not perforated, not erythematous, not retracted and not bulging.  Left Ear: No mastoid tenderness. Tympanic membrane is not injected, not perforated, not erythematous, not retracted and not bulging.  Mouth/Throat: Oropharynx is clear and moist. No oropharyngeal exudate.  Clear colorless post nasal drip.  No swelling of the lips tongue or uvula.  Mallempati class 1  Eyes: Conjunctivae are normal. Pupils are equal, round, and reactive to light.  Neck: Normal range of motion. Neck supple. No tracheal deviation present.  No pain with displacement of the trachea  Cardiovascular: Normal rate and regular rhythm.   Pulmonary/Chest: Effort normal and breath sounds normal. No stridor. She has no wheezes.  Abdominal: Soft. Bowel sounds are normal.  Musculoskeletal: Normal range of motion.  Lymphadenopathy:    She has no cervical adenopathy.  Neurological: She is alert and oriented to person, place, and time.  Skin: Skin is warm and dry.  Psychiatric: She has a normal mood and affect.    ED Course  Procedures (including critical care time) Labs Review Labs Reviewed  RAPID STREP SCREEN    Imaging Review No results found.   EKG Interpretation None      MDM   Final diagnoses:  None   Based on Centor criteria no indication for further testing.  Symptoms are likely viral in nature.  There is no swelling no LAN.  Intact phonation.  Will treat with Flonase zyrtec and meloxicam.  Return for change in voice  difficulty speaking or swallowing swelling shortness of breath or any concerns    Helmuth Recupero K Cohl Behrens-Rasch, MD 12/03/13 [phone]

## 2013-12-05 LAB — CULTURE, GROUP A STREP

## 2013-12-07 ENCOUNTER — Encounter (HOSPITAL_BASED_OUTPATIENT_CLINIC_OR_DEPARTMENT_OTHER): Payer: Self-pay | Admitting: Emergency Medicine

## 2014-04-04 ENCOUNTER — Telehealth: Payer: Self-pay | Admitting: Obstetrics and Gynecology

## 2014-04-04 ENCOUNTER — Encounter (HOSPITAL_BASED_OUTPATIENT_CLINIC_OR_DEPARTMENT_OTHER): Payer: Self-pay

## 2014-04-04 ENCOUNTER — Emergency Department (HOSPITAL_BASED_OUTPATIENT_CLINIC_OR_DEPARTMENT_OTHER)
Admission: EM | Admit: 2014-04-04 | Discharge: 2014-04-04 | Disposition: A | Discharge status: Home / Self Care | Payer: Medicaid Other | Attending: Emergency Medicine | Admitting: Emergency Medicine

## 2014-04-04 DIAGNOSIS — Z791 Long term (current) use of non-steroidal anti-inflammatories (NSAID): Secondary | ICD-10-CM | POA: Diagnosis not present

## 2014-04-04 DIAGNOSIS — Z9049 Acquired absence of other specified parts of digestive tract: Secondary | ICD-10-CM | POA: Diagnosis not present

## 2014-04-04 DIAGNOSIS — R109 Unspecified abdominal pain: Secondary | ICD-10-CM | POA: Diagnosis present

## 2014-04-04 DIAGNOSIS — Z3202 Encounter for pregnancy test, result negative: Secondary | ICD-10-CM | POA: Insufficient documentation

## 2014-04-04 DIAGNOSIS — Z792 Long term (current) use of antibiotics: Secondary | ICD-10-CM | POA: Insufficient documentation

## 2014-04-04 DIAGNOSIS — Z87442 Personal history of urinary calculi: Secondary | ICD-10-CM | POA: Insufficient documentation

## 2014-04-04 DIAGNOSIS — R112 Nausea with vomiting, unspecified: Secondary | ICD-10-CM | POA: Diagnosis not present

## 2014-04-04 DIAGNOSIS — E119 Type 2 diabetes mellitus without complications: Secondary | ICD-10-CM | POA: Insufficient documentation

## 2014-04-04 DIAGNOSIS — Z87448 Personal history of other diseases of urinary system: Secondary | ICD-10-CM | POA: Diagnosis not present

## 2014-04-04 DIAGNOSIS — F419 Anxiety disorder, unspecified: Secondary | ICD-10-CM | POA: Diagnosis not present

## 2014-04-04 DIAGNOSIS — R102 Pelvic and perineal pain: Secondary | ICD-10-CM | POA: Diagnosis not present

## 2014-04-04 DIAGNOSIS — Z7952 Long term (current) use of systemic steroids: Secondary | ICD-10-CM | POA: Diagnosis not present

## 2014-04-04 DIAGNOSIS — I10 Essential (primary) hypertension: Secondary | ICD-10-CM | POA: Diagnosis not present

## 2014-04-04 LAB — URINALYSIS, ROUTINE W REFLEX MICROSCOPIC
BILIRUBIN URINE: NEGATIVE
GLUCOSE, UA: 250 mg/dL — AB
Hgb urine dipstick: NEGATIVE
Ketones, ur: NEGATIVE mg/dL
Nitrite: NEGATIVE
Protein, ur: NEGATIVE mg/dL
SPECIFIC GRAVITY, URINE: 1.024 (ref 1.005–1.030)
UROBILINOGEN UA: 0.2 mg/dL (ref 0.0–1.0)
pH: 5.5 (ref 5.0–8.0)

## 2014-04-04 LAB — URINE MICROSCOPIC-ADD ON

## 2014-04-04 LAB — PREGNANCY, URINE: PREG TEST UR: NEGATIVE

## 2014-04-04 MED ORDER — HYDROCODONE-ACETAMINOPHEN 5-325 MG PO TABS: 1.0000 | ORAL_TABLET | ORAL | Status: DC | PRN

## 2014-04-04 MED ORDER — DIPHENHYDRAMINE HCL 50 MG/ML IJ SOLN
INTRAMUSCULAR | Status: AC
  Administered 2014-04-04: 25 mg via INTRAVENOUS
  Filled 2014-04-04: qty 1

## 2014-04-04 MED ORDER — MORPHINE SULFATE 4 MG/ML IJ SOLN
8.0000 mg | Freq: Once | INTRAMUSCULAR | Status: AC
  Administered 2014-04-04: 8 mg via INTRAVENOUS
  Filled 2014-04-04: qty 2

## 2014-04-04 MED ORDER — HYDROMORPHONE HCL 1 MG/ML IJ SOLN
1.0000 mg | Freq: Once | INTRAMUSCULAR | Status: AC
  Administered 2014-04-04: 1 mg via INTRAVENOUS
  Filled 2014-04-04: qty 1

## 2014-04-04 MED ORDER — DIPHENHYDRAMINE HCL 50 MG/ML IJ SOLN
25.0000 mg | Freq: Once | INTRAMUSCULAR | Status: AC
  Administered 2014-04-04: 25 mg via INTRAVENOUS

## 2014-04-04 MED ORDER — ONDANSETRON HCL 4 MG/2ML IJ SOLN
INTRAMUSCULAR | Status: AC
  Administered 2014-04-04: 09:00:00
  Filled 2014-04-04: qty 2

## 2014-04-04 NOTE — ED Notes (Signed)
MD at bedside. 

## 2014-04-04 NOTE — ED Provider Notes (Signed)
CSN: [id number]     Arrival date & time 04/04/14  0804 History   First MD Initiated Contact with Patient 04/04/14 0827     Chief Complaint  Patient presents with  . Abdominal Pain     (Consider location/radiation/quality/duration/timing/severity/associated sxs/prior Treatment) HPI Comments: Patient with a history of ovarian cysts presents with right lower pelvic pain. She states it started at 7:00 this morning and is been constant since that time. She's had associated nausea and vomiting. She denies any fevers. She denies any vaginal bleeding or discharge. She states she has a long-standing history of ovarian cyst on both sides and this pain is exactly the same as her past pain associated with her cysts. She sees Dr. Phineas Real who is her OB/GYN who manages her symptoms with pain medications. She takes either Ultram or morphine at home for pain.  Patient is a 44 y.o. female presenting with abdominal pain.  Abdominal Pain Associated symptoms: nausea and vomiting   Associated symptoms: no chest pain, no chills, no cough, no diarrhea, no fatigue, no fever, no hematuria, no shortness of breath, no vaginal bleeding and no vaginal discharge     Past Medical History  Diagnosis Date  . Diabetes mellitus   . Hypertension   . Anxiety   . Fibroid tumor   . 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 Ovarian cyst   . Kidney stone   . Seasonal allergies    Past Surgical History  Procedure Laterality Date  . Cholecystectomy    . Appendectomy    . Tonsillectomy    . Stomach and bowel surg     Family History  Problem Relation Age of Onset  . Hypertension Mother   . Thyroid cancer Mother   . Diabetes Maternal Grandmother   . Hypertension Maternal Grandmother   . Breast cancer Maternal Grandmother 78  . Diabetes Maternal Grandfather   . Hypertension Maternal Grandfather   . Deep vein thrombosis Daughter    History  Substance Use Topics  . Smoking status: Former  Research scientist (life sciences)  . Smokeless tobacco: Never Used  . Alcohol Use: Yes     Comment: social   OB History    Gravida Para Term Preterm AB TAB SAB Ectopic Multiple Living   2 2 2       2      Review of Systems  Constitutional: Negative for fever, chills, diaphoresis and fatigue.  HENT: Negative for congestion, rhinorrhea and sneezing.   Eyes: Negative.   Respiratory: Negative for cough, chest tightness and shortness of breath.   Cardiovascular: Negative for chest pain and leg swelling.  Gastrointestinal: Positive for nausea, vomiting and abdominal pain. Negative for diarrhea and blood in stool.  Genitourinary: Positive for pelvic pain. Negative for frequency, hematuria, flank pain, vaginal bleeding, vaginal discharge and difficulty urinating.  Musculoskeletal: Negative for back pain and arthralgias.  Skin: Negative for rash.  Neurological: Negative for dizziness, speech difficulty, weakness, numbness and headaches.      Allergies  Review of patient's allergies indicates no known allergies.  Home Medications   Prior to Admission medications   Medication Sig Start Date End Date Taking? Authorizing Provider  doxycycline (VIBRAMYCIN) 100 MG capsule Take 1 capsule (100 mg total) by mouth 2 (two) times daily. One po bid x 7 days 06/02/13   April K Palumbo-Rasch, MD  fluticasone [facility]) 50 MCG/ACT nasal spray Place 2 sprays into the nose daily. 08/13/11 08/12/12  Sheliah Mends, PA-C  fluticasone (FLONASE) 50 MCG/ACT nasal spray Place 2 sprays into both nostrils daily. 12/03/13   April K Palumbo-Rasch, MD  HYDROcodone-acetaminophen (NORCO/VICODIN) 5-325 MG per tablet Take 1-2 tablets by mouth every 4 (four) hours as needed. 04/04/14   Malvin Johns, MD  lisinopril-hydrochlorothiazide (PRINZIDE,ZESTORETIC) 20-25 MG per tablet Take 1 tablet by mouth daily.      Historical Provider, MD  meloxicam (MOBIC) 7.5 MG tablet Take 1 tablet (7.5 mg total) by mouth daily. 06/02/13   April K Palumbo-Rasch, MD   meloxicam (MOBIC) 7.5 MG tablet Take 1 tablet (7.5 mg total) by mouth daily. 12/03/13   April K Palumbo-Rasch, MD  metFORMIN (GLUCOPHAGE) 1000 MG tablet Take 1,000 mg by mouth 2 (two) times daily.      Historical Provider, MD  MORPHINE SULFATE PO Take by mouth.    Historical Provider, MD  Multiple Vitamin (MULTIVITAMIN) tablet Take 1 tablet by mouth daily. CENTRUM     Historical Provider, MD  traMADol (ULTRAM) 50 MG tablet Take 1 tablet (50 mg total) by mouth every 6 (six) hours as needed. 06/02/13   April K Palumbo-Rasch, MD   BP 128/78 mmHg  Pulse 63  Temp(Src) 97.7 F (36.5 C) (Oral)  Resp 16  SpO2 97% Physical Exam  Constitutional: She is oriented to person, place, and time. She appears well-developed and well-nourished.  HENT:  Head: Normocephalic and atraumatic.  Eyes: Pupils are equal, round, and reactive to light.  Neck: Normal range of motion. Neck supple.  Cardiovascular: Normal rate, regular rhythm and normal heart sounds.   Pulmonary/Chest: Effort normal and breath sounds normal. No respiratory distress. She has no wheezes. She has no rales. She exhibits no tenderness.  Abdominal: Soft. Bowel sounds are normal. There is tenderness (+tenderness to right lower abdomen). There is no rebound and no guarding.  Musculoskeletal: Normal range of motion. She exhibits no edema.  Lymphadenopathy:    She has no cervical adenopathy.  Neurological: She is alert and oriented to person, place, and time.  Skin: Skin is warm and dry. No rash noted.  Psychiatric: She has a normal mood and affect.    ED Course  Procedures (including critical care time) Labs Review Results for orders placed or performed during the hospital encounter of 04/04/14  Urinalysis, Routine w reflex microscopic  Result Value Ref Range   Color, Urine YELLOW YELLOW   APPearance CLOUDY (A) CLEAR   Specific Gravity, Urine 1.024 1.005 - 1.030   pH 5.5 5.0 - 8.0   Glucose, UA 250 (A) NEGATIVE mg/dL   Hgb urine  dipstick NEGATIVE NEGATIVE   Bilirubin Urine NEGATIVE NEGATIVE   Ketones, ur NEGATIVE NEGATIVE mg/dL   Protein, ur NEGATIVE NEGATIVE mg/dL   Urobilinogen, UA 0.2 0.0 - 1.0 mg/dL   Nitrite NEGATIVE NEGATIVE   Leukocytes, UA MODERATE (A) NEGATIVE  Pregnancy, urine  Result Value Ref Range   Preg Test, Ur NEGATIVE NEGATIVE  Urine microscopic-add on  Result Value Ref Range   Squamous Epithelial / LPF RARE RARE   WBC, UA TOO NUMEROUS TO COUNT <3 WBC/hpf   RBC / HPF 0-2 <3 RBC/hpf   Bacteria, UA MANY (A) RARE   No results found.    Imaging Review No results found.   EKG Interpretation None      MDM   Final diagnoses:  Pelvic pain in female    Patient presents with pain in her right lower pelvis. She states her pain is the same as it always is with her ovarian cyst.  She received pain medication in the ED and feels better. She still rates her pain as a 7 out of 10 but she is talking very comfortably and doesn't want any more pain medication. She wants to go home and get a prescription for some pain medication. I talked to her about doing imaging studies but she says that she has appointment next week with her OB/GYN where she supposed to have a repeat ultrasound and she doesn't wear any imaging studies done today. She has some bacteria in her urine but she currently doesn't have any urinary symptoms. I will send it for culture.    Malvin Johns, MD 04/04/14 1228

## 2014-04-04 NOTE — Telephone Encounter (Signed)
Phone call 7:10 am on Sunday morning returned to the patient.  Asking for pain medication to be called in to her pharmacy.  States right sided pain that is constant.  Known history of right ovarian cyst.  Denies nausea, vomiting, diarrhea, or fever.  Passing gas and has normal bowel function.   I recommended evaluation with urgent care of the emergency department.  (Last office visit was 10 months ago.) I declined to fill a narcotic or narcotic equivalent for the patient.  "You can't call it in even if it is generic?" I stated no and could not finish my sentence before the patient stated "I will just call Dr. Phineas Real" and then hung up the phone on me.

## 2014-04-04 NOTE — ED Notes (Signed)
D/c home with ride- rx x 1 given for hydrocodone

## 2014-04-04 NOTE — Discharge Instructions (Signed)
Pelvic Pain Female pelvic pain can be caused by many different things and start from a variety of places. Pelvic pain refers to pain that is located in the lower half of the abdomen and between your hips. The pain may occur over a short period of time (acute) or may be reoccurring (chronic). The cause of pelvic pain may be related to disorders affecting the female reproductive organs (gynecologic), but it may also be related to the bladder, kidney stones, an intestinal complication, or muscle or skeletal problems. Getting help right away for pelvic pain is important, especially if there has been severe, sharp, or a sudden onset of unusual pain. It is also important to get help right away because some types of pelvic pain can be life threatening.  CAUSES  Below are only some of the causes of pelvic pain. The causes of pelvic pain can be in one of several categories.   Gynecologic.  Pelvic inflammatory disease.  Sexually transmitted infection.  Ovarian cyst or a twisted ovarian ligament (ovarian torsion).  Uterine lining that grows outside the uterus (endometriosis).  Fibroids, cysts, or tumors.  Ovulation.  Pregnancy.  Pregnancy that occurs outside the uterus (ectopic pregnancy).  Miscarriage.  Labor.  Abruption of the placenta or ruptured uterus.  Infection.  Uterine infection (endometritis).  Bladder infection.  Diverticulitis.  Miscarriage related to a uterine infection (septic abortion).  Bladder.  Inflammation of the bladder (cystitis).  Kidney stone(s).  Gastrointestinal.  Constipation.  Diverticulitis.  Neurologic.  Trauma.  Feeling pelvic pain because of mental or emotional causes (psychosomatic).  Cancers of the bowel or pelvis. EVALUATION  Your caregiver will want to take a careful history of your concerns. This includes recent changes in your health, a careful gynecologic history of your periods (menses), and a sexual history. Obtaining your family  history and medical history is also important. Your caregiver may suggest a pelvic exam. A pelvic exam will help identify the location and severity of the pain. It also helps in the evaluation of which organ system may be involved. In order to identify the cause of the pelvic pain and be properly treated, your caregiver may order tests. These tests may include:   A pregnancy test.  Pelvic ultrasonography.  An X-ray exam of the abdomen.  A urinalysis or evaluation of vaginal discharge.  Blood tests. HOME CARE INSTRUCTIONS   Only take over-the-counter or prescription medicines for pain, discomfort, or fever as directed by your caregiver.   Rest as directed by your caregiver.   Eat a balanced diet.   Drink enough fluids to make your urine clear or pale yellow, or as directed.   Avoid sexual intercourse if it causes pain.   Apply warm or cold compresses to the lower abdomen depending on which one helps the pain.   Avoid stressful situations.   Keep a journal of your pelvic pain. Write down when it started, where the pain is located, and if there are things that seem to be associated with the pain, such as food or your menstrual cycle.  Follow up with your caregiver as directed.  SEEK MEDICAL CARE IF:  Your medicine does not help your pain.  You have abnormal vaginal discharge. SEEK IMMEDIATE MEDICAL CARE IF:   You have heavy bleeding from the vagina.   Your pelvic pain increases.   You feel light-headed or faint.   You have chills.   You have pain with urination or blood in your urine.   You have uncontrolled diarrhea   or vomiting.   You have a fever or persistent symptoms for more than 3 days.  You have a fever and your symptoms suddenly get worse.   You are being physically or sexually abused.  MAKE SURE YOU:  Understand these instructions.  Will watch your condition.  Will get help if you are not doing well or get worse. Document Released:  12/20/2003 Document Revised: 06/08/2013 Document Reviewed: 05/14/2011 Exit[employer] Patient Information 2015 [employer]. This information is not intended to replace advice given to you by your health care provider. Make sure you discuss any questions you have with your health care provider.  

## 2014-04-04 NOTE — ED Notes (Signed)
Pt's husband to desk asking when pt will be able to go home- states pt feels better and is sleeping- EDP Belfi present for conversation

## 2014-04-04 NOTE — ED Notes (Signed)
Patient here with lower abdominal pain since 0700 with nausea. Reports that she has history of ovarian cyst. Crying on assessment

## 2014-04-06 LAB — URINE CULTURE: Colony Count: >=100000

## 2014-04-07 ENCOUNTER — Telehealth (HOSPITAL_BASED_OUTPATIENT_CLINIC_OR_DEPARTMENT_OTHER): Payer: Self-pay | Admitting: Emergency Medicine

## 2014-04-07 NOTE — Telephone Encounter (Signed)
Post ED Visit - Positive Culture Follow-up  Culture report reviewed by antimicrobial stewardship pharmacist: []  Wes Mercer, Pharm.D., BCPS [x]  Heide Guile, Pharm.D., BCPS []  Alycia Rossetti, Pharm.D., BCPS []  [place], [place].D., BCPS, AAHIVP []  Legrand Como, Pharm.D., BCPS, AAHIVP []  Isac Sarna, Pharm.D., BCPS Al Corpus PA  Positive urine culture No further treatment at this tiime  Holly Wells 04/07/2014, 5:26 PM

## 2014-05-27 ENCOUNTER — Telehealth: Payer: Self-pay | Admitting: *Deleted

## 2014-05-27 MED ORDER — TRAMADOL HCL 50 MG PO TABS: 50.0000 mg | ORAL_TABLET | Freq: Four times a day (QID) | ORAL | Status: DC | PRN

## 2014-05-27 NOTE — Telephone Encounter (Signed)
Pt informed with the below note. Rx called

## 2014-05-27 NOTE — Telephone Encounter (Signed)
Pt called c/o intermittent pelvic pain, pain level now about 5-6 since taking ultram 50 mg which was given by hospital back in feb. Pt is now out of ultram and asked if you would be willing to refill Rx for her? She takes 2 pills at times which depends on pain level. Pt is scheduled for annual in June, I advised OV best she requested me to check with you first. Please advise

## 2014-05-27 NOTE — Telephone Encounter (Signed)
This is a chronic problem and I am comfortable with Ultram 50 mg #30 one by mouth every 6 hours when necessary with 1 refill.

## 2014-07-28 ENCOUNTER — Encounter: Payer: Medicaid Other | Admitting: Gynecology

## 2014-11-04 ENCOUNTER — Other Ambulatory Visit: Payer: Self-pay | Admitting: Gynecology

## 2014-11-04 DIAGNOSIS — Z1231 Encounter for screening mammogram for malignant neoplasm of breast: Secondary | ICD-10-CM

## 2014-11-09 ENCOUNTER — Ambulatory Visit (HOSPITAL_BASED_OUTPATIENT_CLINIC_OR_DEPARTMENT_OTHER)
Admission: RE | Admit: 2014-11-09 | Discharge: 2014-11-09 | Disposition: A | Discharge status: Home / Self Care | Payer: Self-pay | Source: Ambulatory Visit | Attending: Gynecology | Admitting: Gynecology

## 2014-11-09 DIAGNOSIS — Z1231 Encounter for screening mammogram for malignant neoplasm of breast: Secondary | ICD-10-CM | POA: Insufficient documentation

## 2014-11-18 ENCOUNTER — Emergency Department (HOSPITAL_BASED_OUTPATIENT_CLINIC_OR_DEPARTMENT_OTHER)
Admission: EM | Admit: 2014-11-18 | Discharge: 2014-11-18 | Disposition: A | Discharge status: Home / Self Care | Payer: Medicaid Other | Attending: Emergency Medicine | Admitting: Emergency Medicine

## 2014-11-18 ENCOUNTER — Encounter (HOSPITAL_BASED_OUTPATIENT_CLINIC_OR_DEPARTMENT_OTHER): Payer: Self-pay | Admitting: Adult Health

## 2014-11-18 DIAGNOSIS — I10 Essential (primary) hypertension: Secondary | ICD-10-CM | POA: Insufficient documentation

## 2014-11-18 DIAGNOSIS — Z3202 Encounter for pregnancy test, result negative: Secondary | ICD-10-CM | POA: Insufficient documentation

## 2014-11-18 DIAGNOSIS — Z87891 Personal history of nicotine dependence: Secondary | ICD-10-CM | POA: Insufficient documentation

## 2014-11-18 DIAGNOSIS — Z87442 Personal history of urinary calculi: Secondary | ICD-10-CM | POA: Insufficient documentation

## 2014-11-18 DIAGNOSIS — Z86018 Personal history of other benign neoplasm: Secondary | ICD-10-CM | POA: Insufficient documentation

## 2014-11-18 DIAGNOSIS — F419 Anxiety disorder, unspecified: Secondary | ICD-10-CM | POA: Insufficient documentation

## 2014-11-18 DIAGNOSIS — N83202 Unspecified ovarian cyst, left side: Secondary | ICD-10-CM | POA: Insufficient documentation

## 2014-11-18 DIAGNOSIS — E119 Type 2 diabetes mellitus without complications: Secondary | ICD-10-CM | POA: Insufficient documentation

## 2014-11-18 DIAGNOSIS — Z79899 Other long term (current) drug therapy: Secondary | ICD-10-CM | POA: Insufficient documentation

## 2014-11-18 DIAGNOSIS — N83201 Unspecified ovarian cyst, right side: Secondary | ICD-10-CM

## 2014-11-18 DIAGNOSIS — Z7951 Long term (current) use of inhaled steroids: Secondary | ICD-10-CM | POA: Insufficient documentation

## 2014-11-18 DIAGNOSIS — R350 Frequency of micturition: Secondary | ICD-10-CM | POA: Insufficient documentation

## 2014-11-18 LAB — URINALYSIS, ROUTINE W REFLEX MICROSCOPIC
Bilirubin Urine: NEGATIVE
Glucose, UA: 100 mg/dL — AB
Hgb urine dipstick: NEGATIVE
Ketones, ur: 15 mg/dL — AB
Leukocytes, UA: NEGATIVE
Nitrite: NEGATIVE
Protein, ur: NEGATIVE mg/dL
Specific Gravity, Urine: 1.027 (ref 1.005–1.030)
Urobilinogen, UA: 0.2 mg/dL (ref 0.0–1.0)
pH: 5.5 (ref 5.0–8.0)

## 2014-11-18 LAB — WET PREP, GENITAL
Clue Cells Wet Prep HPF POC: NONE SEEN
Trich, Wet Prep: NONE SEEN
Yeast Wet Prep HPF POC: NONE SEEN

## 2014-11-18 LAB — PREGNANCY, URINE: Preg Test, Ur: NEGATIVE

## 2014-11-18 MED ORDER — OXYCODONE HCL 5 MG PO TABS: 5.0000 mg | ORAL_TABLET | ORAL | Status: DC | PRN

## 2014-11-18 MED ORDER — IBUPROFEN 800 MG PO TABS
800.0000 mg | ORAL_TABLET | Freq: Once | ORAL | Status: AC
  Administered 2014-11-18: 800 mg via ORAL
  Filled 2014-11-18: qty 1

## 2014-11-18 MED ORDER — ACETAMINOPHEN 500 MG PO TABS
1000.0000 mg | ORAL_TABLET | Freq: Once | ORAL | Status: AC
  Administered 2014-11-18: 1000 mg via ORAL
  Filled 2014-11-18: qty 2

## 2014-11-18 MED ORDER — OXYCODONE HCL 5 MG PO TABS
5.0000 mg | ORAL_TABLET | Freq: Once | ORAL | Status: AC
  Administered 2014-11-18: 5 mg via ORAL
  Filled 2014-11-18: qty 1

## 2014-11-18 NOTE — ED Notes (Signed)
C/o back pain  Hx of ovarian cyst, denies burning burning  But is having increased freq

## 2014-11-18 NOTE — ED Provider Notes (Signed)
CSN: [id number]     Arrival date & time 11/18/14  2012 History  By signing my name below, I, Holly Wells, attest that this documentation has been prepared under the direction and in the presence of Holly Etienne, DO.  Electronically Signed: Tula Wells, ED Scribe. 11/18/2014. 9:17 PM.  Chief Complaint  Patient presents with  . Back Pain   The history is provided by the patient. No language interpreter was used.    HPI Comments: Holly Wells is a 44 y.o. female with a history of ovarian cysts, fibroids and kidney stones who presents to the Emergency Department complaining of bilateral pelvic pain, worse on the left, that started 1 week ago and became worse today. She states lower back pain and urinary frequency as associated symptoms. Pt notes that pain worsens and radiates down her bilateral legs with standing. She has tried Toradol with no relief. Pt has a history of ovarian cysts and states current symptoms are similar. She denies dysuria, vaginal bleeding, vaginal discharge, fever, chills and abdominal pain.   Past Medical History  Diagnosis Date  . Diabetes mellitus   . Hypertension   . Anxiety   . Fibroid tumor   . 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 Ovarian cyst   . Kidney stone   . Seasonal allergies    Past Surgical History  Procedure Laterality Date  . Cholecystectomy    . Appendectomy    . Tonsillectomy    . Stomach and bowel surg     Family History  Problem Relation Age of Onset  . Hypertension Mother   . Thyroid cancer Mother   . Diabetes Maternal Grandmother   . Hypertension Maternal Grandmother   . Breast cancer Maternal Grandmother 78  . Diabetes Maternal Grandfather   . Hypertension Maternal Grandfather   . Deep vein thrombosis Daughter    Social History  Substance Use Topics  . Smoking status: Former Research scientist (life sciences)  . Smokeless tobacco: Never Used  . Alcohol Use: Yes     Comment: social   OB History    Gravida Para Term  Preterm AB TAB SAB Ectopic Multiple Living   2 2 2       2      Review of Systems  Constitutional: Negative for fever and chills.  HENT: Negative for congestion and rhinorrhea.   Eyes: Negative for redness and visual disturbance.  Respiratory: Negative for shortness of breath and wheezing.   Cardiovascular: Negative for chest pain and palpitations.  Gastrointestinal: Negative for nausea, vomiting and abdominal pain.  Genitourinary: Positive for frequency and pelvic pain. Negative for dysuria, urgency, vaginal bleeding and vaginal discharge.  Musculoskeletal: Positive for back pain. Negative for myalgias and arthralgias.  Skin: Negative for pallor and wound.  Neurological: Negative for dizziness and headaches.  All other systems reviewed and are negative.  Allergies  Review of patient's allergies indicates no known allergies.  Home Medications   Prior to Admission medications   Medication Sig Start Date End Date Taking? Authorizing Provider  HYDROcodone-acetaminophen (NORCO/VICODIN) 5-325 MG per tablet Take 1-2 tablets by mouth every 4 (four) hours as needed. 04/04/14  Yes Malvin Johns, MD  lisinopril-hydrochlorothiazide (PRINZIDE,ZESTORETIC) 20-25 MG per tablet Take 1 tablet by mouth daily.     Yes Historical Provider, MD  metFORMIN (GLUCOPHAGE) 1000 MG tablet Take 1,000 mg by mouth 2 (two) times daily.     Yes Historical Provider, MD  Multiple Vitamin (MULTIVITAMIN) tablet Take 1  tablet by mouth daily. CENTRUM    Yes Historical Provider, MD  doxycycline (VIBRAMYCIN) 100 MG capsule Take 1 capsule (100 mg total) by mouth 2 (two) times daily. One po bid x 7 days 06/02/13   April Palumbo, MD  fluticasone [facility]) 50 MCG/ACT nasal spray Place 2 sprays into the nose daily. 08/13/11 08/12/12  Sheliah Mends, PA-C  fluticasone (FLONASE) 50 MCG/ACT nasal spray Place 2 sprays into both nostrils daily. 12/03/13   April Palumbo, MD  meloxicam (MOBIC) 7.5 MG tablet Take 1 tablet (7.5 mg total) by  mouth daily. 06/02/13   April Palumbo, MD  meloxicam (MOBIC) 7.5 MG tablet Take 1 tablet (7.5 mg total) by mouth daily. 12/03/13   April Palumbo, MD  MORPHINE SULFATE PO Take by mouth.    Historical Provider, MD  oxyCODONE (ROXICODONE) 5 MG immediate release tablet Take 1 tablet (5 mg total) by mouth every 4 (four) hours as needed for severe pain. 11/18/14   Holly Etienne, DO  traMADol (ULTRAM) 50 MG tablet Take 1 tablet (50 mg total) by mouth every 6 (six) hours as needed. 05/27/14   Anastasio Auerbach, MD   BP 113/74 mmHg  Pulse 77  Temp(Src) 98.2 F (36.8 C) (Oral)  Resp 18  Wt 236 lb (107.049 kg)  SpO2 99%  LMP 11/02/2014 (Exact Date) Physical Exam  Constitutional: She is oriented to person, place, and time. She appears well-developed and well-nourished. No distress.  HENT:  Head: Normocephalic and atraumatic.  Eyes: Conjunctivae and EOM are normal. Pupils are equal, round, and reactive to light.  Neck: Normal range of motion. Neck supple. No tracheal deviation present.  Cardiovascular: Normal rate, regular rhythm and normal heart sounds.  Exam reveals no gallop and no friction rub.   No murmur heard. Pulmonary/Chest: Effort normal and breath sounds normal. No respiratory distress. She has no wheezes. She has no rales.  Abdominal: Soft. She exhibits no distension. There is tenderness.  Mild suprapubic tenderness  Musculoskeletal: She exhibits tenderness. She exhibits no edema.  Bilateral lower paraspinal muscular tenderness about lower L-spine  Neurological: She is alert and oriented to person, place, and time.  Skin: Skin is warm and dry. She is not diaphoretic.  Psychiatric: She has a normal mood and affect. Her behavior is normal.  Nursing note and vitals reviewed.   ED Course  Procedures   DIAGNOSTIC STUDIES: Oxygen Saturation is 97% on RA, normal by my interpretation.    COORDINATION OF CARE: 9:18 PM Discussed treatment plan with pt which includes lab work, UA and a pelvic  exam. Pt agreed to plan.   Labs Review Labs Reviewed  WET PREP, GENITAL - Abnormal; Notable for the following:    WBC, Wet Prep HPF POC MANY (*)    All other components within normal limits  URINALYSIS, ROUTINE W REFLEX MICROSCOPIC (NOT AT [facility]) - Abnormal; Notable for the following:    Glucose, UA 100 (*)    Ketones, ur 15 (*)    All other components within normal limits  PREGNANCY, URINE  GC/CHLAMYDIA PROBE AMP ([place]) NOT AT [facility]    Imaging Review No results found. I have personally reviewed and evaluated these lab results as part of my medical decision-making.   EKG Interpretation None      MDM   Final diagnoses:  Cysts of both ovaries    44 yo F with a chief complaint of bilateral adnexal pain.  This has been going on for the past couple weeks. Similar to prior  ovarian cyst pain.  Patient having back pain going down both legs.  This is typical of her pain. UA without uti, wet prep with increased WBC.  No noted unilateral adnexal ttp.  See no need for [place] at this time.  Will treat as ovarian cyst, PCP/OB follow up.  12:13 AM:  I have discussed the diagnosis/risks/treatment options with the patient and family and believe the pt to be eligible for discharge home to follow-up with PCP. We also discussed returning to the ED immediately if new or worsening sx occur. We discussed the sx which are most concerning (e.g., sudden worsening pain, fever, inability to tolerate by mouth) that necessitate immediate return. Medications administered to the patient during their visit and any new prescriptions provided to the patient are listed below.  Medications given during this visit Medications  oxyCODONE (Oxy IR/ROXICODONE) immediate release tablet 5 mg (5 mg Oral Given 11/18/14 2131)  acetaminophen (TYLENOL) tablet 1,000 mg (1,000 mg Oral Given 11/18/14 2131)  ibuprofen (ADVIL,MOTRIN) tablet 800 mg (800 mg Oral Given 11/18/14 2131)    Discharge Medication List as of 11/18/2014  11:08 PM    START taking these medications   Details  oxyCODONE (ROXICODONE) 5 MG immediate release tablet Take 1 tablet (5 mg total) by mouth every 4 (four) hours as needed for severe pain., Starting 11/18/2014, Until Discontinued, Print        The patient appears reasonably screen and/or stabilized for discharge and I doubt any other medical condition or other [facility] requiring further screening, evaluation, or treatment in the ED at this time prior to discharge.     I personally performed the services described in this documentation, which was scribed in my presence. The recorded information has been reviewed and is accurate.    Holly Etienne, DO 11/19/14 [id number]

## 2014-11-18 NOTE — ED Notes (Signed)
Hx of ovarian cysts and lower back pain with the ovarian cysts-taking tramadol at home with no relief. Describes the pain as radiatin=g from lower back around to lower abdomen and down both legs into knees. dneis vaginal discharge, abnormal bleeding and dyuria. Endorses frequency.

## 2014-11-22 LAB — GC/CHLAMYDIA PROBE AMP (~~LOC~~) NOT AT ARMC
CHLAMYDIA, DNA PROBE: NEGATIVE
NEISSERIA GONORRHEA: NEGATIVE

## 2014-12-18 ENCOUNTER — Emergency Department (HOSPITAL_COMMUNITY): Payer: Medicaid Other

## 2014-12-18 ENCOUNTER — Emergency Department (HOSPITAL_BASED_OUTPATIENT_CLINIC_OR_DEPARTMENT_OTHER): Payer: Self-pay

## 2014-12-18 ENCOUNTER — Emergency Department (HOSPITAL_BASED_OUTPATIENT_CLINIC_OR_DEPARTMENT_OTHER)
Admission: EM | Admit: 2014-12-18 | Discharge: 2014-12-19 | Disposition: A | Discharge status: Other Acute Inpatient Hospital | Payer: Self-pay | Attending: Obstetrics & Gynecology | Admitting: Obstetrics & Gynecology

## 2014-12-18 ENCOUNTER — Encounter (HOSPITAL_BASED_OUTPATIENT_CLINIC_OR_DEPARTMENT_OTHER): Payer: Self-pay | Admitting: *Deleted

## 2014-12-18 DIAGNOSIS — F419 Anxiety disorder, unspecified: Secondary | ICD-10-CM | POA: Insufficient documentation

## 2014-12-18 DIAGNOSIS — R000 Tachycardia, unspecified: Secondary | ICD-10-CM | POA: Insufficient documentation

## 2014-12-18 DIAGNOSIS — Z3202 Encounter for pregnancy test, result negative: Secondary | ICD-10-CM | POA: Insufficient documentation

## 2014-12-18 DIAGNOSIS — Z79899 Other long term (current) drug therapy: Secondary | ICD-10-CM | POA: Insufficient documentation

## 2014-12-18 DIAGNOSIS — N83519 Torsion of ovary and ovarian pedicle, unspecified side: Secondary | ICD-10-CM | POA: Insufficient documentation

## 2014-12-18 DIAGNOSIS — N764 Abscess of vulva: Secondary | ICD-10-CM | POA: Diagnosis present

## 2014-12-18 DIAGNOSIS — R1032 Left lower quadrant pain: Secondary | ICD-10-CM | POA: Diagnosis present

## 2014-12-18 DIAGNOSIS — Z87891 Personal history of nicotine dependence: Secondary | ICD-10-CM | POA: Insufficient documentation

## 2014-12-18 DIAGNOSIS — I10 Essential (primary) hypertension: Secondary | ICD-10-CM | POA: Insufficient documentation

## 2014-12-18 DIAGNOSIS — Z7951 Long term (current) use of inhaled steroids: Secondary | ICD-10-CM | POA: Insufficient documentation

## 2014-12-18 DIAGNOSIS — Z86018 Personal history of other benign neoplasm: Secondary | ICD-10-CM | POA: Insufficient documentation

## 2014-12-18 DIAGNOSIS — Z87442 Personal history of urinary calculi: Secondary | ICD-10-CM | POA: Insufficient documentation

## 2014-12-18 DIAGNOSIS — R7989 Other specified abnormal findings of blood chemistry: Secondary | ICD-10-CM

## 2014-12-18 DIAGNOSIS — N83202 Unspecified ovarian cyst, left side: Secondary | ICD-10-CM | POA: Diagnosis present

## 2014-12-18 DIAGNOSIS — E119 Type 2 diabetes mellitus without complications: Secondary | ICD-10-CM | POA: Insufficient documentation

## 2014-12-18 LAB — CBC WITH DIFFERENTIAL/PLATELET
BASOS ABS: 0 10*3/uL (ref 0.0–0.1)
BASOS PCT: 0 %
Eosinophils Absolute: 0.1 10*3/uL (ref 0.0–0.7)
Eosinophils Relative: 1 %
HEMATOCRIT: 36.7 % (ref 36.0–46.0)
HEMOGLOBIN: 12.3 g/dL (ref 12.0–15.0)
LYMPHS PCT: 12 %
Lymphs Abs: 0.6 10*3/uL — ABNORMAL LOW (ref 0.7–4.0)
MCH: 27.3 pg (ref 26.0–34.0)
MCHC: 33.5 g/dL (ref 30.0–36.0)
MCV: 81.4 fL (ref 78.0–100.0)
Monocytes Absolute: 0.3 10*3/uL (ref 0.1–1.0)
Monocytes Relative: 5 %
Neutro Abs: 4.2 10*3/uL (ref 1.7–7.7)
Neutrophils Relative %: 82 %
Platelets: 326 10*3/uL (ref 150–400)
RBC: 4.51 MIL/uL (ref 3.87–5.11)
RDW: 12.8 % (ref 11.5–15.5)
WBC: 5.1 10*3/uL (ref 4.0–10.5)

## 2014-12-18 LAB — URINE MICROSCOPIC-ADD ON

## 2014-12-18 LAB — URINALYSIS, ROUTINE W REFLEX MICROSCOPIC
BILIRUBIN URINE: NEGATIVE
Hgb urine dipstick: NEGATIVE
KETONES UR: NEGATIVE mg/dL
Leukocytes, UA: NEGATIVE
Nitrite: NEGATIVE
PH: 5 (ref 5.0–8.0)
Protein, ur: NEGATIVE mg/dL
Specific Gravity, Urine: 1.026 (ref 1.005–1.030)
Urobilinogen, UA: 0.2 mg/dL (ref 0.0–1.0)

## 2014-12-18 LAB — COMPREHENSIVE METABOLIC PANEL
ALBUMIN: 3.6 g/dL (ref 3.5–5.0)
ALT: 18 U/L (ref 14–54)
AST: 22 U/L (ref 15–41)
Alkaline Phosphatase: 72 U/L (ref 38–126)
Anion gap: 9 (ref 5–15)
BILIRUBIN TOTAL: 0.4 mg/dL (ref 0.3–1.2)
BUN: 18 mg/dL (ref 6–20)
CO2: 22 mmol/L (ref 22–32)
CREATININE: 0.79 mg/dL (ref 0.44–1.00)
Calcium: 8.5 mg/dL — ABNORMAL LOW (ref 8.9–10.3)
Chloride: 100 mmol/L — ABNORMAL LOW (ref 101–111)
GFR calc Af Amer: >60 mL/min (ref >60)
GLUCOSE: 296 mg/dL — AB (ref 65–99)
Potassium: 4 mmol/L (ref 3.5–5.1)
Sodium: 131 mmol/L — ABNORMAL LOW (ref 135–145)
TOTAL PROTEIN: 7.4 g/dL (ref 6.5–8.1)

## 2014-12-18 LAB — WET PREP, GENITAL
Clue Cells Wet Prep HPF POC: NONE SEEN
TRICH WET PREP: NONE SEEN
WBC WET PREP: NONE SEEN
YEAST WET PREP: NONE SEEN

## 2014-12-18 LAB — D-DIMER, QUANTITATIVE: D-Dimer, Quant: 0.86 ug/mL-FEU — ABNORMAL HIGH (ref 0.00–0.48)

## 2014-12-18 LAB — LIPASE, BLOOD: LIPASE: 44 U/L (ref 11–51)

## 2014-12-18 LAB — I-STAT CG4 LACTIC ACID, ED: LACTIC ACID, VENOUS: 2.46 mmol/L — AB (ref 0.5–2.0)

## 2014-12-18 LAB — TROPONIN I: Troponin I: <0.03 ng/mL (ref <0.031)

## 2014-12-18 LAB — PREGNANCY, URINE: Preg Test, Ur: NEGATIVE

## 2014-12-18 MED ORDER — SODIUM CHLORIDE 0.9 % IV SOLN
INTRAVENOUS | Status: DC
  Administered 2014-12-18 – 2014-12-19 (×2): via INTRAVENOUS

## 2014-12-18 MED ORDER — LORAZEPAM 2 MG/ML IJ SOLN: 1.0000 mg | Freq: Once | INTRAMUSCULAR | Status: DC

## 2014-12-18 MED ORDER — METFORMIN HCL 500 MG PO TABS
1000.0000 mg | ORAL_TABLET | Freq: Once | ORAL | Status: AC
  Administered 2014-12-18: 1000 mg via ORAL
  Filled 2014-12-18: qty 2

## 2014-12-18 MED ORDER — HYDROMORPHONE HCL 1 MG/ML IJ SOLN
1.0000 mg | INTRAMUSCULAR | Status: DC | PRN
  Administered 2014-12-18 – 2014-12-19 (×3): 1 mg via INTRAVENOUS
  Filled 2014-12-18 (×3): qty 1

## 2014-12-18 MED ORDER — SODIUM CHLORIDE 0.9 % IV BOLUS (SEPSIS)
1000.0000 mL | Freq: Once | INTRAVENOUS | Status: AC
  Administered 2014-12-18: 1000 mL via INTRAVENOUS

## 2014-12-18 MED ORDER — LIDOCAINE HCL (PF) 1 % IJ SOLN
INTRAMUSCULAR | Status: AC
  Administered 2014-12-18: 5 mL
  Filled 2014-12-18: qty 5

## 2014-12-18 MED ORDER — MORPHINE SULFATE (PF) 4 MG/ML IV SOLN
4.0000 mg | INTRAVENOUS | Status: DC | PRN
  Administered 2014-12-18: 4 mg via INTRAVENOUS
  Filled 2014-12-18: qty 1

## 2014-12-18 MED ORDER — IOHEXOL 350 MG/ML SOLN
100.0000 mL | Freq: Once | INTRAVENOUS | Status: AC | PRN
  Administered 2014-12-18: 100 mL via INTRAVENOUS

## 2014-12-18 MED ORDER — ONDANSETRON HCL 4 MG/2ML IJ SOLN
4.0000 mg | Freq: Once | INTRAMUSCULAR | Status: AC
  Administered 2014-12-18: 4 mg via INTRAVENOUS
  Filled 2014-12-18: qty 2

## 2014-12-18 NOTE — ED Notes (Signed)
[facility] at bedside, report given. All personal belongs given to patient's husband and instructed to go to [facility] hospital.

## 2014-12-18 NOTE — ED Notes (Signed)
PA Elmyra Ricks at bedside. Pelvic exam completed, Lanced cyst on outside labia area after numbed with 1% lidocaine. Dr. Tomi Bamberger at bedside made [facility] aware of ultrasound results. Pt made aware of results became emotional upset, husband called to room. Attempts to console. Offered to get something for anxiety, pt refused at present time.

## 2014-12-18 NOTE — ED Provider Notes (Signed)
CSN: [id number]     Arrival date & time 12/18/14  1857 History   First MD Initiated Contact with Patient 12/18/14 1924     Chief Complaint  Patient presents with  . Abdominal Pain     (Consider location/radiation/quality/duration/timing/severity/associated sxs/prior Treatment) HPI  Blood pressure 142/87, pulse 124, temperature 99.9 F (37.7 C), temperature source Oral, resp. rate 20, height 5\' 5"  (1.651 m), weight 220 lb (99.791 kg), last menstrual period 12/05/2014, SpO2 98 %.  Holly Wells is a 44 y.o. female complaining of chills, fatigue, shortness of breath, and severe nausea onset this AM. She also has left lower quadrant abdominal pain which is consistent with prior episodes of ovarian cysts. She's never needed surgery for ovarian cysts in the past. She denies vomiting, chest pain, palpitations, syncope, diarrhea, melena, hematochezia, change in urination, abnormal vaginal discharge, history of DVT/PE, calf pain, leg swelling, recent immobilizations. Patient didn't take her morning medications because she didn't eat. She took a tramadol this morning with little relief. Did not eat or drink today.  OB/GYN [place]   Past Medical History  Diagnosis Date  . Diabetes mellitus   . Hypertension   . Anxiety   . Fibroid tumor   . 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 Ovarian cyst   . Kidney stone   . Seasonal allergies    Past Surgical History  Procedure Laterality Date  . Cholecystectomy    . Appendectomy    . Tonsillectomy    . Stomach and bowel surg     Family History  Problem Relation Age of Onset  . Hypertension Mother   . Thyroid cancer Mother   . Diabetes Maternal Grandmother   . Hypertension Maternal Grandmother   . Breast cancer Maternal Grandmother 78  . Diabetes Maternal Grandfather   . Hypertension Maternal Grandfather   . Deep vein thrombosis Daughter    Social History  Substance Use Topics  . Smoking status: Former Research scientist (life sciences)    . Smokeless tobacco: Never Used  . Alcohol Use: Yes     Comment: social   OB History    Gravida Para Term Preterm AB TAB SAB Ectopic Multiple Living   2 2 2       2      Review of Systems  10 systems reviewed and found to be negative, except as noted in the HPI.   Allergies  Review of patient's allergies indicates no known allergies.  Home Medications   Prior to Admission medications   Medication Sig Start Date End Date Taking? Authorizing Provider  lisinopril-hydrochlorothiazide (PRINZIDE,ZESTORETIC) 20-25 MG per tablet Take 1 tablet by mouth daily.     Yes Historical Provider, MD  metFORMIN (GLUCOPHAGE) 1000 MG tablet Take 1,000 mg by mouth 2 (two) times daily.     Yes Historical Provider, MD  MORPHINE SULFATE PO Take by mouth.   Yes Historical Provider, MD  Multiple Vitamin (MULTIVITAMIN) tablet Take 1 tablet by mouth daily. CENTRUM    Yes Historical Provider, MD  traMADol (ULTRAM) 50 MG tablet Take 1 tablet (50 mg total) by mouth every 6 (six) hours as needed. 05/27/14  Yes Anastasio Auerbach, MD  doxycycline (VIBRAMYCIN) 100 MG capsule Take 1 capsule (100 mg total) by mouth 2 (two) times daily. One po bid x 7 days 06/02/13   April Palumbo, MD  fluticasone [facility]) 50 MCG/ACT nasal spray Place 2 sprays into the nose daily. 08/13/11 08/12/12  Sheliah Mends, PA-C  fluticasone (FLONASE) 50 MCG/ACT nasal spray Place 2 sprays into both nostrils daily. 12/03/13   April Palumbo, MD  HYDROcodone-acetaminophen (NORCO/VICODIN) 5-325 MG per tablet Take 1-2 tablets by mouth every 4 (four) hours as needed. 04/04/14   Malvin Johns, MD  meloxicam (MOBIC) 7.5 MG tablet Take 1 tablet (7.5 mg total) by mouth daily. 06/02/13   April Palumbo, MD  meloxicam (MOBIC) 7.5 MG tablet Take 1 tablet (7.5 mg total) by mouth daily. 12/03/13   April Palumbo, MD  oxyCODONE (ROXICODONE) 5 MG immediate release tablet Take 1 tablet (5 mg total) by mouth every 4 (four) hours as needed for severe pain. 11/18/14   Deno Etienne, DO   BP 139/95 mmHg  Pulse 123  Temp(Src) 99.9 F (37.7 C) (Oral)  Resp 17  Ht 5\' 5"  (1.651 m)  Wt 220 lb (99.791 kg)  BMI 36.61 kg/m2  SpO2 97%  LMP 12/05/2014 Physical Exam  Constitutional: She is oriented to person, place, and time. She appears well-developed and well-nourished. No distress.  HENT:  Head: Normocephalic.  Eyes: Conjunctivae and EOM are normal.  Cardiovascular: Regular rhythm and intact distal pulses.   Tachycardic  Pulmonary/Chest: Effort normal and breath sounds normal. No stridor. No respiratory distress. She has no wheezes. She has no rales. She exhibits no tenderness.  Abdominal: Bowel sounds are normal. She exhibits no distension and no mass. There is tenderness. There is no rebound and no guarding.  Mild tenderness palpation left lower quadrant with no guarding or rebound  Genitourinary:     Pelvic exam chaperoned by nurse: No rashes, patient has fluctuant abscess to right labia majora as diagrammed. No active drainage, no significant surrounding cellulitis.  Scant, white, homogenous, foul-smelling vaginal discharge. No cervical motion tenderness, right adnexal and left adnexal tenderness.  Musculoskeletal: Normal range of motion.  Neurological: She is alert and oriented to person, place, and time.  Psychiatric: She has a normal mood and affect.  Nursing note and vitals reviewed.   ED Course  .Marland KitchenIncision and Drainage Date/Time: 12/18/2014 9:21 PM Performed by: Monico Blitz Authorized by: Monico Blitz Consent: Verbal consent obtained. Consent given by: patient Required items: required blood products, implants, devices, and special equipment available Patient identity confirmed: verbally with patient Type: abscess Body area: anogenital Anesthesia: local infiltration Local anesthetic: lidocaine 1% without epinephrine Anesthetic total: 0.5 ml Patient sedated: no Scalpel size: 11 Incision type: single straight Incision depth:  dermal Complexity: complex Drainage: purulent and  bloody Drainage amount: moderate Wound treatment: wound left open Packing material: none Patient tolerance: Patient tolerated the procedure well with no immediate complications   (including critical care time)   Labs Review Labs Reviewed  CBC WITH DIFFERENTIAL/PLATELET - Abnormal; Notable for the following:    Lymphs Abs 0.6 (*)    All other components within normal limits  COMPREHENSIVE METABOLIC PANEL - Abnormal; Notable for the following:    Sodium 131 (*)    Chloride 100 (*)    Glucose, Bld 296 (*)    Calcium 8.5 (*)    All other components within normal limits  D-DIMER, QUANTITATIVE (NOT AT [facility]) - Abnormal; Notable for the following:    D-Dimer, Quant 0.86 (*)    All other components within normal limits  I-STAT CG4 LACTIC ACID, ED - Abnormal; Notable for the following:    Lactic Acid, Venous 2.46 (*)    All other components within normal limits  WET PREP, GENITAL  LIPASE, BLOOD  TROPONIN I  URINALYSIS, ROUTINE W REFLEX MICROSCOPIC (  NOT AT [facility])  PREGNANCY, URINE  HIV ANTIBODY (ROUTINE TESTING)  RPR  GC/CHLAMYDIA PROBE AMP ([place]) NOT AT [facility]    Imaging Review US Transvaginal Non-ob  12/18/2014  CLINICAL DATA:  Acute onset of left lower quadrant abdominal pain, nausea and vomiting. EXAM: TRANSABDOMINAL AND TRANSVAGINAL ULTRASOUND OF PELVIS DOPPLER ULTRASOUND OF OVARIES TECHNIQUE: Both transabdominal and transvaginal ultrasound examinations of the pelvis were performed. Transabdominal technique was performed for global imaging of the pelvis including uterus, ovaries, adnexal regions, and pelvic cul-de-sac. It was necessary to proceed with endovaginal exam following the transabdominal exam to visualize the uterus and ovaries in greater detail. Color and duplex Doppler ultrasound was utilized to evaluate blood flow to the ovaries. COMPARISON:  CT of the abdomen and pelvis performed 02/27/2013, and pelvic ultrasound  performed 04/08/2012 FINDINGS: Uterus Measurements: 11.2 x 5.2 x 6.0 cm. A 1.4 cm intramural fibroid is noted at the left uterine fundus. Endometrium Thickness: 1.2 cm.  No focal abnormality visualized. Right ovary Measurements: 5.8 x 3.8 x 5.4 cm. A large anechoic cyst is noted at the right ovary, measuring 6.1 x 4.9 x 4.4 cm. Left ovary Measurements: 4.4 x 3.4 x 4.3 cm. Heterogeneous in appearance; no associated blood flow is visualized on Doppler evaluation. Pulsed Doppler evaluation of the right ovary demonstrates normal low-resistance arterial and venous waveforms. No blood flow is seen with regard to the left ovary. Other findings Trace free fluid is seen within the pelvic cul-de-sac. IMPRESSION: 1. No blood flow noted with regard to the left ovary, suspicious for left ovarian torsion. The patient has associated severe left adnexal tenderness. 2. Large anechoic right ovarian cyst noted, measuring 6.1 cm. Visualized right ovarian tissue is grossly unremarkable. 3. Small uterine fibroid noted.  Uterus otherwise unremarkable. Critical Value/emergent results were called by telephone at the time of interpretation on 12/18/2014 at 9:06 pm to Dr. Tomi Bamberger, who verbally acknowledged these results. Electronically Signed   By: Garald Balding M.D.   On: 12/18/2014 21:12   US Pelvis Complete  12/18/2014  CLINICAL DATA:  Acute onset of left lower quadrant abdominal pain, nausea and vomiting. EXAM: TRANSABDOMINAL AND TRANSVAGINAL ULTRASOUND OF PELVIS DOPPLER ULTRASOUND OF OVARIES TECHNIQUE: Both transabdominal and transvaginal ultrasound examinations of the pelvis were performed. Transabdominal technique was performed for global imaging of the pelvis including uterus, ovaries, adnexal regions, and pelvic cul-de-sac. It was necessary to proceed with endovaginal exam following the transabdominal exam to visualize the uterus and ovaries in greater detail. Color and duplex Doppler ultrasound was utilized to evaluate blood flow  to the ovaries. COMPARISON:  CT of the abdomen and pelvis performed 02/27/2013, and pelvic ultrasound performed 04/08/2012 FINDINGS: Uterus Measurements: 11.2 x 5.2 x 6.0 cm. A 1.4 cm intramural fibroid is noted at the left uterine fundus. Endometrium Thickness: 1.2 cm.  No focal abnormality visualized. Right ovary Measurements: 5.8 x 3.8 x 5.4 cm. A large anechoic cyst is noted at the right ovary, measuring 6.1 x 4.9 x 4.4 cm. Left ovary Measurements: 4.4 x 3.4 x 4.3 cm. Heterogeneous in appearance; no associated blood flow is visualized on Doppler evaluation. Pulsed Doppler evaluation of the right ovary demonstrates normal low-resistance arterial and venous waveforms. No blood flow is seen with regard to the left ovary. Other findings Trace free fluid is seen within the pelvic cul-de-sac. IMPRESSION: 1. No blood flow noted with regard to the left ovary, suspicious for left ovarian torsion. The patient has associated severe left adnexal tenderness. 2. Large anechoic right ovarian cyst  noted, measuring 6.1 cm. Visualized right ovarian tissue is grossly unremarkable. 3. Small uterine fibroid noted.  Uterus otherwise unremarkable. Critical Value/emergent results were called by telephone at the time of interpretation on 12/18/2014 at 9:06 pm to Dr. Tomi Bamberger, who verbally acknowledged these results. Electronically Signed   By: Garald Balding M.D.   On: 12/18/2014 21:12   [place] Art/ven Flow Abd Pelv Doppler  12/18/2014  CLINICAL DATA:  Acute onset of left lower quadrant abdominal pain, nausea and vomiting. EXAM: TRANSABDOMINAL AND TRANSVAGINAL ULTRASOUND OF PELVIS DOPPLER ULTRASOUND OF OVARIES TECHNIQUE: Both transabdominal and transvaginal ultrasound examinations of the pelvis were performed. Transabdominal technique was performed for global imaging of the pelvis including uterus, ovaries, adnexal regions, and pelvic cul-de-sac. It was necessary to proceed with endovaginal exam following the transabdominal exam to  visualize the uterus and ovaries in greater detail. Color and duplex Doppler ultrasound was utilized to evaluate blood flow to the ovaries. COMPARISON:  CT of the abdomen and pelvis performed 02/27/2013, and pelvic ultrasound performed 04/08/2012 FINDINGS: Uterus Measurements: 11.2 x 5.2 x 6.0 cm. A 1.4 cm intramural fibroid is noted at the left uterine fundus. Endometrium Thickness: 1.2 cm.  No focal abnormality visualized. Right ovary Measurements: 5.8 x 3.8 x 5.4 cm. A large anechoic cyst is noted at the right ovary, measuring 6.1 x 4.9 x 4.4 cm. Left ovary Measurements: 4.4 x 3.4 x 4.3 cm. Heterogeneous in appearance; no associated blood flow is visualized on Doppler evaluation. Pulsed Doppler evaluation of the right ovary demonstrates normal low-resistance arterial and venous waveforms. No blood flow is seen with regard to the left ovary. Other findings Trace free fluid is seen within the pelvic cul-de-sac. IMPRESSION: 1. No blood flow noted with regard to the left ovary, suspicious for left ovarian torsion. The patient has associated severe left adnexal tenderness. 2. Large anechoic right ovarian cyst noted, measuring 6.1 cm. Visualized right ovarian tissue is grossly unremarkable. 3. Small uterine fibroid noted.  Uterus otherwise unremarkable. Critical Value/emergent results were called by telephone at the time of interpretation on 12/18/2014 at 9:06 pm to Dr. Tomi Bamberger, who verbally acknowledged these results. Electronically Signed   By: Garald Balding M.D.   On: 12/18/2014 21:12   I have personally reviewed and evaluated these images and lab results as part of my medical decision-making.   EKG Interpretation   Date/Time:  Saturday December 18 2014 19:57:05 EST Ventricular Rate:  125 PR Interval:  126 QRS Duration: 68 QT Interval:  306 QTC Calculation: 441 R Axis:   0 Text Interpretation:  Sinus tachycardia Low voltage QRS Cannot rule out  Anterior infarct , age undetermined Abnormal ECG Since  last tracing rate  faster Confirmed by KNAPP  MD-J, JON [id number]) on 12/18/2014 8:29:15 PM      MDM   Final diagnoses:  LLQ pain    Filed Vitals:   12/18/14 1903 12/18/14 2005  BP: 142/87 139/95  Pulse: 124 123  Temp: 99.9 F (37.7 C)   TempSrc: Oral   Resp: 20 17  Height: 5\' 5"  (1.651 m)   Weight: 220 lb (99.791 kg)   SpO2: 98% 97%    Medications  morphine 4 MG/ML injection 4 mg (4 mg Intravenous Given 12/18/14 2013)  lidocaine (PF) (XYLOCAINE) 1 % injection (not administered)  LORazepam (ATIVAN) injection 1 mg (not administered)  sodium chloride 0.9 % bolus 1,000 mL (not administered)  metFORMIN (GLUCOPHAGE) tablet 1,000 mg (1,000 mg Oral Given 12/18/14 2013)  sodium chloride 0.9 % bolus 1,000  mL (1,000 mLs Intravenous New Bag/Given 12/18/14 2015)  ondansetron (ZOFRAN) injection 4 mg (4 mg Intravenous Given 12/18/14 2013)    Holly Wells is 44 y.o. female presenting with chills, fatigue, shortness of breath, left lower quadrant abdominal pain with no GU, GI or urinary symptoms. Abdominal exam is nonsurgical. Patient is tachycardic, lung sounds are clear to auscultation. She is saturating well on room air. No leukocytosis over patient's lactic acid is elevated at 2.46. Considering her tachycardia and shortness of breath time or as ordered which is elevated.  Incision performed performed on small perineal abscess.  Critical result of left ovarian torsion from radiology appreciated. Patient advised to remain nothing by mouth and advised of plan to transfer for likely surgery. Patient has agitated and upset, attempted verbal de-escalation. Patient given Ativan 1 mg IV.  OB/GYN consult from Dr. Sabra Heck appreciated: She has looked at the ultrasound reading is concerned that although this patient has a very large right adnexal cyst, the left-side has no cyst yet shows torsion which is atypical. She's discussed findings with radiologist Dr. Radene Knee, she accepts transfer to [facility]  hospital where she will repeat ultrasound. Considering this patient's tachycardia and elevated d-dimer we will obtain CT to rule out PE before this patient is transferred. She is hemodynamically stable for transfer. Will have cardiac monitoring en route  CT  negative for PE. Patient is been received at [facility] hospital.   Monico Blitz, PA-C 12/18/14 2357  Dorie Rank, MD 12/19/14 1537

## 2014-12-18 NOTE — MAU Note (Addendum)
Pt sent from [facility] with possible Torsion of hr left ovary.Pt comfortable right now. Given MS around 8pm. Dr. Sabra Heck at bedside.

## 2014-12-18 NOTE — ED Notes (Signed)
Report given to Nelida Gores at [facility].

## 2014-12-18 NOTE — H&P (Addendum)
Holly Wells is an 44 y.o. female G2P2 AAF who was transferred from [place] with diagnosis of possible left ovarian torsion, LLQ pain, diabetes, nausea, and weakness.    Pt reports she hasn't felt "great" all day.  This afternoon, while she was at the mall with her daughter, she started feeling weak and clammy.  She thought maybe she was coming down with the flu and decided to go to [place].  She was having some LLQ pain as well but she has chronic pelvic pain and she reports it didn't really worsen until she was at the ER.  She's felt anorexia all day and some mild nausea.  She has not had any emesis.  She had a normal BM yesterday.  She states she had some SOB earlier but this has resolved.  Pt reports her pain is about a six at time of visit.  Pt states she has chronic pain that can be as low as a two but as high as a 10.  She also reports she has pain before every bowel movement that is low across her abdomen.  She had a normal bowel movement yesterday.  Has passed flatus today.    Pt had complex abdominal/gynecological history due to pelvic abscess requiring surgery in 2007.  I do not have an operative note to review but have reviewed every office note of Dr. Zelphia Cairo that is available.  It looks like the pt had a pelvic/abdominal wall abscess that required a laparotomy with appendectomy/partial bowel resection/partial removal of the stomach.  (Some of this is obtained via notes in EPIC and some by her hx).  Pt reports that the incision was left open and healed "on it's own" with dressing changes.  She was hospitalized for several weeks for this.  She had a chronic right ovarian cyst about 5cm in size.  This has been seen several times.  She saw Dr. Burke Keels at [facility] for consultation about possible surgery several years ago.  She was advised to live with the pain and manage it but not have another surgery if possible.  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  She had a small vulvar abscess I&D'ed at [facility] earlier this evening.  Pt is not sure about her last HbA1C.  Sees provider at [facility] clinic.  Care Everywhere records reviewed.  No recent notes available.  In ER, D-dimer 0.86.  CT chest negative for PE.  Lactic Acid 2.45.  BS 296.  Pertinent Gynecological History: Menses: regular every 28-30 days without intermenstrual spotting Bleeding: heavy for three to four days, then lightens Contraception: none DES exposure: denies Blood transfusions: none Previous GYN Procedures: none except ultrasounds  Last mammogram: normal Date: 10/16 Last pap: normal Date: 4/15 OB History: G2, P2   Menstrual History: Patient's last menstrual period was 12/05/2014.    Past Medical History  Diagnosis Date  . Diabetes mellitus   . Hypertension   . Anxiety   . Fibroid tumor   . 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 Ovarian cyst   . Kidney stone   . Seasonal allergies     Past Surgical History  Procedure Laterality Date  . Cholecystectomy    . Appendectomy    . Tonsillectomy    . Stomach and bowel surg    . Exploratory laparotomy w/ bowel resection      Family History  Problem Relation Age of Onset  . Hypertension Mother   . Thyroid cancer Mother   . Diabetes Maternal Grandmother   . Hypertension Maternal Grandmother   . Breast cancer Maternal Grandmother 78  . Diabetes Maternal Grandfather   . Hypertension Maternal Grandfather   . Deep vein thrombosis Daughter     Social History:  reports that she has quit smoking. She has never used smokeless tobacco. She reports that she drinks alcohol. She reports that she does not use illicit drugs.  Allergies: No Known Allergies  Prescriptions prior to admission  Medication Sig Dispense Refill Last Dose  . lisinopril-hydrochlorothiazide (PRINZIDE,ZESTORETIC) 20-25 MG per tablet Take 1 tablet by mouth daily.     11/18/2014 at Unknown time  .  metFORMIN (GLUCOPHAGE) 1000 MG tablet Take 1,000 mg by mouth 2 (two) times daily.     11/18/2014 at Unknown time  . MORPHINE SULFATE PO Take by mouth.   More than a month at Unknown time  . Multiple Vitamin (MULTIVITAMIN) tablet Take 1 tablet by mouth daily. CENTRUM    11/18/2014 at Unknown time  . traMADol (ULTRAM) 50 MG tablet Take 1 tablet (50 mg total) by mouth every 6 (six) hours as needed. 30 tablet 1   . doxycycline (VIBRAMYCIN) 100 MG capsule Take 1 capsule (100 mg total) by mouth 2 (two) times daily. One po bid x 7 days 14 capsule 0 Taking  . fluticasone (FLONASE) 50 MCG/ACT nasal spray Place 2 sprays into the nose daily. 16 g 0 Taking  . fluticasone (FLONASE) 50 MCG/ACT nasal spray Place 2 sprays into both nostrils daily. 16 g 0   . HYDROcodone-acetaminophen (NORCO/VICODIN) 5-325 MG per tablet Take 1-2 tablets by mouth every 4 (four) hours as needed. 15 tablet 0 11/17/2014 at Unknown time  . meloxicam (MOBIC) 7.5 MG tablet Take 1 tablet (7.5 mg total) by mouth daily. 7 tablet 0 Taking  . meloxicam (MOBIC) 7.5 MG tablet Take 1 tablet (7.5 mg total) by mouth daily. 7 tablet 0   . oxyCODONE (ROXICODONE) 5 MG immediate release tablet Take 1 tablet (5 mg total) by mouth every 4 (four) hours as needed for severe pain. 7 tablet 0     Review of Systems  Constitutional: Positive for malaise/fatigue. Negative for fever and chills.  HENT: Negative.   Respiratory: Positive for shortness of breath (which has improved since she was initially seen in ER at [place]).   Cardiovascular: Negative.   Gastrointestinal: Positive for nausea and abdominal pain (LLQ). Negative for vomiting, diarrhea, constipation and blood in stool.  Genitourinary: Negative.   Musculoskeletal: Negative.   Skin: Negative.   Neurological: Positive for weakness. Negative for dizziness, tingling, seizures and loss of consciousness.  Endo/Heme/Allergies: Negative for polydipsia.  Psychiatric/Behavioral: Negative.      Blood pressure 114/83, pulse 104, temperature 99 F (37.2 C), temperature source Oral, resp. rate 18, height 5\' 5"  (1.651 m), weight 220 lb (99.791 kg), last menstrual period 12/05/2014, SpO2 99 %. Physical Exam  Constitutional: She is oriented to person, place, and time. She appears well-developed and well-nourished.  HENT:  Head: Normocephalic and atraumatic.  Neck: Normal range of motion. Neck supple. No tracheal deviation present. No thyromegaly present.  Cardiovascular: Regular rhythm and normal heart sounds.  Tachycardia present.   Pulses:      Dorsalis pedis pulses are 2+ on the right side, and 2+ on the left side.  Respiratory: Effort normal and breath sounds normal. No respiratory distress. She has no wheezes.  She has no rales. She exhibits no tenderness.  GI: Soft. Bowel sounds are normal. She exhibits no distension and no mass. There is tenderness (mild LLQ). There is no rebound and no guarding. A hernia is present. Hernia confirmed positive in the left inguinal area. Hernia confirmed negative in the right inguinal area.  Genitourinary: Uterus normal. Rectal exam shows tenderness.    There is no rash, tenderness, lesion or injury on the right labia. There is no rash, tenderness, lesion or injury on the left labia. Cervix exhibits no motion tenderness, no discharge and no friability. Right adnexum displays no mass, no tenderness and no fullness. Left adnexum displays no mass, no tenderness and no fullness. No erythema, tenderness or bleeding in the vagina. No foreign body around the vagina. No signs of injury around the vagina. Vaginal discharge (whitish) found.  Musculoskeletal: Normal range of motion. She exhibits no edema or tenderness.  Lymphadenopathy:    She has no cervical adenopathy.       Right: No inguinal adenopathy present.       Left: No inguinal adenopathy present.  Neurological: She is alert and oriented to person, place, and time.  Skin: Skin is warm and dry.   Psychiatric: She has a normal mood and affect. Her behavior is normal. Judgment normal.    Results for orders placed or performed during the hospital encounter of 12/18/14 (from the past 24 hour(s))  CBC with Differential     Status: Abnormal   Collection Time: 12/18/14  8:05 PM  Result Value Ref Range   WBC 5.1 4.0 - 10.5 K/uL   RBC 4.51 3.87 - 5.11 MIL/uL   Hemoglobin 12.3 12.0 - 15.0 g/dL   HCT 36.7 36.0 - 46.0 %   MCV 81.4 78.0 - 100.0 fL   MCH 27.3 26.0 - 34.0 pg   MCHC 33.5 30.0 - 36.0 g/dL   RDW 12.8 11.5 - 15.5 %   Platelets 326 150 - 400 K/uL   Neutrophils Relative % 82 %   Neutro Abs 4.2 1.7 - 7.7 K/uL   Lymphocytes Relative 12 %   Lymphs Abs 0.6 (L) 0.7 - 4.0 K/uL   Monocytes Relative 5 %   Monocytes Absolute 0.3 0.1 - 1.0 K/uL   Eosinophils Relative 1 %   Eosinophils Absolute 0.1 0.0 - 0.7 K/uL   Basophils Relative 0 %   Basophils Absolute 0.0 0.0 - 0.1 K/uL  Comprehensive metabolic panel     Status: Abnormal   Collection Time: 12/18/14  8:05 PM  Result Value Ref Range   Sodium 131 (L) 135 - 145 mmol/L   Potassium 4.0 3.5 - 5.1 mmol/L   Chloride 100 (L) 101 - 111 mmol/L   CO2 22 22 - 32 mmol/L   Glucose, Bld 296 (H) 65 - 99 mg/dL   BUN 18 6 - 20 mg/dL   Creatinine, Ser 0.79 0.44 - 1.00 mg/dL   Calcium 8.5 (L) 8.9 - 10.3 mg/dL   Total Protein 7.4 6.5 - 8.1 g/dL   Albumin 3.6 3.5 - 5.0 g/dL   AST 22 15 - 41 U/L   ALT 18 14 - 54 U/L   Alkaline Phosphatase 72 38 - 126 U/L   Total Bilirubin 0.4 0.3 - 1.2 mg/dL   GFR calc non Af Amer >60 >60 mL/min   GFR calc Af Amer >60 >60 mL/min   Anion gap 9 5 - 15  Lipase, blood     Status: None   Collection Time: 12/18/14  8:05 PM  Result Value Ref Range   Lipase 44 11 - 51 U/L  D-dimer, quantitative (not at [facility])     Status: Abnormal   Collection Time: 12/18/14  8:05 PM  Result Value Ref Range   D-Dimer, Quant 0.86 (H) 0.00 - 0.48 ug/mL-FEU  Troponin I     Status: None   Collection Time: 12/18/14  8:05 PM  Result  Value Ref Range   Troponin I <0.03 <0.031 ng/mL  I-Stat CG4 Lactic Acid, ED     Status: Abnormal   Collection Time: 12/18/14  8:54 PM  Result Value Ref Range   Lactic Acid, Venous 2.46 (HH) 0.5 - 2.0 mmol/L   Comment NOTIFIED PHYSICIAN   Urinalysis, Routine w reflex microscopic (not at [facility])     Status: Abnormal   Collection Time: 12/18/14  9:15 PM  Result Value Ref Range   Color, Urine YELLOW YELLOW   APPearance CLEAR CLEAR   Specific Gravity, Urine 1.026 1.005 - 1.030   pH 5.0 5.0 - 8.0   Glucose, UA >1000 (A) NEGATIVE mg/dL   Hgb urine dipstick NEGATIVE NEGATIVE   Bilirubin Urine NEGATIVE NEGATIVE   Ketones, ur NEGATIVE NEGATIVE mg/dL   Protein, ur NEGATIVE NEGATIVE mg/dL   Urobilinogen, UA 0.2 0.0 - 1.0 mg/dL   Nitrite NEGATIVE NEGATIVE   Leukocytes, UA NEGATIVE NEGATIVE  Pregnancy, urine     Status: None   Collection Time: 12/18/14  9:15 PM  Result Value Ref Range   Preg Test, Ur NEGATIVE NEGATIVE  Wet prep, genital     Status: None   Collection Time: 12/18/14  9:15 PM  Result Value Ref Range   Yeast Wet Prep HPF POC NONE SEEN NONE SEEN   Trich, Wet Prep NONE SEEN NONE SEEN   Clue Cells Wet Prep HPF POC NONE SEEN NONE SEEN   WBC, Wet Prep HPF POC NONE SEEN NONE SEEN  Urine microscopic-add on     Status: Abnormal   Collection Time: 12/18/14  9:15 PM  Result Value Ref Range   Squamous Epithelial / LPF FEW (A) RARE   WBC, UA 0-2 <3 WBC/hpf   RBC / HPF 0-2 <3 RBC/hpf   Bacteria, UA MANY (A) RARE    Ct Angio Chest Pe W/cm &/or Wo Cm  12/18/2014  CLINICAL DATA:  Elevated D-dimer. EXAM: CT ANGIOGRAPHY CHEST WITH CONTRAST TECHNIQUE: Multidetector CT imaging of the chest was performed using the standard protocol during bolus administration of intravenous contrast. Multiplanar CT image reconstructions and MIPs were obtained to evaluate the vascular anatomy. CONTRAST:  150mL OMNIPAQUE IOHEXOL 350 MG/ML SOLN COMPARISON:  None. FINDINGS: THORACIC INLET/BODY WALL: 12 mm nodule in  the right thyroid gland. At this size, there are no strong recommendations for imaging follow-up. MEDIASTINUM: Normal heart size. No pericardial effusion. No acute vascular abnormality, including aortic dissection or evidence of pulmonary embolism. No adenopathy. LUNG WINDOWS: No consolidation.  No effusion.  No suspicious pulmonary nodule. UPPER ABDOMEN: Hepatic steatosis.  Cholecystectomy changes. OSSEOUS: No acute fracture.  No suspicious lytic or blastic lesions. Review of the MIP images confirms the above findings. IMPRESSION: 1. Negative for pulmonary embolism or other acute finding. 2. Hepatic steatosis. 3. 12 mm right thyroid nodule. Electronically Signed   By: Monte Fantasia M.D.   On: 12/18/2014 22:24   US Transvaginal Non-ob  12/18/2014  CLINICAL DATA:  Acute onset of left lower quadrant abdominal pain, nausea and vomiting. EXAM: TRANSABDOMINAL AND TRANSVAGINAL ULTRASOUND OF PELVIS DOPPLER ULTRASOUND OF OVARIES TECHNIQUE:  Both transabdominal and transvaginal ultrasound examinations of the pelvis were performed. Transabdominal technique was performed for global imaging of the pelvis including uterus, ovaries, adnexal regions, and pelvic cul-de-sac. It was necessary to proceed with endovaginal exam following the transabdominal exam to visualize the uterus and ovaries in greater detail. Color and duplex Doppler ultrasound was utilized to evaluate blood flow to the ovaries. COMPARISON:  CT of the abdomen and pelvis performed 02/27/2013, and pelvic ultrasound performed 04/08/2012 FINDINGS: Uterus Measurements: 11.2 x 5.2 x 6.0 cm. A 1.4 cm intramural fibroid is noted at the left uterine fundus. Endometrium Thickness: 1.2 cm.  No focal abnormality visualized. Right ovary Measurements: 5.8 x 3.8 x 5.4 cm. A large anechoic cyst is noted at the right ovary, measuring 6.1 x 4.9 x 4.4 cm. Left ovary Measurements: 4.4 x 3.4 x 4.3 cm. Heterogeneous in appearance; no associated blood flow is visualized on Doppler  evaluation. Pulsed Doppler evaluation of the right ovary demonstrates normal low-resistance arterial and venous waveforms. No blood flow is seen with regard to the left ovary. Other findings Trace free fluid is seen within the pelvic cul-de-sac. IMPRESSION: 1. No blood flow noted with regard to the left ovary, suspicious for left ovarian torsion. The patient has associated severe left adnexal tenderness. 2. Large anechoic right ovarian cyst noted, measuring 6.1 cm. Visualized right ovarian tissue is grossly unremarkable. 3. Small uterine fibroid noted.  Uterus otherwise unremarkable. Critical Value/emergent results were called by telephone at the time of interpretation on 12/18/2014 at 9:06 pm to Dr. Tomi Bamberger, who verbally acknowledged these results. Electronically Signed   By: Garald Balding M.D.   On: 12/18/2014 21:12   US Pelvis Complete  12/18/2014  CLINICAL DATA:  Acute onset of left lower quadrant abdominal pain, nausea and vomiting. EXAM: TRANSABDOMINAL AND TRANSVAGINAL ULTRASOUND OF PELVIS DOPPLER ULTRASOUND OF OVARIES TECHNIQUE: Both transabdominal and transvaginal ultrasound examinations of the pelvis were performed. Transabdominal technique was performed for global imaging of the pelvis including uterus, ovaries, adnexal regions, and pelvic cul-de-sac. It was necessary to proceed with endovaginal exam following the transabdominal exam to visualize the uterus and ovaries in greater detail. Color and duplex Doppler ultrasound was utilized to evaluate blood flow to the ovaries. COMPARISON:  CT of the abdomen and pelvis performed 02/27/2013, and pelvic ultrasound performed 04/08/2012 FINDINGS: Uterus Measurements: 11.2 x 5.2 x 6.0 cm. A 1.4 cm intramural fibroid is noted at the left uterine fundus. Endometrium Thickness: 1.2 cm.  No focal abnormality visualized. Right ovary Measurements: 5.8 x 3.8 x 5.4 cm. A large anechoic cyst is noted at the right ovary, measuring 6.1 x 4.9 x 4.4 cm. Left ovary  Measurements: 4.4 x 3.4 x 4.3 cm. Heterogeneous in appearance; no associated blood flow is visualized on Doppler evaluation. Pulsed Doppler evaluation of the right ovary demonstrates normal low-resistance arterial and venous waveforms. No blood flow is seen with regard to the left ovary. Other findings Trace free fluid is seen within the pelvic cul-de-sac. IMPRESSION: 1. No blood flow noted with regard to the left ovary, suspicious for left ovarian torsion. The patient has associated severe left adnexal tenderness. 2. Large anechoic right ovarian cyst noted, measuring 6.1 cm. Visualized right ovarian tissue is grossly unremarkable. 3. Small uterine fibroid noted.  Uterus otherwise unremarkable. Critical Value/emergent results were called by telephone at the time of interpretation on 12/18/2014 at 9:06 pm to Dr. Tomi Bamberger, who verbally acknowledged these results. Electronically Signed   By: Garald Balding M.D.   On: 12/18/2014 21:12  [place] Art/ven Flow Abd Pelv Doppler  12/18/2014  CLINICAL DATA:  Acute onset of left lower quadrant abdominal pain, nausea and vomiting. EXAM: TRANSABDOMINAL AND TRANSVAGINAL ULTRASOUND OF PELVIS DOPPLER ULTRASOUND OF OVARIES TECHNIQUE: Both transabdominal and transvaginal ultrasound examinations of the pelvis were performed. Transabdominal technique was performed for global imaging of the pelvis including uterus, ovaries, adnexal regions, and pelvic cul-de-sac. It was necessary to proceed with endovaginal exam following the transabdominal exam to visualize the uterus and ovaries in greater detail. Color and duplex Doppler ultrasound was utilized to evaluate blood flow to the ovaries. COMPARISON:  CT of the abdomen and pelvis performed 02/27/2013, and pelvic ultrasound performed 04/08/2012 FINDINGS: Uterus Measurements: 11.2 x 5.2 x 6.0 cm. A 1.4 cm intramural fibroid is noted at the left uterine fundus. Endometrium Thickness: 1.2 cm.  No focal abnormality visualized. Right ovary  Measurements: 5.8 x 3.8 x 5.4 cm. A large anechoic cyst is noted at the right ovary, measuring 6.1 x 4.9 x 4.4 cm. Left ovary Measurements: 4.4 x 3.4 x 4.3 cm. Heterogeneous in appearance; no associated blood flow is visualized on Doppler evaluation. Pulsed Doppler evaluation of the right ovary demonstrates normal low-resistance arterial and venous waveforms. No blood flow is seen with regard to the left ovary. Other findings Trace free fluid is seen within the pelvic cul-de-sac. IMPRESSION: 1. No blood flow noted with regard to the left ovary, suspicious for left ovarian torsion. The patient has associated severe left adnexal tenderness. 2. Large anechoic right ovarian cyst noted, measuring 6.1 cm. Visualized right ovarian tissue is grossly unremarkable. 3. Small uterine fibroid noted.  Uterus otherwise unremarkable. Critical Value/emergent results were called by telephone at the time of interpretation on 12/18/2014 at 9:06 pm to Dr. Tomi Bamberger, who verbally acknowledged these results. Electronically Signed   By: Garald Balding M.D.   On: 12/18/2014 21:12   Repeat PUS showed normal left blood flow and 4.2 x 3.2 x 3.2cm complex cyst consistent with hemorrhagic ovarian cyst.  Assessment/Plan: 1. Complex left ovarian cyst most consistent with hemorrhagic cyst.  Torsion not noted on repeat PUS.  Admit for pain control. 2.  Diabetes, poorly controlled.  Will start insulin and FSBS. 3.  Tachycardia.  Continue fluids and pain management. 4.  Mildly elevated lactic acid.  Repeat pending. 5.  Vulvar cellulitis, s/p I&D of abscess in ER.  Will treat with Bactrim DS bid x 7 days.   Hale Bogus SUZANNE 12/18/2014, 11:36 PM

## 2014-12-18 NOTE — ED Notes (Signed)
Pt has 2 left ovarian cysts- Reports LLQ abd pain today. Also c/o feeling cold, weak and nauseated

## 2014-12-19 ENCOUNTER — Encounter (HOSPITAL_COMMUNITY): Payer: Self-pay | Admitting: *Deleted

## 2014-12-19 DIAGNOSIS — N83202 Unspecified ovarian cyst, left side: Secondary | ICD-10-CM | POA: Diagnosis present

## 2014-12-19 DIAGNOSIS — R1032 Left lower quadrant pain: Secondary | ICD-10-CM | POA: Diagnosis present

## 2014-12-19 DIAGNOSIS — N764 Abscess of vulva: Secondary | ICD-10-CM | POA: Diagnosis present

## 2014-12-19 LAB — GLUCOSE, CAPILLARY
GLUCOSE-CAPILLARY: 224 mg/dL — AB (ref 65–99)
Glucose-Capillary: 199 mg/dL — ABNORMAL HIGH (ref 65–99)

## 2014-12-19 LAB — BASIC METABOLIC PANEL
ANION GAP: 7 (ref 5–15)
BUN: 14 mg/dL (ref 6–20)
CALCIUM: 7.7 mg/dL — AB (ref 8.9–10.3)
CHLORIDE: 102 mmol/L (ref 101–111)
CO2: 23 mmol/L (ref 22–32)
CREATININE: 0.71 mg/dL (ref 0.44–1.00)
GFR calc Af Amer: >60 mL/min (ref >60)
GFR calc non Af Amer: >60 mL/min (ref >60)
GLUCOSE: 207 mg/dL — AB (ref 65–99)
Potassium: 4 mmol/L (ref 3.5–5.1)
Sodium: 132 mmol/L — ABNORMAL LOW (ref 135–145)

## 2014-12-19 LAB — CBC
HCT: 33.4 % — ABNORMAL LOW (ref 36.0–46.0)
HEMOGLOBIN: 11.1 g/dL — AB (ref 12.0–15.0)
MCH: 27.3 pg (ref 26.0–34.0)
MCHC: 33.2 g/dL (ref 30.0–36.0)
MCV: 82.3 fL (ref 78.0–100.0)
Platelets: 281 10*3/uL (ref 150–400)
RBC: 4.06 MIL/uL (ref 3.87–5.11)
RDW: 13.4 % (ref 11.5–15.5)
WBC: 5.4 10*3/uL (ref 4.0–10.5)

## 2014-12-19 LAB — LACTIC ACID, PLASMA: LACTIC ACID, VENOUS: 1.5 mmol/L (ref 0.5–2.0)

## 2014-12-19 MED ORDER — HYDROMORPHONE HCL 2 MG PO TABS: 2.0000 mg | ORAL_TABLET | ORAL | Status: DC | PRN

## 2014-12-19 MED ORDER — TRAMADOL HCL 50 MG PO TABS
50.0000 mg | ORAL_TABLET | Freq: Four times a day (QID) | ORAL | Status: DC | PRN
  Administered 2014-12-19 (×2): 50 mg via ORAL
  Filled 2014-12-19 (×2): qty 1

## 2014-12-19 MED ORDER — SULFAMETHOXAZOLE-TRIMETHOPRIM 800-160 MG PO TABS
1.0000 | ORAL_TABLET | Freq: Two times a day (BID) | ORAL | Status: DC
  Administered 2014-12-19: 1 via ORAL
  Filled 2014-12-19: qty 1

## 2014-12-19 MED ORDER — DIPHENHYDRAMINE HCL 50 MG/ML IJ SOLN
25.0000 mg | Freq: Four times a day (QID) | INTRAMUSCULAR | Status: DC | PRN
  Administered 2014-12-19: 25 mg via INTRAVENOUS
  Filled 2014-12-19: qty 1

## 2014-12-19 MED ORDER — SIMETHICONE 80 MG PO CHEW: 80.0000 mg | CHEWABLE_TABLET | Freq: Four times a day (QID) | ORAL | Status: DC | PRN

## 2014-12-19 MED ORDER — DOXYCYCLINE HYCLATE 100 MG PO TABS: 100.0000 mg | ORAL_TABLET | Freq: Two times a day (BID) | ORAL | Status: DC

## 2014-12-19 MED ORDER — ONDANSETRON HCL 4 MG/2ML IJ SOLN: 4.0000 mg | Freq: Four times a day (QID) | INTRAMUSCULAR | Status: DC | PRN

## 2014-12-19 MED ORDER — HYDROMORPHONE HCL 2 MG PO TABS: 2.0000 mg | ORAL_TABLET | Freq: Four times a day (QID) | ORAL | Status: DC | PRN

## 2014-12-19 MED ORDER — DOXYCYCLINE HYCLATE 100 MG PO TABS
100.0000 mg | ORAL_TABLET | Freq: Two times a day (BID) | ORAL | Status: DC
  Administered 2014-12-19: 100 mg via ORAL
  Filled 2014-12-19 (×3): qty 1

## 2014-12-19 MED ORDER — METFORMIN HCL 500 MG PO TABS
1000.0000 mg | ORAL_TABLET | Freq: Two times a day (BID) | ORAL | Status: DC
  Administered 2014-12-19: 1000 mg via ORAL
  Filled 2014-12-19 (×4): qty 2

## 2014-12-19 MED ORDER — MENTHOL 3 MG MT LOZG: 1.0000 | LOZENGE | OROMUCOSAL | Status: DC | PRN

## 2014-12-19 MED ORDER — HYDROCHLOROTHIAZIDE 25 MG PO TABS
25.0000 mg | ORAL_TABLET | Freq: Every day | ORAL | Status: DC
  Administered 2014-12-19: 25 mg via ORAL
  Filled 2014-12-19: qty 1

## 2014-12-19 MED ORDER — FLUCONAZOLE 150 MG PO TABS
150.0000 mg | ORAL_TABLET | Freq: Once | ORAL | Status: AC
  Administered 2014-12-19: 150 mg via ORAL
  Filled 2014-12-19: qty 1

## 2014-12-19 MED ORDER — SODIUM CHLORIDE 0.9 % IV SOLN: INTRAVENOUS | Status: DC

## 2014-12-19 MED ORDER — ACETAMINOPHEN 325 MG PO TABS: 650.0000 mg | ORAL_TABLET | ORAL | Status: DC | PRN

## 2014-12-19 MED ORDER — ONDANSETRON HCL 4 MG PO TABS: 4.0000 mg | ORAL_TABLET | Freq: Four times a day (QID) | ORAL | Status: DC | PRN

## 2014-12-19 MED ORDER — TRAMADOL HCL 50 MG PO TABS: ORAL_TABLET | ORAL | Status: DC

## 2014-12-19 MED ORDER — OXYCODONE-ACETAMINOPHEN 5-325 MG PO TABS: 1.0000 | ORAL_TABLET | ORAL | Status: DC | PRN

## 2014-12-19 MED ORDER — LISINOPRIL 20 MG PO TABS
20.0000 mg | ORAL_TABLET | Freq: Every day | ORAL | Status: DC
  Administered 2014-12-19: 20 mg via ORAL
  Filled 2014-12-19 (×2): qty 1

## 2014-12-19 MED ORDER — INSULIN ASPART 100 UNIT/ML ~~LOC~~ SOLN
0.0000 [IU] | SUBCUTANEOUS | Status: DC
  Administered 2014-12-19: 2 [IU] via SUBCUTANEOUS
  Administered 2014-12-19: 3 [IU] via SUBCUTANEOUS

## 2014-12-19 MED ORDER — FLUCONAZOLE 150 MG PO TABS: ORAL_TABLET | ORAL | Status: DC

## 2014-12-19 NOTE — Discharge Instructions (Signed)
Ovarian Cyst An ovarian cyst is a fluid-filled sac that forms on an ovary. The ovaries are small organs that produce eggs in women. Various types of cysts can form on the ovaries. Most are not cancerous. Many do not cause problems, and they often go away on their own. Some may cause symptoms and require treatment. Common types of ovarian cysts include:  Functional cysts--These cysts may occur every month during the menstrual cycle. This is normal. The cysts usually go away with the next menstrual cycle if the woman does not get pregnant. Usually, there are no symptoms with a functional cyst.  Endometrioma cysts--These cysts form from the tissue that lines the uterus. They are also called "chocolate cysts" because they become filled with blood that turns brown. This type of cyst can cause pain in the lower abdomen during intercourse and with your menstrual period.  Cystadenoma cysts--This type develops from the cells on the outside of the ovary. These cysts can get very big and cause lower abdomen pain and pain with intercourse. This type of cyst can twist on itself, cut off its blood supply, and cause severe pain. It can also easily rupture and cause a lot of pain.  Dermoid cysts--This type of cyst is sometimes found in both ovaries. These cysts may contain different kinds of body tissue, such as skin, teeth, hair, or cartilage. They usually do not cause symptoms unless they get very big.  Theca lutein cysts--These cysts occur when too much of a certain hormone (human chorionic gonadotropin) is produced and overstimulates the ovaries to produce an egg. This is most common after procedures used to assist with the conception of a baby (in vitro fertilization). CAUSES   Fertility drugs can cause a condition in which multiple large cysts are formed on the ovaries. This is called ovarian hyperstimulation syndrome.  A condition called polycystic ovary syndrome can cause hormonal imbalances that can lead to  nonfunctional ovarian cysts. SIGNS AND SYMPTOMS  Many ovarian cysts do not cause symptoms. If symptoms are present, they may include:  Pelvic pain or pressure.  Pain in the lower abdomen.  Pain during sexual intercourse.  Increasing girth (swelling) of the abdomen.  Abnormal menstrual periods.  Increasing pain with menstrual periods.  Stopping having menstrual periods without being pregnant. DIAGNOSIS  These cysts are commonly found during a routine or annual pelvic exam. Tests may be ordered to find out more about the cyst. These tests may include:  Ultrasound.  X-ray of the pelvis.  CT scan.  MRI.  Blood tests. TREATMENT  Many ovarian cysts go away on their own without treatment. Your health care provider may want to check your cyst regularly for 2-3 months to see if it changes. For women in menopause, it is particularly important to monitor a cyst closely because of the higher rate of ovarian cancer in menopausal women. When treatment is needed, it may include any of the following:  A procedure to drain the cyst (aspiration). This may be done using a long needle and ultrasound. It can also be done through a laparoscopic procedure. This involves using a thin, lighted tube with a tiny camera on the end (laparoscope) inserted through a small incision.  Surgery to remove the whole cyst. This may be done using laparoscopic surgery or an open surgery involving a larger incision in the lower abdomen.  Hormone treatment or birth control pills. These methods are sometimes used to help dissolve a cyst. HOME CARE INSTRUCTIONS   Only take over-the-counter   or prescription medicines as directed by your health care provider.  Follow up with your health care provider as directed.  Get regular pelvic exams and Pap tests. SEEK MEDICAL CARE IF:   Your periods are late, irregular, or painful, or they stop.  Your pelvic pain or abdominal pain does not go away.  Your abdomen becomes  larger or swollen.  You have pressure on your bladder or trouble emptying your bladder completely.  You have pain during sexual intercourse.  You have feelings of fullness, pressure, or discomfort in your stomach.  You lose weight for no apparent reason.  You feel generally ill.  You become constipated.  You lose your appetite.  You develop acne.  You have an increase in body and facial hair.  You are gaining weight, without changing your exercise and eating habits.  You think you are pregnant. SEEK IMMEDIATE MEDICAL CARE IF:   You have increasing abdominal pain.  You feel sick to your stomach (nauseous), and you throw up (vomit).  You develop a fever that comes on suddenly.  You have abdominal pain during a bowel movement.  Your menstrual periods become heavier than usual. MAKE SURE YOU:  Understand these instructions.  Will watch your condition.  Will get help right away if you are not doing well or get worse.   This information is not intended to replace advice given to you by your health care provider. Make sure you discuss any questions you have with your health care provider.   Document Released: 01/22/2005 Document Revised: 01/27/2013 Document Reviewed: 09/29/2012 Elsevier Interactive Patient Education 2016 [employer]  

## 2014-12-19 NOTE — Progress Notes (Signed)
Subjective: Patient reports she is feeling much better.  Had episode of diarrhea earlier this morning and this really helped her pain.  Has last received a Tramadol at 8 am (almost four hours ago) and pain is now at a level 3.  Baseline for her is around a 2 as she does have some baseline pelvic pain all the time.      Objective: I have reviewed patient's vital signs, intake and output, medications and labs. Current pulse 95 taken in room with pt.  Pulse ox: 99% RA.  General: alert and cooperative Resp: clear to auscultation bilaterally Cardio: regular rate and rhythm, S1, S2 normal, no murmur, click, rub or gallop GI: normal findings: soft, ND, Normal BS in all quadrants, decreased tenderness to palpation LLQ Extremities: extremities normal, atraumatic, no cyanosis or edema and SCDs on and functioning Vaginal Bleeding: none  GYN:  Area of induration on right labia majora from I&D in ER last night   Assessment/Plan: LLQ pain due to hemorrhagic left ovarian cyst in pt with complicated bowel surgery in 2007.  Pain much improved. Diabetes, poor control, Labial abscess s/p I&D.  Itching reaction to Bactrim.  On Doxycycline.  No culture done in ER. Hypertension.  Good control.   Elevated lactic acid that has normalized. Plan d/c and outpt follow up     [employer] 12/19/2014, 11:50 AM

## 2014-12-19 NOTE — Plan of Care (Signed)
Problem: Safety: Goal: Ability to remain free from injury will improve Outcome: Completed/Met Date Met:  12/19/14 Safety measures discussed with patient and to call for assist.  Problem: Health Behavior/Discharge Planning: Goal: Ability to manage health-related needs will improve Outcome: Completed/Met Date Met:  12/19/14 Patient would like to have information on a living will.  Problem: Pain Managment: Goal: General experience of comfort will improve Outcome: Completed/Met Date Met:  12/19/14 Patient aware of kind of IV and po medications available to her,and that we will monitor her for her pain medication goal.  Problem: Physical Regulation: Goal: Ability to maintain clinical measurements within normal limits will improve Outcome: Completed/Met Date Met:  12/19/14 Patient already monitors her blood sugars and blood pressures and knows what is normal range.

## 2014-12-19 NOTE — Progress Notes (Signed)
Pt discharged to home with husband.  Condition stable.  Pt ambulated to car with RN.  No equipment for home ordered at discharge. 

## 2014-12-19 NOTE — Discharge Summary (Signed)
Physician Discharge Summary  Patient ID: Holly Wells MRN: [id number] DOB/AGE: August 14, 1970 44 y.o.  Admit date: 12/18/2014 Discharge date: 12/19/2014  Admission Diagnoses: LLQ pain, left hemorrhagic ovarian cyst, diabetes, hypertension, right labial abscess  Discharge Diagnoses:  Active Problems:   Left ovarian cyst   LLQ pain   Labial abscess  Discharged Condition: good  Hospital Course: pt was seen in [facility] ER yesterday due to weakness, nausea, and left lower quadrant pain.  Work up showed normal WBC ct, mildly elevated lactic acid at 2.46,  Elevated BS at 296, minimally elevated d dimer.  Ultrasound was done showing 5cm simple right cyst (which is chronically present) and torsion of left ovary.  CT of chest was negative for PE.  Pt transferred to [facility] for additional evaluation.  Although pt was in some pain, she did not have an acute, surgical abdomen and was much more comfortable than a typical person with ovarian torsion.  Radiology and I discussed ultrasound and decision made to repeat this at [facility] due to increased volume of these scans that are done here.  Hemorrhagic ovarian cyst with normal flow to ovary noted.  Pt did not appear to have any other signs of sepsis except elevated pulse.  EKG was done at med center [facility] as well showing sinus tach.  Pt was monitored for pain control and for repeating lactic acid level which normalized.  SS insulin was initiated and blood sugars improved.  Pt never had an elevated WBC ct or left shift.  Pt actually had best pain improvement with Tramadol and not narcotics.  In am of HD #2, pt reported episode of emesis and diarrhea at the same time which made her feel much better.  She did report she often has exacerbation of pain before a bowel movement.  VSS/AF.  Physical today with decreased tenderness in LLQ.  Feel she can be followed as an outpatient.  Consults: None  Significant Diagnostic Studies: labs: WBC ct normal.  Repeat  lactic acid 1.5.  BMD with BS 207.  Treatments: IV hydration and antibiotics: bactrim which was switched to doxycycline due to itching.  Discharge Exam: Blood pressure 115/74, pulse 108, temperature 98.5 F (36.9 C), temperature source Oral, resp. rate 20, height 5\' 5"  (1.651 m), weight 232 lb (105.235 kg), last menstrual period 12/05/2014, SpO2 98 %. General appearance: alert and cooperative Resp: clear to auscultation bilaterally Cardio: regular rate and rhythm, S1, S2 normal, no murmur, click, rub or gallop GI: normal findings: soft, ND, normal BS, decreased LLQ tenderness, no rebound or guarding Pelvic: right labia with area of induration around I&D of vulvar abscess.  No visible cellulitis today. Extremities: extremities normal, atraumatic, no cyanosis or edema  Disposition: 01-Home or Self Care     Medication List    STOP taking these medications        doxycycline 100 MG capsule  Commonly known as:  VIBRAMYCIN  Replaced by:  doxycycline 100 MG tablet     fluticasone 50 MCG/ACT nasal spray  Commonly known as:  FLONASE     meloxicam 7.5 MG tablet  Commonly known as:  MOBIC     multivitamin tablet     oxyCODONE 5 MG immediate release tablet  Commonly known as:  ROXICODONE      TAKE these medications        doxycycline 100 MG tablet  Commonly known as:  VIBRA-TABS  Take 1 tablet (100 mg total) by mouth 2 (two) times daily. Take  to complete 7 days     fluconazole 150 MG tablet  Commonly known as:  DIFLUCAN  Take one tablet 72 hours after first dosage     HYDROmorphone 2 MG tablet  Commonly known as:  DILAUDID  Take 1 tablet (2 mg total) by mouth every 6 (six) hours as needed.     lisinopril-hydrochlorothiazide 20-25 MG tablet  Commonly known as:  PRINZIDE,ZESTORETIC  Take 1 tablet by mouth daily.     metFORMIN 1000 MG tablet  Commonly known as:  GLUCOPHAGE  Take 1,000 mg by mouth 2 (two) times daily.     traMADol 50 MG tablet  Commonly known as:  ULTRAM   1-2 tabs every 6 hours prn moderate pain      ASK your doctor about these medications        HYDROcodone-acetaminophen 5-325 MG tablet  Commonly known as:  NORCO/VICODIN  Take 1-2 tablets by mouth every 4 (four) hours as needed.           Follow-up Information    Follow up with Anastasio Auerbach, MD In 2 weeks.   Specialties:  Gynecology, Radiology   Why:  Please call office if you are not called by [facility].  Will need repeat ultrasound.     Contact information:   [address] [place] [address] [phone]       Signed: Lyman Speller 12/19/2014, 12:04 PM

## 2014-12-20 LAB — GC/CHLAMYDIA PROBE AMP (~~LOC~~) NOT AT ARMC
Chlamydia: NEGATIVE
Neisseria Gonorrhea: NEGATIVE

## 2014-12-20 LAB — HIV ANTIBODY (ROUTINE TESTING W REFLEX): HIV Screen 4th Generation wRfx: NONREACTIVE

## 2014-12-20 LAB — RPR: RPR: NONREACTIVE

## 2014-12-21 ENCOUNTER — Telehealth: Payer: Self-pay | Admitting: *Deleted

## 2014-12-21 NOTE — Telephone Encounter (Signed)
Pt called asking if you would take a look at her ER note on 12/18/14. Pt has OV schedule with you on 01/05/15 meanwhile she would like your advice on any precautions if any? Please advise

## 2014-12-21 NOTE — Telephone Encounter (Signed)
Pt informed with the below note. 

## 2014-12-21 NOTE — Telephone Encounter (Signed)
She is asking about her activities I would let how she feels dictate her activities. If she feels okay then I would resume normal activities. Otherwise I would rest and slowly resume normal activities.

## 2015-01-05 ENCOUNTER — Encounter: Payer: Self-pay | Admitting: Gynecology

## 2015-01-05 ENCOUNTER — Ambulatory Visit (INDEPENDENT_AMBULATORY_CARE_PROVIDER_SITE_OTHER): Payer: Self-pay | Admitting: Gynecology

## 2015-01-05 VITALS — BP 122/80 | Ht 65.0 in | Wt 229.0 lb

## 2015-01-05 DIAGNOSIS — R102 Pelvic and perineal pain: Secondary | ICD-10-CM

## 2015-01-05 DIAGNOSIS — N83209 Unspecified ovarian cyst, unspecified side: Secondary | ICD-10-CM

## 2015-01-05 NOTE — Progress Notes (Signed)
Holly Wells October 14, 1970 [id number]        44 y.o.  [id number] Presents in follow up of recent hospitalization for pelvic pain. Was found to have persistence of her simple right ovarian cyst at 50 mm that we have been following over the past several years as well as a new 40-50 mm hemorrhagic appearing left ovarian cyst which was felt to be the etiology of her pain. Her pain has now resolved and she feels well. She has several questions as addressed below.  Past medical history,surgical history, problem list, medications, allergies, family history and social history were all reviewed and documented in the EPIC chart.  Directed ROS with pertinent positives and negatives documented in the history of present illness/assessment and plan.  Exam: Holly Wells assistant Filed Vitals:   01/05/15 1114  BP: 122/80  Height: 5\' 5"  (1.651 m)  Weight: 229 lb (103.874 kg)   General appearance:  Normal Abdomen soft nontender without masses guarding rebound Pelvic external BUS vagina normal. Cervix normal. Uterus grossly normal size difficult to palpate due to abdominal girth. No tenderness. Adnexa without gross masses or tenderness.  Assessment/Plan:  44 y.o. [id number] with:  1. History of persistent right ovarian cyst. 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 She's had a persistent simple appearing right ovarian cyst with normal CA-125's. Given the degree of adhesions described in her surgical note she was referred to [facility] for consideration of surgery and at that point they decided to continue expectant management weighing risks versus benefits of the surgery. She's done well over the last several years with occasional bouts of discomfort. Most recently was admitted with what appears to be a hemorrhagic cyst on the left and her pain now has resolved. Recommend  follow up ultrasound in January to relook at this area make sure it has resolved and she is going to schedule that in follow up for this. As far as the right ovarian cyst she's comfortable with continued expectant management. 2. Secondary infertility. She continues to try for pregnancy without success. 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 A follow up HSG shows a patent left tube and distal obstruction in the right tube. Recommend follow up with reproductive endocrinologist for further evaluation if she wants to pursue pregnancy. Arrangements to see Dr. Kerin Wells will be made at her request. 3. Diabetes, poor control. During her hospitalization her glucose levels were in the 200 range. She is on metformin. Does not feel that she is getting adequate care where she is being seen now will arrange for her to follow up with [facility] for active management of her diabetes.    Holly Auerbach MD, 11:42 AM 01/05/2015

## 2015-01-05 NOTE — Patient Instructions (Addendum)
Office will contact you in reference to arranging follow up with [facility] for diabetes management and with Dr. Kerin Perna reference to infertility.  Follow up in January for repeat ultrasound

## 2015-01-06 ENCOUNTER — Telehealth: Payer: Self-pay | Admitting: *Deleted

## 2015-01-06 DIAGNOSIS — E1310 Other specified diabetes mellitus with ketoacidosis without coma: Secondary | ICD-10-CM

## 2015-01-06 NOTE — Telephone Encounter (Signed)
-----   Message from Anastasio Auerbach, MD sent at 01/05/2015 11:39 AM EST ----- Patient needs referral:  #1 Dr. Kerin Perna reference infertility #2 [facility] reference diabetes management

## 2015-01-06 NOTE — Telephone Encounter (Signed)
Notes faxed to [place] office they will contact pt to schedule, referral placed for [facility] they will contact pt as well to schedule.

## 2015-01-25 NOTE — Telephone Encounter (Signed)
I called and spoke to [place] patient referral coordinator and she said pt told her she will call Dr.Y office in Jan to schedule appointment.

## 2015-02-28 ENCOUNTER — Encounter (HOSPITAL_BASED_OUTPATIENT_CLINIC_OR_DEPARTMENT_OTHER): Payer: Self-pay | Admitting: *Deleted

## 2015-02-28 ENCOUNTER — Emergency Department (HOSPITAL_BASED_OUTPATIENT_CLINIC_OR_DEPARTMENT_OTHER)
Admission: EM | Admit: 2015-02-28 | Discharge: 2015-02-28 | Disposition: A | Discharge status: Home / Self Care | Payer: Medicaid Other | Attending: Emergency Medicine | Admitting: Emergency Medicine

## 2015-02-28 DIAGNOSIS — Z8742 Personal history of other diseases of the female genital tract: Secondary | ICD-10-CM | POA: Insufficient documentation

## 2015-02-28 DIAGNOSIS — N390 Urinary tract infection, site not specified: Secondary | ICD-10-CM | POA: Insufficient documentation

## 2015-02-28 DIAGNOSIS — E119 Type 2 diabetes mellitus without complications: Secondary | ICD-10-CM | POA: Insufficient documentation

## 2015-02-28 DIAGNOSIS — Z79899 Other long term (current) drug therapy: Secondary | ICD-10-CM | POA: Insufficient documentation

## 2015-02-28 DIAGNOSIS — F419 Anxiety disorder, unspecified: Secondary | ICD-10-CM | POA: Insufficient documentation

## 2015-02-28 DIAGNOSIS — Z7984 Long term (current) use of oral hypoglycemic drugs: Secondary | ICD-10-CM | POA: Insufficient documentation

## 2015-02-28 DIAGNOSIS — Z87442 Personal history of urinary calculi: Secondary | ICD-10-CM | POA: Insufficient documentation

## 2015-02-28 DIAGNOSIS — I10 Essential (primary) hypertension: Secondary | ICD-10-CM | POA: Insufficient documentation

## 2015-02-28 DIAGNOSIS — Z86018 Personal history of other benign neoplasm: Secondary | ICD-10-CM | POA: Insufficient documentation

## 2015-02-28 DIAGNOSIS — Z87891 Personal history of nicotine dependence: Secondary | ICD-10-CM | POA: Insufficient documentation

## 2015-02-28 DIAGNOSIS — R197 Diarrhea, unspecified: Secondary | ICD-10-CM

## 2015-02-28 DIAGNOSIS — Z3202 Encounter for pregnancy test, result negative: Secondary | ICD-10-CM | POA: Insufficient documentation

## 2015-02-28 LAB — CBC WITH DIFFERENTIAL/PLATELET
BASOS ABS: 0 10*3/uL (ref 0.0–0.1)
Basophils Relative: 0 %
EOS PCT: 2 %
Eosinophils Absolute: 0.1 10*3/uL (ref 0.0–0.7)
HEMATOCRIT: 36.5 % (ref 36.0–46.0)
Hemoglobin: 12.1 g/dL (ref 12.0–15.0)
LYMPHS ABS: 2 10*3/uL (ref 0.7–4.0)
LYMPHS PCT: 26 %
MCH: 27.1 pg (ref 26.0–34.0)
MCHC: 33.2 g/dL (ref 30.0–36.0)
MCV: 81.7 fL (ref 78.0–100.0)
MONO ABS: 0.7 10*3/uL (ref 0.1–1.0)
MONOS PCT: 9 %
NEUTROS ABS: 4.9 10*3/uL (ref 1.7–7.7)
Neutrophils Relative %: 63 %
PLATELETS: 380 10*3/uL (ref 150–400)
RBC: 4.47 MIL/uL (ref 3.87–5.11)
RDW: 12.9 % (ref 11.5–15.5)
WBC: 7.8 10*3/uL (ref 4.0–10.5)

## 2015-02-28 LAB — URINALYSIS, ROUTINE W REFLEX MICROSCOPIC
GLUCOSE, UA: NEGATIVE mg/dL
Ketones, ur: 15 mg/dL — AB
Nitrite: POSITIVE — AB
PH: 5 (ref 5.0–8.0)
Protein, ur: 100 mg/dL — AB
Specific Gravity, Urine: 1.022 (ref 1.005–1.030)

## 2015-02-28 LAB — COMPREHENSIVE METABOLIC PANEL
ALT: 30 U/L (ref 14–54)
AST: 25 U/L (ref 15–41)
Albumin: 3.4 g/dL — ABNORMAL LOW (ref 3.5–5.0)
Alkaline Phosphatase: 78 U/L (ref 38–126)
Anion gap: 8 (ref 5–15)
BUN: 12 mg/dL (ref 6–20)
CHLORIDE: 107 mmol/L (ref 101–111)
CO2: 21 mmol/L — AB (ref 22–32)
CREATININE: 0.81 mg/dL (ref 0.44–1.00)
Calcium: 7.7 mg/dL — ABNORMAL LOW (ref 8.9–10.3)
GFR calc Af Amer: >60 mL/min (ref >60)
GFR calc non Af Amer: >60 mL/min (ref >60)
GLUCOSE: 183 mg/dL — AB (ref 65–99)
Potassium: 3.5 mmol/L (ref 3.5–5.1)
SODIUM: 136 mmol/L (ref 135–145)
Total Bilirubin: 0.5 mg/dL (ref 0.3–1.2)
Total Protein: 7 g/dL (ref 6.5–8.1)

## 2015-02-28 LAB — URINE MICROSCOPIC-ADD ON

## 2015-02-28 LAB — PREGNANCY, URINE: PREG TEST UR: NEGATIVE

## 2015-02-28 LAB — LIPASE, BLOOD: LIPASE: 21 U/L (ref 11–51)

## 2015-02-28 MED ORDER — SODIUM CHLORIDE 0.9 % IV BOLUS (SEPSIS)
1000.0000 mL | Freq: Once | INTRAVENOUS | Status: AC
Start: 2015-02-28 — End: 2015-02-28
  Administered 2015-02-28: 1000 mL via INTRAVENOUS

## 2015-02-28 MED ORDER — FOSFOMYCIN TROMETHAMINE 3 G PO PACK
3.0000 g | PACK | Freq: Once | ORAL | Status: AC
  Administered 2015-02-28: 3 g via ORAL
  Filled 2015-02-28: qty 3

## 2015-02-28 NOTE — ED Provider Notes (Signed)
CSN: [id number]     Arrival date & time 02/28/15  0645 History   First MD Initiated Contact with Patient 02/28/15 [phone]     Chief Complaint  Patient presents with  . Diarrhea     (Consider location/radiation/quality/duration/timing/severity/associated sxs/prior Treatment) Patient is a 45 y.o. female presenting with diarrhea. The history is provided by the patient.  Diarrhea Quality:  Watery Severity:  Severe Onset quality:  Gradual Duration:  1 week Timing:  Constant Progression:  Unchanged Relieved by:  Nothing Worsened by:  Nothing tried Ineffective treatments:  None tried Associated symptoms: abdominal pain (mild crampy) and fever (subjective)   Associated symptoms: no arthralgias, no chills, no headaches, no myalgias and no vomiting   Risk factors: no recent antibiotic use, no suspicious food intake and no travel to endemic areas     45 yo F with a chief complaint of diarrhea. Patient has had continuous symptoms for about a week. Some subjective fevers and chills yesterday. Patient denies any vomiting. 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 Patient denies any blood or dark stool. Patient has some mild crampy abdominal pain. Has felt dehydrated and weak over the past couple days.  Past Medical History  Diagnosis Date  . Diabetes mellitus   . Hypertension   . Anxiety   . Fibroid tumor   . 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 Ovarian cyst   . Kidney stone   . Seasonal allergies    Past Surgical History  Procedure Laterality Date  . Cholecystectomy    . Appendectomy    . Tonsillectomy    . Stomach and bowel surg    . Exploratory laparotomy w/ bowel resection     Family History  Problem Relation Age of Onset  . Hypertension Mother   . Thyroid cancer Mother   . Diabetes Maternal Grandmother   . Hypertension Maternal Grandmother   . Breast cancer Maternal Grandmother 78  . Diabetes Maternal Grandfather    . Hypertension Maternal Grandfather   . Deep vein thrombosis Daughter    Social History  Substance Use Topics  . Smoking status: Former Research scientist (life sciences)  . Smokeless tobacco: Never Used  . Alcohol Use: Yes     Comment: social   OB History    Gravida Para Term Preterm AB TAB SAB Ectopic Multiple Living   2 2 2       2      Review of Systems  Constitutional: Positive for fever (subjective). Negative for chills.  HENT: Negative for congestion and rhinorrhea.   Eyes: Negative for redness and visual disturbance.  Respiratory: Negative for shortness of breath and wheezing.   Cardiovascular: Negative for chest pain and palpitations.  Gastrointestinal: Positive for abdominal pain (mild crampy) and diarrhea. Negative for nausea and vomiting.  Genitourinary: Negative for dysuria and urgency.  Musculoskeletal: Negative for myalgias and arthralgias.  Skin: Negative for pallor and wound.  Neurological: Negative for dizziness and headaches.      Allergies  Bactrim  Home Medications   Prior to Admission medications   Medication Sig Start Date End Date Taking? Authorizing Provider  doxycycline (VIBRA-TABS) 100 MG tablet Take 1 tablet (100 mg total) by mouth 2 (two) times daily. Take to complete 7 days 12/19/14   Megan Salon, MD  fluconazole (DIFLUCAN) 150 MG tablet Take one tablet 72 hours after first dosage 12/19/14   Megan Salon, MD  lisinopril-hydrochlorothiazide [facility])  20-25 MG per tablet Take 1 tablet by mouth daily.      Historical Provider, MD  metFORMIN (GLUCOPHAGE) 1000 MG tablet Take 1,000 mg by mouth 2 (two) times daily.      Historical Provider, MD  traMADol (ULTRAM) 50 MG tablet 1-2 tabs every 6 hours prn moderate pain 12/19/14   Megan Salon, MD   BP 101/76 mmHg  Pulse 84  Temp(Src) 98 F (36.7 C)  Resp 20  Ht 5\' 4"  (1.626 m)  Wt 220 lb (99.791 kg)  BMI 37.74 kg/m2  SpO2 100%  LMP 02/28/2015 (Exact Date) Physical Exam  Constitutional: She is oriented to  person, place, and time. She appears well-developed and well-nourished. No distress.  HENT:  Head: Normocephalic and atraumatic.  Eyes: EOM are normal. Pupils are equal, round, and reactive to light.  Neck: Normal range of motion. Neck supple.  Cardiovascular: Normal rate and regular rhythm.  Exam reveals no gallop and no friction rub.   No murmur heard. Pulmonary/Chest: Effort normal. She has no wheezes. She has no rales.  Abdominal: Soft. She exhibits no distension. There is tenderness (very mild epigastric). There is no rebound and no guarding.  Musculoskeletal: She exhibits no edema or tenderness.  Neurological: She is alert and oriented to person, place, and time.  Skin: Skin is warm and dry. She is not diaphoretic.  Psychiatric: She has a normal mood and affect. Her behavior is normal.  Nursing note and vitals reviewed.   ED Course  Procedures (including critical care time) Labs Review Labs Reviewed  URINALYSIS, ROUTINE W REFLEX MICROSCOPIC (NOT AT [facility]) - Abnormal; Notable for the following:    Color, Urine RED (*)    APPearance CLOUDY (*)    Hgb urine dipstick LARGE (*)    Bilirubin Urine MODERATE (*)    Ketones, ur 15 (*)    Protein, ur 100 (*)    Nitrite POSITIVE (*)    Leukocytes, UA SMALL (*)    All other components within normal limits  COMPREHENSIVE METABOLIC PANEL - Abnormal; Notable for the following:    CO2 21 (*)    Glucose, Bld 183 (*)    Calcium 7.7 (*)    Albumin 3.4 (*)    All other components within normal limits  URINE MICROSCOPIC-ADD ON - Abnormal; Notable for the following:    Squamous Epithelial / LPF 6-30 (*)    Bacteria, UA MANY (*)    All other components within normal limits  PREGNANCY, URINE  CBC WITH DIFFERENTIAL/PLATELET  LIPASE, BLOOD    Imaging Review No results found. I have personally reviewed and evaluated these images and lab results as part of my medical decision-making.   EKG Interpretation None      MDM   Final  diagnoses:  UTI (lower urinary tract infection)  Diarrhea, unspecified type    45 yo F with a chief complaint of diarrhea. Due to length will obtain stool sample if possible. Check electrolytes given a bolus of IV fluids. Patient is afebrile here nontoxic and nonfocal abdominal exam. Feel safe to take Imodium. PCP follow up.   9:56 AM:  I have discussed the diagnosis/risks/treatment options with the patient and family and believe the pt to be eligible for discharge home to follow-up with PCP. We also discussed returning to the ED immediately if new or worsening sx occur. We discussed the sx which are most concerning (e.g., sudden worsening pain, fever, inability to tolerate by mouth) that necessitate immediate return. Medications administered  to the patient during their visit and any new prescriptions provided to the patient are listed below.  Medications given during this visit Medications  sodium chloride 0.9 % bolus 1,000 mL (0 mLs Intravenous Stopped 02/28/15 0930)  fosfomycin (MONUROL) packet 3 g (3 g Oral Given 02/28/15 0945)    New Prescriptions   No medications on file    The patient appears reasonably screen and/or stabilized for discharge and I doubt any other medical condition or other [facility] requiring further screening, evaluation, or treatment in the ED at this time prior to discharge.    Deno Etienne, DO 02/28/15 [phone]

## 2015-02-28 NOTE — ED Notes (Signed)
MD at bedside. 

## 2015-02-28 NOTE — Discharge Instructions (Signed)

## 2015-02-28 NOTE — ED Notes (Signed)
C/o diarrhea that started on Thursday. C/o nausea. Denies vomiting. Mild fever per patient yesterday. C/o abd feeling sore. Denies any urinary symptoms. Has been drinking fluids but states she feels weak.

## 2015-03-15 ENCOUNTER — Ambulatory Visit: Payer: Medicaid Other | Admitting: *Deleted

## 2015-04-05 ENCOUNTER — Ambulatory Visit: Payer: Medicaid Other | Admitting: *Deleted

## 2015-08-23 ENCOUNTER — Encounter (HOSPITAL_BASED_OUTPATIENT_CLINIC_OR_DEPARTMENT_OTHER): Payer: Self-pay | Admitting: *Deleted

## 2015-08-23 ENCOUNTER — Emergency Department (HOSPITAL_BASED_OUTPATIENT_CLINIC_OR_DEPARTMENT_OTHER)
Admission: EM | Admit: 2015-08-23 | Discharge: 2015-08-24 | Disposition: A | Discharge status: Home / Self Care | Payer: 59 | Attending: Emergency Medicine | Admitting: Emergency Medicine

## 2015-08-23 DIAGNOSIS — W57XXXA Bitten or stung by nonvenomous insect and other nonvenomous arthropods, initial encounter: Secondary | ICD-10-CM

## 2015-08-23 DIAGNOSIS — Z79899 Other long term (current) drug therapy: Secondary | ICD-10-CM | POA: Diagnosis not present

## 2015-08-23 DIAGNOSIS — E119 Type 2 diabetes mellitus without complications: Secondary | ICD-10-CM | POA: Diagnosis not present

## 2015-08-23 DIAGNOSIS — Y999 Unspecified external cause status: Secondary | ICD-10-CM | POA: Diagnosis not present

## 2015-08-23 DIAGNOSIS — I10 Essential (primary) hypertension: Secondary | ICD-10-CM | POA: Diagnosis not present

## 2015-08-23 DIAGNOSIS — R21 Rash and other nonspecific skin eruption: Secondary | ICD-10-CM

## 2015-08-23 DIAGNOSIS — Y939 Activity, unspecified: Secondary | ICD-10-CM | POA: Insufficient documentation

## 2015-08-23 DIAGNOSIS — S20362A Insect bite (nonvenomous) of left front wall of thorax, initial encounter: Secondary | ICD-10-CM | POA: Diagnosis not present

## 2015-08-23 DIAGNOSIS — Y929 Unspecified place or not applicable: Secondary | ICD-10-CM | POA: Insufficient documentation

## 2015-08-23 DIAGNOSIS — Z7984 Long term (current) use of oral hypoglycemic drugs: Secondary | ICD-10-CM | POA: Insufficient documentation

## 2015-08-23 MED ORDER — DOXYCYCLINE HYCLATE 100 MG PO TABS
100.0000 mg | ORAL_TABLET | Freq: Once | ORAL | Status: AC
  Administered 2015-08-23: 100 mg via ORAL
  Filled 2015-08-23: qty 1

## 2015-08-23 MED ORDER — DOXYCYCLINE HYCLATE 100 MG PO CAPS: 100.0000 mg | ORAL_CAPSULE | Freq: Two times a day (BID) | ORAL | Status: DC

## 2015-08-23 NOTE — ED Notes (Signed)
Rash and itching to her chest since she removed a tick 4 weeks ago.

## 2015-08-23 NOTE — ED Provider Notes (Signed)
CSN: [id number]     Arrival date & time 08/23/15  2229 History  By signing my name below, I, Reola Mosher, attest that this documentation has been prepared under the direction and in the presence of Samantha Dowless, PA-C.  Electronically Signed: Reola Mosher, ED Scribe. 08/23/2015. 11:06 PM.   Chief Complaint  Patient presents with  . Rash   The history is provided by the patient. No language interpreter was used.   HPI Comments: Holly Wells is a 45 y.o. female with a PMHx of DM, HTN, and anxiety who presents to the Emergency Department complaining of gradual onset, gradually growing, constant, pruritic rash on her left upper chest x 4 weeks s/p tick bite to the area on 07/20/15. Pt reports that she was eating when she noticed the itching and rash. She states that every time that she eats something the rash becomes more pruritic. She has been cleaning the area with alcohol, taking Benadryl, and applying aloe vera with no relief of her itching or rash. She is followed by a PCP who she can follow up with. Denies fever, chills, nausea, or vomiting, myalgias, or neck pain.   Past Medical History  Diagnosis Date  . Diabetes mellitus   . Hypertension   . Anxiety   . Fibroid tumor   . 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 Ovarian cyst   . Kidney stone   . Seasonal allergies    Past Surgical History  Procedure Laterality Date  . Cholecystectomy    . Appendectomy    . Tonsillectomy    . Stomach and bowel surg    . Exploratory laparotomy w/ bowel resection     Family History  Problem Relation Age of Onset  . Hypertension Mother   . Thyroid cancer Mother   . Diabetes Maternal Grandmother   . Hypertension Maternal Grandmother   . Breast cancer Maternal Grandmother 78  . Diabetes Maternal Grandfather   . Hypertension Maternal Grandfather   . Deep vein thrombosis Daughter    Social History  Substance Use Topics  . Smoking status: Former  Research scientist (life sciences)  . Smokeless tobacco: Never Used  . Alcohol Use: Yes     Comment: social   OB History    Gravida Para Term Preterm AB TAB SAB Ectopic Multiple Living   2 2 2       2      Review of Systems A complete 10 system review of systems was obtained and all systems are negative except as noted in the HPI and PMH.   Allergies  Bactrim  Home Medications   Prior to Admission medications   Medication Sig Start Date End Date Taking? Authorizing Provider  Canagliflozin (INVOKANA PO) Take by mouth.   Yes Historical Provider, MD  Liraglutide -Weight Management (SAXENDA [place]) Inject into the skin.   Yes Historical Provider, MD  doxycycline (VIBRA-TABS) 100 MG tablet Take 1 tablet (100 mg total) by mouth 2 (two) times daily. Take to complete 7 days 12/19/14   Megan Salon, MD  fluconazole (DIFLUCAN) 150 MG tablet Take one tablet 72 hours after first dosage 12/19/14   Megan Salon, MD  lisinopril-hydrochlorothiazide (PRINZIDE,ZESTORETIC) 20-25 MG per tablet Take 1 tablet by mouth daily.      Historical Provider, MD  metFORMIN (GLUCOPHAGE) 1000 MG tablet Take 1,000 mg by mouth 2 (two) times daily.      Historical Provider, MD  traMADol (ULTRAM) 50 MG  tablet 1-2 tabs every 6 hours prn moderate pain 12/19/14   Megan Salon, MD   BP 110/74 mmHg  Pulse 84  Temp(Src) 98.1 F (36.7 C) (Oral)  Resp 20  Ht 5\' 4"  (1.626 m)  Wt 200 lb (90.719 kg)  BMI 34.31 kg/m2  SpO2 100%  LMP 08/16/2015   Physical Exam  Constitutional: She is oriented to person, place, and time. She appears well-developed and well-nourished. No distress.  HENT:  Head: Normocephalic and atraumatic.  Eyes: Conjunctivae are normal. Right eye exhibits no discharge. Left eye exhibits no discharge. No scleral icterus.  Cardiovascular: Normal rate.   Pulmonary/Chest: Effort normal.  Neurological: She is alert and oriented to person, place, and time. Coordination normal.  Skin: Skin is warm and dry. Rash noted. She is not  diaphoretic. There is erythema. No pallor.  6cmx4cm erythematous patch that is mildly raised with overlaying excoriations located on the left upper chest wall. No streaking or warmth.   Psychiatric: She has a normal mood and affect. Her behavior is normal.  Nursing note and vitals reviewed.  ED Course  Procedures   DIAGNOSTIC STUDIES: Oxygen Saturation is 100% on RA, normal by my interpretation.   COORDINATION OF CARE: 11:06 PM-Discussed next steps with pt including blood work and antibiotics. Pt verbalized understanding and is agreeable with the plan.   Labs Review Labs Reviewed - No data to display  I have personally reviewed and evaluated these lab results as part of my medical decision-making.   MDM   Final diagnoses:  Tick bite  Rash   Pt presents to the ED with complaint of a localized rash after a tick bite that occurred 1 month ago. Pt appears well in the ED, non-toxic and non-septic appearing. Rash does not appear to be consistent with erythema migrans. No neurological symptoms or deficits on exam. Patient does not appear to have any CNS toxicity. No fever. She lives disease in [place] spotted fever titers collected. Patient started on doxycycline. Recommend follow-up with PCP for reevaluation. She was given strict return precautions and symptoms to look for that would warrant return to the ED.   I personally performed the services described in this documentation, which was scribed in my presence. The recorded information has been reviewed and is accurate.   a  Carlos Levering, PA-C 08/24/15 0014  Orlie Dakin, MD 08/24/15 1450

## 2015-08-24 NOTE — Discharge Instructions (Signed)
Tick Bite Information Ticks are insects that attach themselves to the skin and draw blood for food. There are various types of ticks. Common types include wood ticks and deer ticks. Most ticks live in shrubs and grassy areas. Ticks can climb onto your body when you make contact with leaves or grass where the tick is waiting. The most common places on the body for ticks to attach themselves are the scalp, neck, armpits, waist, and groin. Most tick bites are harmless, but sometimes ticks carry germs that cause diseases. These germs can be spread to a person during the tick's feeding process. The chance of a disease spreading through a tick bite depends on:   The type of tick.  Time of year.   How long the tick is attached.   Geographic location.  HOW CAN YOU PREVENT TICK BITES? Take these steps to help prevent tick bites when you are outdoors:  Wear protective clothing. Long sleeves and long pants are best.   Wear white clothes so you can see ticks more easily.  Tuck your pant legs into your socks.   If walking on a trail, stay in the middle of the trail to avoid brushing against bushes.  Avoid walking through areas with long grass.  Put insect repellent on all exposed skin and along boot tops, pant legs, and sleeve cuffs.   Check clothing, hair, and skin repeatedly and before going inside.   Brush off any ticks that are not attached.  Take a shower or bath as soon as possible after being outdoors.  WHAT IS THE PROPER WAY TO REMOVE A TICK? Ticks should be removed as soon as possible to help prevent diseases caused by tick bites. 1. If latex gloves are available, put them on before trying to remove a tick.  2. Using fine-point tweezers, grasp the tick as close to the skin as possible. You may also use curved forceps or a tick removal tool. Grasp the tick as close to its head as possible. Avoid grasping the tick on its body. 3. Pull gently with steady upward pressure until  the tick lets go. Do not twist the tick or jerk it suddenly. This may break off the tick's head or mouth parts. 4. Do not squeeze or crush the tick's body. This could force disease-carrying fluids from the tick into your body.  5. After the tick is removed, wash the bite area and your hands with soap and water or other disinfectant such as alcohol. 6. Apply a small amount of antiseptic cream or ointment to the bite site.  7. Wash and disinfect any instruments that were used.  Do not try to remove a tick by applying a hot match, petroleum jelly, or fingernail polish to the tick. These methods do not work and may increase the chances of disease being spread from the tick bite.  WHEN SHOULD YOU SEEK MEDICAL CARE? Contact your health care provider if you are unable to remove a tick from your skin or if a part of the tick breaks off and is stuck in the skin.  After a tick bite, you need to be aware of signs and symptoms that could be related to diseases spread by ticks. Contact your health care provider if you develop any of the following in the days or weeks after the tick bite:  Unexplained fever.  Rash. A circular rash that appears days or weeks after the tick bite may indicate the possibility of Lyme disease. The rash may resemble  a target with a bull's-eye and may occur at a different part of your body than the tick bite.  Redness and swelling in the area of the tick bite.   Tender, swollen lymph glands.   Diarrhea.   Weight loss.   Cough.   Fatigue.   Muscle, joint, or bone pain.   Abdominal pain.   Headache.   Lethargy or a change in your level of consciousness.  Difficulty walking or moving your legs.   Numbness in the legs.   Paralysis.  Shortness of breath.   Confusion.   Repeated vomiting.    This information is not intended to replace advice given to you by your health care provider. Make sure you discuss any questions you have with your health  care provider.   Take antibiotics as prescribed. Follow up with your PCP for re-evaluation. Return to the ED if you experience severe muscle aches, neck pain or stiffness, fever, numbness or tingling in your extremities, loss of consciousness.

## 2015-08-25 LAB — B. BURGDORFI ANTIBODIES: B burgdorferi Ab IgG+IgM: <0.91 {ISR} (ref 0.00–0.90)

## 2015-08-26 LAB — ROCKY MTN SPOTTED FVR ABS PNL(IGG+IGM)
RMSF IgG: NEGATIVE
RMSF IgM: 0.23 {index} (ref 0.00–0.89)

## 2016-05-21 ENCOUNTER — Encounter: Payer: Medicaid Other | Admitting: Gynecology

## 2016-05-23 ENCOUNTER — Encounter: Payer: Medicaid Other | Admitting: Gynecology

## 2016-05-23 DIAGNOSIS — Z0289 Encounter for other administrative examinations: Secondary | ICD-10-CM

## 2016-07-23 ENCOUNTER — Encounter (HOSPITAL_BASED_OUTPATIENT_CLINIC_OR_DEPARTMENT_OTHER): Payer: Self-pay | Admitting: Emergency Medicine

## 2016-07-23 DIAGNOSIS — Z5321 Procedure and treatment not carried out due to patient leaving prior to being seen by health care provider: Secondary | ICD-10-CM | POA: Insufficient documentation

## 2016-07-23 DIAGNOSIS — R112 Nausea with vomiting, unspecified: Secondary | ICD-10-CM | POA: Diagnosis present

## 2016-07-23 DIAGNOSIS — R531 Weakness: Secondary | ICD-10-CM | POA: Diagnosis not present

## 2016-07-23 LAB — CBG MONITORING, ED: GLUCOSE-CAPILLARY: 136 mg/dL — AB (ref 65–99)

## 2016-07-23 MED ORDER — ONDANSETRON 4 MG PO TBDP
4.0000 mg | ORAL_TABLET | Freq: Once | ORAL | Status: AC | PRN
  Administered 2016-07-23: 4 mg via ORAL
  Filled 2016-07-23: qty 1

## 2016-07-23 NOTE — ED Triage Notes (Signed)
N/v & weakness x3 days. Reports epigastric pain 6/10.

## 2016-07-24 ENCOUNTER — Emergency Department (HOSPITAL_BASED_OUTPATIENT_CLINIC_OR_DEPARTMENT_OTHER)
Admission: EM | Admit: 2016-07-24 | Discharge: 2016-07-24 | Disposition: A | Discharge status: Home / Self Care | Payer: 59 | Attending: Emergency Medicine | Admitting: Emergency Medicine

## 2016-10-31 ENCOUNTER — Other Ambulatory Visit (HOSPITAL_BASED_OUTPATIENT_CLINIC_OR_DEPARTMENT_OTHER): Payer: Self-pay | Admitting: Physician Assistant

## 2016-10-31 DIAGNOSIS — Z1231 Encounter for screening mammogram for malignant neoplasm of breast: Secondary | ICD-10-CM

## 2016-11-05 DIAGNOSIS — G5600 Carpal tunnel syndrome, unspecified upper limb: Secondary | ICD-10-CM | POA: Insufficient documentation

## 2016-11-06 ENCOUNTER — Ambulatory Visit (HOSPITAL_BASED_OUTPATIENT_CLINIC_OR_DEPARTMENT_OTHER)
Admission: RE | Admit: 2016-11-06 | Discharge: 2016-11-06 | Disposition: A | Discharge status: Home / Self Care | Payer: 59 | Source: Ambulatory Visit | Attending: Physician Assistant | Admitting: Physician Assistant

## 2016-11-06 DIAGNOSIS — Z1231 Encounter for screening mammogram for malignant neoplasm of breast: Secondary | ICD-10-CM | POA: Insufficient documentation

## 2017-04-25 ENCOUNTER — Encounter: Payer: Self-pay | Admitting: Gynecology

## 2017-04-25 ENCOUNTER — Ambulatory Visit (INDEPENDENT_AMBULATORY_CARE_PROVIDER_SITE_OTHER): Payer: 59 | Admitting: Gynecology

## 2017-04-25 VITALS — BP 118/78 | Ht 65.0 in | Wt 196.0 lb

## 2017-04-25 DIAGNOSIS — Z01419 Encounter for gynecological examination (general) (routine) without abnormal findings: Secondary | ICD-10-CM

## 2017-04-25 DIAGNOSIS — N83202 Unspecified ovarian cyst, left side: Secondary | ICD-10-CM

## 2017-04-25 DIAGNOSIS — N83201 Unspecified ovarian cyst, right side: Secondary | ICD-10-CM

## 2017-04-25 NOTE — Patient Instructions (Signed)
Follow up for ultrasound as scheduled 

## 2017-04-25 NOTE — Progress Notes (Signed)
    Holly Wells August 03, 1970 [id number]        47 y.o.  [id number] for annual gynecologic exam.  Several issues noted below.  Past medical history,surgical history, problem list, medications, allergies, family history and social history were all reviewed and documented as reviewed in the EPIC chart.  ROS:  Performed with pertinent positives and negatives included in the history, assessment and plan.   Additional significant findings : None   Exam: Caryn Bee assistant Vitals:   04/25/17 1527  BP: 118/78  Weight: 196 lb (88.9 kg)  Height: 5\' 5"  (1.651 m)   Body mass index is 32.62 kg/m.  General appearance:  Normal affect, orientation and appearance. Skin: Grossly normal HEENT: Without gross lesions.  No cervical or supraclavicular adenopathy. Thyroid normal.  Lungs:  Clear without wheezing, rales or rhonchi Cardiac: RR, without RMG Abdominal:  Soft, nontender, without masses, guarding, rebound, organomegaly or hernia Breasts:  Examined lying and sitting without masses, retractions, discharge or axillary adenopathy. Pelvic:  Ext, BUS, Vagina: Normal  Cervix: Normal.  Uterus: Grossly normal without tenderness nontender   Adnexa: Without masses or tenderness    Anus and perineum: Normal   Rectovaginal: Normal sphincter tone without palpated masses or tenderness.    Assessment/Plan:  47 y.o. G22P2002 female for annual gynecologic exam with regular menses, no contraception.   1. Contraception.  HSG in the past showed blocked right tube and patent left tube after having an attempted postpartum tubal sterilization which was aborted due to adhesions.  Is using no contraception.  We have discussed the risk of pregnancy and she understands and accepts it. 2. History of chronic persistent right ovarian cyst and  more recent left ovarian cyst with last ultrasound 2016.  Patient has on and off pelvic pain that she has had for years.  Recommend baseline ultrasound now and patient agrees to  schedule.  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  This was debrided and an appendectomy was performed.  She has had a persistent right ovarian cyst with normal CA 125's.  Was referred to [facility] for consideration of surgery but at that point they recommended expectant management given risks of surgery weighed against the benefits.  She is done well over the past several years having intermittent pain but overall tolerating this. 3. Mammography 11/2016.  Continue with annual mammography when due.  Breast exam normal today. 4. Pap smear/HPV 05/2013.  No Pap smear done today.  No history of abnormal Pap smears previously.  Plan repeat Pap smear next year at 5-year interval per current screening guidelines. 5. Health maintenance.  No routine lab work done as patient does this elsewhere.  Follow-up for ultrasound as scheduled otherwise 1 year for annual exam, sooner as needed.     Anastasio Auerbach MD, 4:31 PM 04/25/2017

## 2017-05-04 ENCOUNTER — Encounter (HOSPITAL_COMMUNITY): Payer: Self-pay | Admitting: Emergency Medicine

## 2017-05-14 ENCOUNTER — Other Ambulatory Visit: Payer: 59

## 2017-05-14 ENCOUNTER — Ambulatory Visit: Payer: 59 | Admitting: Gynecology

## 2017-06-14 IMAGING — US US ART/VEN ABD/PELV/SCROTUM DOPPLER LTD
1 series · 13 of 25 positions shown · non-contrast
Comparison: CT of the abdomen and pelvis performed 02/27/2013, and
pelvic ultrasound performed 04/08/2012

CLINICAL DATA: Acute onset of left lower quadrant abdominal pain,
nausea and vomiting.

EXAM:
TRANSABDOMINAL AND TRANSVAGINAL ULTRASOUND OF PELVIS
DOPPLER ULTRASOUND OF OVARIES
TECHNIQUE: Both transabdominal and transvaginal ultrasound examinations of the
pelvis were performed. Transabdominal technique was performed for
global imaging of the pelvis including uterus, ovaries, adnexal
regions, and pelvic cul-de-sac.
It was necessary to proceed with endovaginal exam following the
transabdominal exam to visualize the uterus and ovaries in greater
detail. Color and duplex Doppler ultrasound was utilized to evaluate
blood flow to the ovaries.

[Series 1: us art/ven abd/pelv/scrotum doppler ltd · 0.25mm/px · 13 of 121 slices shown]
[im 1/121]
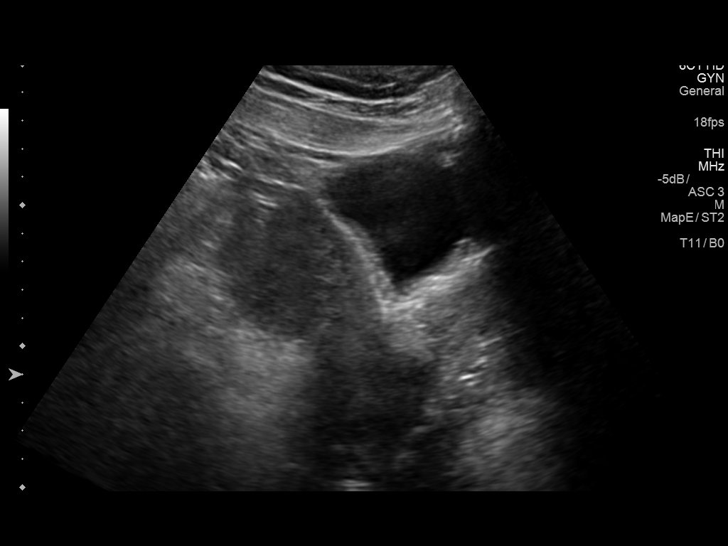
[im 11/121]
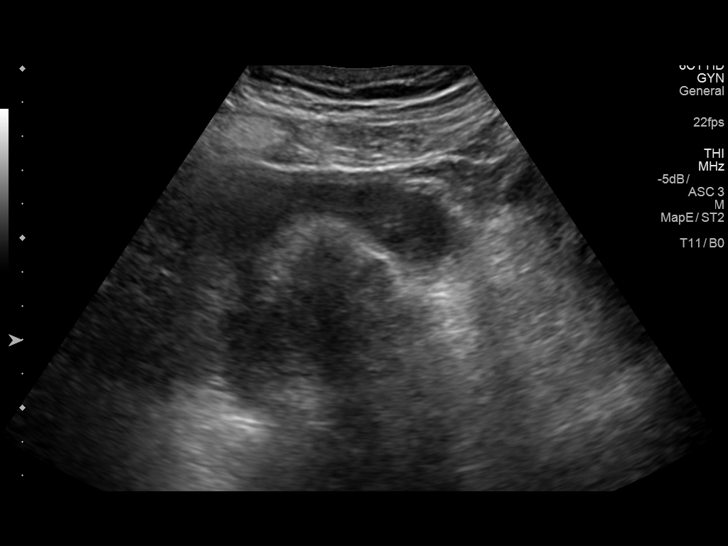
[im 21/121]
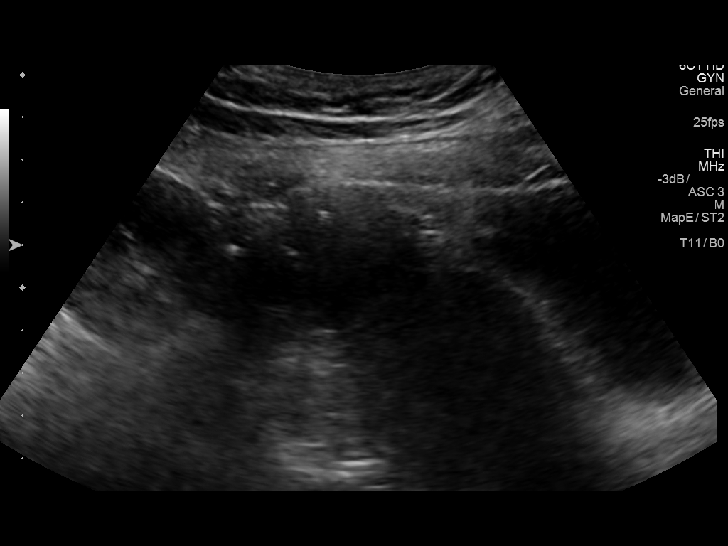
[im 31/121]
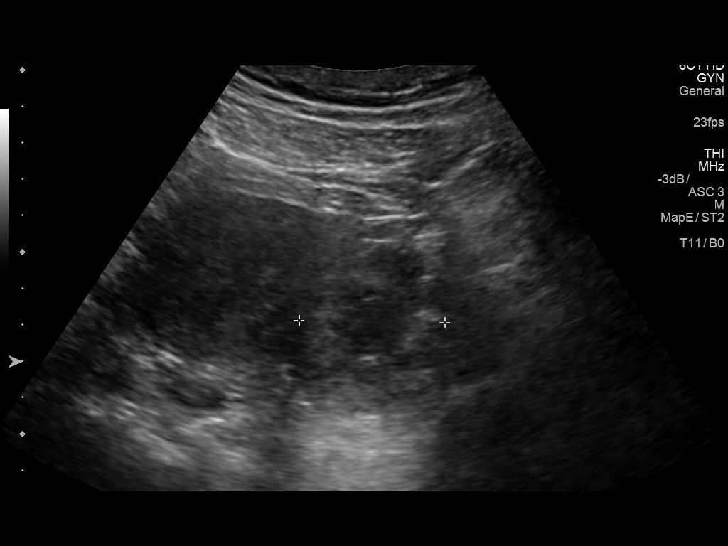
[im 41/121]
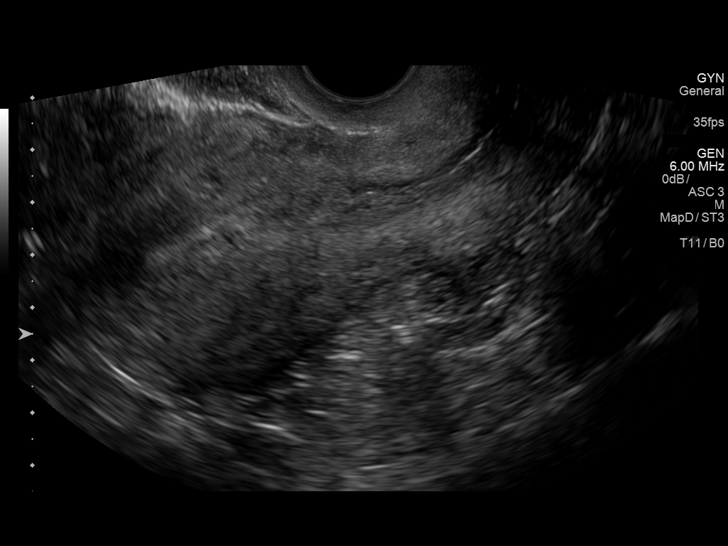
[im 51/121]
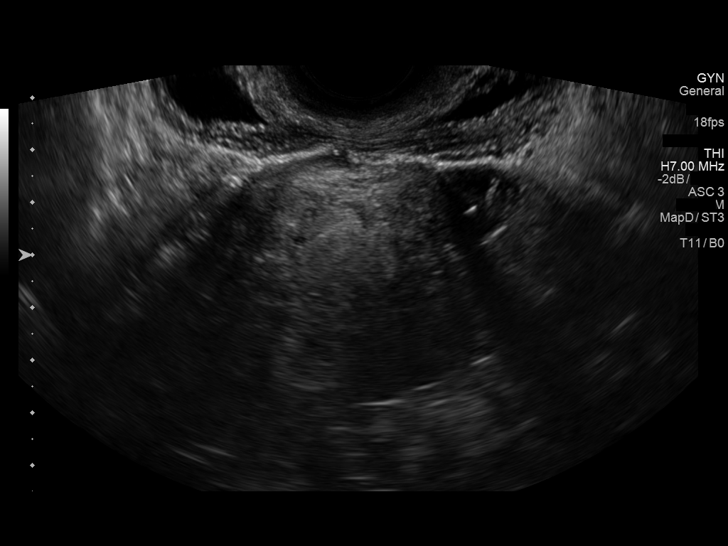
[im 61/121]
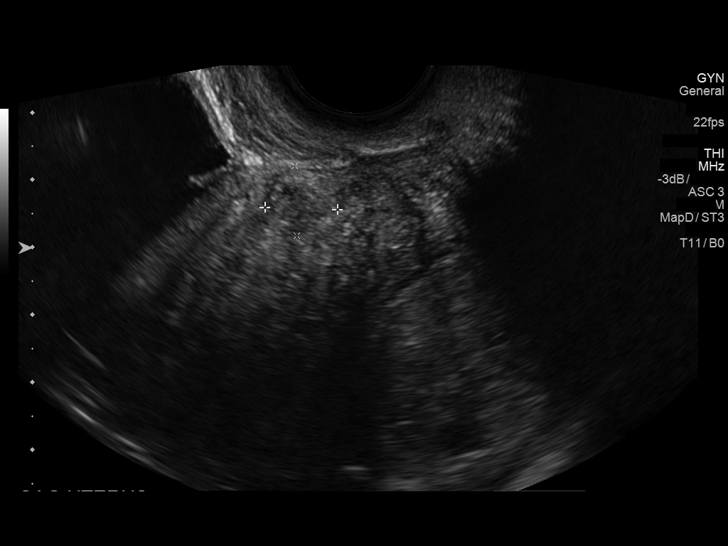
[im 71/121]
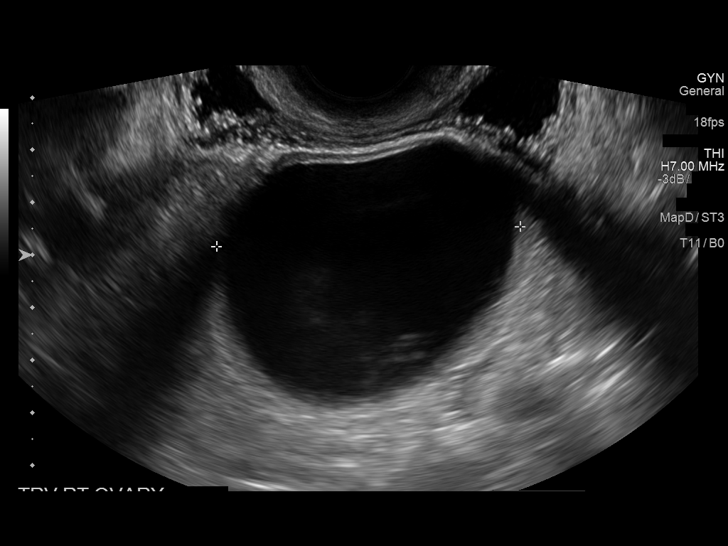
[im 81/121]
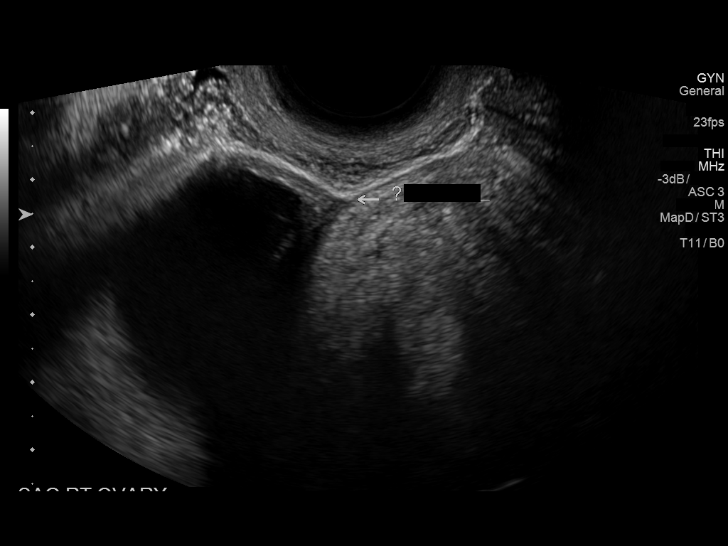
[im 91/121]
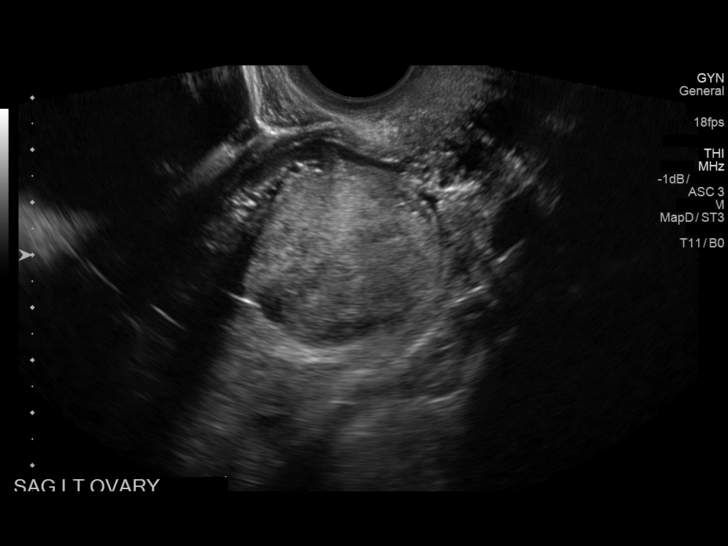
[im 101/121]
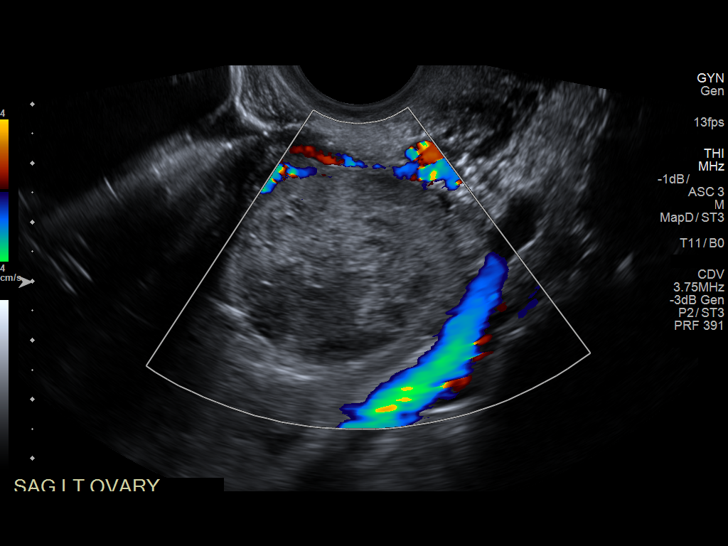
[im 111/121]
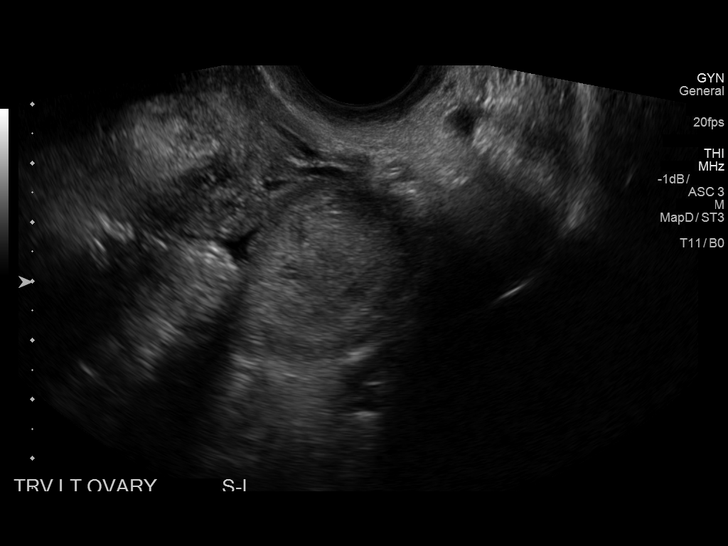
[im 121/121]
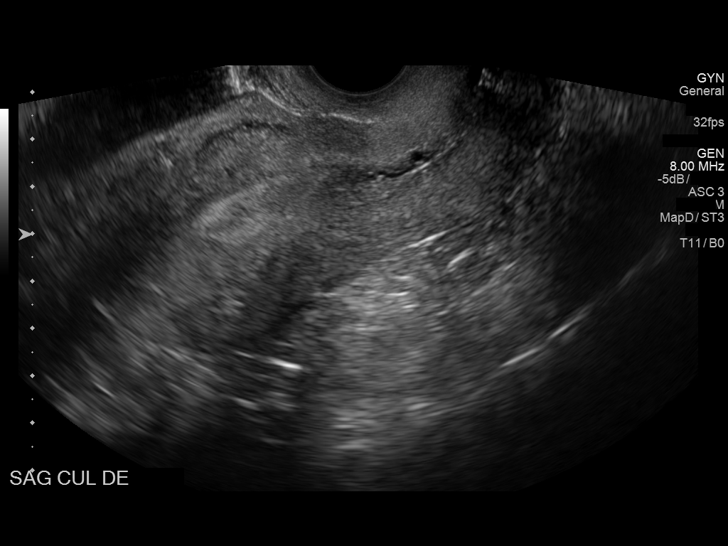

[13 of 25 positions shown; findings below may reference images not displayed]

FINDINGS: Uterus

Measurements: 11.2 x 5.2 x 6.0 cm. A 1.4 cm intramural fibroid is
noted at the left uterine fundus.

Endometrium

Thickness: 1.2 cm.  No focal abnormality visualized.

Right ovary

Measurements: 5.8 x 3.8 x 5.4 cm. A large anechoic cyst is noted at
the right ovary, measuring 6.1 x 4.9 x 4.4 cm.

Left ovary

Measurements: 4.4 x 3.4 x 4.3 cm. Heterogeneous in appearance; no
associated blood flow is visualized on Doppler evaluation.

Pulsed Doppler evaluation of the right ovary demonstrates normal
low-resistance arterial and venous waveforms. No blood flow is seen
with regard to the left ovary.

Other findings

Trace free fluid is seen within the pelvic cul-de-sac.
IMPRESSION: 1. No blood flow noted with regard to the left ovary, suspicious for
left ovarian torsion. The patient has associated severe left adnexal
tenderness.
2. Large anechoic right ovarian cyst noted, measuring 6.1 cm.
Visualized right ovarian tissue is grossly unremarkable.
3. Small uterine fibroid noted.  Uterus otherwise unremarkable.

Critical Value/emergent results were called by telephone at the time
of interpretation on 12/18/2014 at [DATE] to Dr. Nikko, who
verbally acknowledged these results.

## 2017-06-18 ENCOUNTER — Other Ambulatory Visit: Payer: 59

## 2017-06-18 ENCOUNTER — Ambulatory Visit: Payer: 59 | Admitting: Gynecology

## 2017-11-12 ENCOUNTER — Ambulatory Visit (HOSPITAL_BASED_OUTPATIENT_CLINIC_OR_DEPARTMENT_OTHER)
Admission: RE | Admit: 2017-11-12 | Discharge: 2017-11-12 | Disposition: A | Discharge status: Home / Self Care | Payer: 59 | Source: Ambulatory Visit | Attending: Gynecology | Admitting: Gynecology

## 2017-11-12 ENCOUNTER — Other Ambulatory Visit: Payer: Self-pay | Admitting: Gynecology

## 2017-11-12 DIAGNOSIS — Z1231 Encounter for screening mammogram for malignant neoplasm of breast: Secondary | ICD-10-CM

## 2017-11-22 ENCOUNTER — Encounter: Payer: Self-pay | Admitting: Gynecology

## 2017-11-25 ENCOUNTER — Other Ambulatory Visit: Payer: 59

## 2017-11-25 ENCOUNTER — Ambulatory Visit: Payer: 59 | Admitting: Gynecology

## 2017-12-10 ENCOUNTER — Ambulatory Visit: Payer: 59 | Admitting: Gynecology

## 2017-12-10 ENCOUNTER — Other Ambulatory Visit: Payer: 59

## 2017-12-10 DIAGNOSIS — Z0289 Encounter for other administrative examinations: Secondary | ICD-10-CM

## 2018-01-14 ENCOUNTER — Ambulatory Visit: Payer: 59 | Admitting: Gynecology

## 2018-01-14 ENCOUNTER — Other Ambulatory Visit: Payer: 59

## 2018-02-17 ENCOUNTER — Other Ambulatory Visit: Payer: 59

## 2018-02-17 ENCOUNTER — Ambulatory Visit: Payer: 59 | Admitting: Gynecology

## 2018-02-19 ENCOUNTER — Ambulatory Visit: Payer: 59 | Admitting: Gynecology

## 2018-02-19 ENCOUNTER — Other Ambulatory Visit: Payer: 59

## 2018-06-24 ENCOUNTER — Other Ambulatory Visit: Payer: Self-pay | Admitting: Obstetrics & Gynecology

## 2018-06-24 MED ORDER — TRAMADOL HCL 50 MG PO TABS: ORAL_TABLET | ORAL | 0 refills | Status: AC

## 2018-06-24 NOTE — Progress Notes (Signed)
Patient called after hours, returned her call at 5:00 PM.  Chronic lower abdominal/pelvic pain with h/o Ovarian cysts/Adhesions.  Pain is more severe than her baseline pain currently, but no dizziness, no fainting, no SOB, no fever.  Urine/BMs normal.  Tried a heating pad with no relief. Takes Tramadol 50 mg q6 hours to control the pain, has only 1 tab left.  Represcription sent to her pharmacy #[id number].  Patient will call our office in am to schedule a visit with Dr Phineas Real.  Pelvic US to schedule with visit if possible.  Precautions discussed with patient.  Will present at ER if worsening pain not controled with Tramadol.  Patient voiced understanding and agreement with plan.

## 2018-06-25 ENCOUNTER — Encounter: Payer: Self-pay | Admitting: Women's Health

## 2018-06-25 ENCOUNTER — Ambulatory Visit (INDEPENDENT_AMBULATORY_CARE_PROVIDER_SITE_OTHER): Payer: 59 | Admitting: Women's Health

## 2018-06-25 ENCOUNTER — Other Ambulatory Visit: Payer: Self-pay

## 2018-06-25 VITALS — BP 130/78

## 2018-06-25 DIAGNOSIS — R1030 Lower abdominal pain, unspecified: Secondary | ICD-10-CM

## 2018-06-25 MED ORDER — HYDROCODONE-IBUPROFEN 5-200 MG PO TABS: 1.0000 | ORAL_TABLET | Freq: Three times a day (TID) | ORAL | 0 refills | Status: DC | PRN

## 2018-06-25 NOTE — Patient Instructions (Signed)
Ovarian Cyst         An ovarian cyst is a fluid-filled sac that forms on an ovary. The ovaries are small organs that produce eggs in women. Various types of cysts can form on the ovaries. Some may cause symptoms and require treatment. Most ovarian cysts go away on their own, are not cancerous (are benign), and do not cause problems.  Common types of ovarian cysts include:  · Functional (follicle) cysts.  ? Occur during the menstrual cycle, and usually go away with the next menstrual cycle if you do not get pregnant.  ? Usually cause no symptoms.  · Endometriomas.  ? Are cysts that form from the tissue that lines the uterus (endometrium).  ? Are sometimes called “chocolate cysts” because they become filled with blood that turns brown.  ? Can cause pain in the lower abdomen during intercourse and during your period.  · Cystadenoma cysts.  ? Develop from cells on the outside surface of the ovary.  ? Can get very large and cause lower abdomen pain and pain with intercourse.  ? Can cause severe pain if they twist or break open (rupture).  · Dermoid cysts.  ? Are sometimes found in both ovaries.  ? May contain different kinds of body tissue, such as skin, teeth, hair, or cartilage.  ? Usually do not cause symptoms unless they get very big.  · Theca lutein cysts.  ? Occur when too much of a certain hormone (human chorionic gonadotropin) is produced and overstimulates the ovaries to produce an egg.  ? Are most common after having procedures used to assist with the conception of a baby (in vitro fertilization).  What are the causes?  Ovarian cysts may be caused by:  · Ovarian hyperstimulation syndrome. This is a condition that can develop from taking fertility medicines. It causes multiple large ovarian cysts to form.  · Polycystic ovarian syndrome (PCOS). This is a common hormonal disorder that can cause ovarian cysts, as well as problems with your period or fertility.  What increases the risk?  The following factors may  make you more likely to develop ovarian cysts:  · Being overweight or obese.  · Taking fertility medicines.  · Taking certain forms of hormonal birth control.  · Smoking.  What are the signs or symptoms?  Many ovarian cysts do not cause symptoms. If symptoms are present, they may include:  · Pelvic pain or pressure.  · Pain in the lower abdomen.  · Pain during sex.  · Abdominal swelling.  · Abnormal menstrual periods.  · Increasing pain with menstrual periods.  How is this diagnosed?  These cysts are commonly found during a routine pelvic exam. You may have tests to find out more about the cyst, such as:  · Ultrasound.  · X-ray of the pelvis.  · CT scan.  · MRI.  · Blood tests.  How is this treated?  Many ovarian cysts go away on their own without treatment. Your health care provider may want to check your cyst regularly for 2-3 months to see if it changes. If you are in menopause, it is especially important to have your cyst monitored closely because menopausal women have a higher rate of ovarian cancer.  When treatment is needed, it may include:  · Medicines to help relieve pain.  · A procedure to drain the cyst (aspiration).  · Surgery to remove the whole cyst.  · Hormone treatment or birth control pills. These methods are sometimes used   to help dissolve a cyst.  Follow these instructions at home:  · Take over-the-counter and prescription medicines only as told by your health care provider.  · Do not drive or use heavy machinery while taking prescription pain medicine.  · Get regular pelvic exams and Pap tests as often as told by your health care provider.  · Return to your normal activities as told by your health care provider. Ask your health care provider what activities are safe for you.  · Do not use any products that contain nicotine or tobacco, such as cigarettes and e-cigarettes. If you need help quitting, ask your health care provider.  · Keep all follow-up visits as told by your health care provider.  This is important.  Contact a health care provider if:  · Your periods are late, irregular, or painful, or they stop.  · You have pelvic pain that does not go away.  · You have pressure on your bladder or trouble emptying your bladder completely.  · You have pain during sex.  · You have any of the following in your abdomen:  ? A feeling of fullness.  ? Pressure.  ? Discomfort.  ? Pain that does not go away.  ? Swelling.  · You feel generally ill.  · You become constipated.  · You lose your appetite.  · You develop severe acne.  · You start to have more body hair and facial hair.  · You are gaining weight or losing weight without changing your exercise and eating habits.  · You think you may be pregnant.  Get help right away if:  · You have abdominal pain that is severe or gets worse.  · You cannot eat or drink without vomiting.  · You suddenly develop a fever.  · Your menstrual period is much heavier than usual.  This information is not intended to replace advice given to you by your health care provider. Make sure you discuss any questions you have with your health care provider.  Document Released: 01/22/2005 Document Revised: 08/12/2015 Document Reviewed: 06/26/2015  Elsevier Interactive Patient Education © 2019 [employer]

## 2018-06-25 NOTE — Progress Notes (Signed)
49 year old SBF G2 P2 presents with low bilateral abdominal sharp intermitting pain that has been increasing over the past 2 weeks.  Rates pain at a 7 today.  States pain starts before menses start and persist for 2 weeks.  Long-term history of low abdominal/sacral back pain due to ovarian cysts and adhesions.  States has had minimal relief with tramadol with ibuprofen.  2016 left ovarian complex cyst for 0.2 x 3.2 cm, persistent right ovarian simple cyst 5.4 x 5 cm seen on ultrasound in both 2015, and 16.  Denies any UTI type symptoms, vaginal discharge, vaginal irritation, or fever.  Requesting note to be out of work until June 1 due to the pain, reports does have FMLA.  Desk job.  Was recommended to have an ultrasound last year at annual exam did not follow-up.  Right fallopian tube blocked, left fallopian tube patent.  Not sexually active greater than a month.  Same partner denies need for STD screen.  Primary care manages hypertension, diabetes.  Plan: Exam: Appears uncomfortable, no CVAT.  Pain only in low sacral region.  Abdomen obese, tenderness with deep palpation no rebound or radiation of pain.  External genitalia within normal limits, speculum exam moderate amount of menses type blood noted, no erythema, odor, or discharge.  No CMT, tenderness with exam.  Probable ovarian cysts  Plan: Schedule ultrasound.  Vicoprofen 5/200 mg, start with half tablet, reviewed it is a controlled substance, addictive, to use sparingly, #15 with no refills.  Reviewed often is constipating, stool softeners as needed.  Requested note to be written to be out of work until June 1 note given.

## 2018-07-08 ENCOUNTER — Other Ambulatory Visit: Payer: Self-pay

## 2018-07-09 ENCOUNTER — Other Ambulatory Visit: Payer: Self-pay | Admitting: Women's Health

## 2018-07-09 ENCOUNTER — Ambulatory Visit: Payer: 59

## 2018-07-09 ENCOUNTER — Ambulatory Visit (INDEPENDENT_AMBULATORY_CARE_PROVIDER_SITE_OTHER): Payer: 59 | Admitting: Women's Health

## 2018-07-09 ENCOUNTER — Encounter: Payer: Self-pay | Admitting: Women's Health

## 2018-07-09 VITALS — BP 126/78

## 2018-07-09 DIAGNOSIS — R109 Unspecified abdominal pain: Secondary | ICD-10-CM | POA: Diagnosis not present

## 2018-07-09 DIAGNOSIS — N838 Other noninflammatory disorders of ovary, fallopian tube and broad ligament: Secondary | ICD-10-CM

## 2018-07-09 DIAGNOSIS — N83202 Unspecified ovarian cyst, left side: Secondary | ICD-10-CM

## 2018-07-09 DIAGNOSIS — N83201 Unspecified ovarian cyst, right side: Secondary | ICD-10-CM

## 2018-07-09 NOTE — Patient Instructions (Signed)
Ovarian Cyst         An ovarian cyst is a fluid-filled sac that forms on an ovary. The ovaries are small organs that produce eggs in women. Various types of cysts can form on the ovaries. Some may cause symptoms and require treatment. Most ovarian cysts go away on their own, are not cancerous (are benign), and do not cause problems.  Common types of ovarian cysts include:  · Functional (follicle) cysts.  ? Occur during the menstrual cycle, and usually go away with the next menstrual cycle if you do not get pregnant.  ? Usually cause no symptoms.  · Endometriomas.  ? Are cysts that form from the tissue that lines the uterus (endometrium).  ? Are sometimes called “chocolate cysts” because they become filled with blood that turns brown.  ? Can cause pain in the lower abdomen during intercourse and during your period.  · Cystadenoma cysts.  ? Develop from cells on the outside surface of the ovary.  ? Can get very large and cause lower abdomen pain and pain with intercourse.  ? Can cause severe pain if they twist or break open (rupture).  · Dermoid cysts.  ? Are sometimes found in both ovaries.  ? May contain different kinds of body tissue, such as skin, teeth, hair, or cartilage.  ? Usually do not cause symptoms unless they get very big.  · Theca lutein cysts.  ? Occur when too much of a certain hormone (human chorionic gonadotropin) is produced and overstimulates the ovaries to produce an egg.  ? Are most common after having procedures used to assist with the conception of a baby (in vitro fertilization).  What are the causes?  Ovarian cysts may be caused by:  · Ovarian hyperstimulation syndrome. This is a condition that can develop from taking fertility medicines. It causes multiple large ovarian cysts to form.  · Polycystic ovarian syndrome (PCOS). This is a common hormonal disorder that can cause ovarian cysts, as well as problems with your period or fertility.  What increases the risk?  The following factors may  make you more likely to develop ovarian cysts:  · Being overweight or obese.  · Taking fertility medicines.  · Taking certain forms of hormonal birth control.  · Smoking.  What are the signs or symptoms?  Many ovarian cysts do not cause symptoms. If symptoms are present, they may include:  · Pelvic pain or pressure.  · Pain in the lower abdomen.  · Pain during sex.  · Abdominal swelling.  · Abnormal menstrual periods.  · Increasing pain with menstrual periods.  How is this diagnosed?  These cysts are commonly found during a routine pelvic exam. You may have tests to find out more about the cyst, such as:  · Ultrasound.  · X-ray of the pelvis.  · CT scan.  · MRI.  · Blood tests.  How is this treated?  Many ovarian cysts go away on their own without treatment. Your health care provider may want to check your cyst regularly for 2-3 months to see if it changes. If you are in menopause, it is especially important to have your cyst monitored closely because menopausal women have a higher rate of ovarian cancer.  When treatment is needed, it may include:  · Medicines to help relieve pain.  · A procedure to drain the cyst (aspiration).  · Surgery to remove the whole cyst.  · Hormone treatment or birth control pills. These methods are sometimes used   to help dissolve a cyst.  Follow these instructions at home:  · Take over-the-counter and prescription medicines only as told by your health care provider.  · Do not drive or use heavy machinery while taking prescription pain medicine.  · Get regular pelvic exams and Pap tests as often as told by your health care provider.  · Return to your normal activities as told by your health care provider. Ask your health care provider what activities are safe for you.  · Do not use any products that contain nicotine or tobacco, such as cigarettes and e-cigarettes. If you need help quitting, ask your health care provider.  · Keep all follow-up visits as told by your health care provider.  This is important.  Contact a health care provider if:  · Your periods are late, irregular, or painful, or they stop.  · You have pelvic pain that does not go away.  · You have pressure on your bladder or trouble emptying your bladder completely.  · You have pain during sex.  · You have any of the following in your abdomen:  ? A feeling of fullness.  ? Pressure.  ? Discomfort.  ? Pain that does not go away.  ? Swelling.  · You feel generally ill.  · You become constipated.  · You lose your appetite.  · You develop severe acne.  · You start to have more body hair and facial hair.  · You are gaining weight or losing weight without changing your exercise and eating habits.  · You think you may be pregnant.  Get help right away if:  · You have abdominal pain that is severe or gets worse.  · You cannot eat or drink without vomiting.  · You suddenly develop a fever.  · Your menstrual period is much heavier than usual.  This information is not intended to replace advice given to you by your health care provider. Make sure you discuss any questions you have with your health care provider.  Document Released: 01/22/2005 Document Revised: 08/12/2015 Document Reviewed: 06/26/2015  Elsevier Interactive Patient Education © 2019 [employer]

## 2018-07-09 NOTE — Progress Notes (Signed)
48 year old MBF G2 P2 presents for ultrasound.  Was seen in the office 06/25/2018 with complaint of remittent low bilateral abdominal pain sharp intermitting pain that has been increasing over the past several weeks.  Had rated pain at about a 7 initially rates at a 3 today.  Has had a long-term history of low abdominal pain and ovarian cysts.  2016 right ovarian cyst 5.4 x 5 cm persistent for over a year with a low Ca 125, left ovarian cyst 3 cm.  History of adhesions of pelvic abscess, blocked right fallopian tube.  Denies vaginal discharge, urinary symptoms, nausea, GI changes or fever.  Primary care manages hypertension and diabetes.  Exam: Appears well.  Ultrasound: T/A and T/V anteverted displaced uterus intramural fibroids 20 x 12 mm, 15 x 13 mm, endometrium Tri layered.  Nabothian cyst.  Right ovary rim tissue seen, thin-walled echo-free avascular cystic mass 11 x 6.7 x 9.9 cm consumes ovary unable to see positive color flow of the ovarian rim.  Left ovary thin-walled cystic mass 34 x 32 x 28 mm negative CFD.  Negative cul-de-sac.  Large right ovarian cyst Persistent left ovarian cyst History of pelvic adhesions  Plan: Made with Dr. Phineas Real.  Ca 125 pending.  [place] report reviewed.  Since pain has decreased from 2 weeks will watch at this time and repeat ultrasound after next cycle.  Reviewed most likely will need to refer to [facility] if cyst persists, gets larger or pain increases/changes.  Directed to call or return if increased pain,

## 2018-07-28 ENCOUNTER — Other Ambulatory Visit: Payer: Self-pay

## 2018-07-28 ENCOUNTER — Emergency Department (HOSPITAL_BASED_OUTPATIENT_CLINIC_OR_DEPARTMENT_OTHER): Payer: 59

## 2018-07-28 ENCOUNTER — Encounter (HOSPITAL_BASED_OUTPATIENT_CLINIC_OR_DEPARTMENT_OTHER): Payer: Self-pay | Admitting: Emergency Medicine

## 2018-07-28 ENCOUNTER — Emergency Department (HOSPITAL_BASED_OUTPATIENT_CLINIC_OR_DEPARTMENT_OTHER)
Admission: EM | Admit: 2018-07-28 | Discharge: 2018-07-28 | Disposition: A | Discharge status: Home / Self Care | Payer: 59 | Attending: Emergency Medicine | Admitting: Emergency Medicine

## 2018-07-28 DIAGNOSIS — Z79899 Other long term (current) drug therapy: Secondary | ICD-10-CM | POA: Diagnosis not present

## 2018-07-28 DIAGNOSIS — I10 Essential (primary) hypertension: Secondary | ICD-10-CM | POA: Insufficient documentation

## 2018-07-28 DIAGNOSIS — Z87891 Personal history of nicotine dependence: Secondary | ICD-10-CM | POA: Insufficient documentation

## 2018-07-28 DIAGNOSIS — R05 Cough: Secondary | ICD-10-CM | POA: Insufficient documentation

## 2018-07-28 DIAGNOSIS — Z20828 Contact with and (suspected) exposure to other viral communicable diseases: Secondary | ICD-10-CM | POA: Diagnosis not present

## 2018-07-28 DIAGNOSIS — E119 Type 2 diabetes mellitus without complications: Secondary | ICD-10-CM | POA: Insufficient documentation

## 2018-07-28 DIAGNOSIS — J310 Chronic rhinitis: Secondary | ICD-10-CM | POA: Insufficient documentation

## 2018-07-28 DIAGNOSIS — R059 Cough, unspecified: Secondary | ICD-10-CM

## 2018-07-28 MED ORDER — LORATADINE 10 MG PO TABS
10.0000 mg | ORAL_TABLET | Freq: Once | ORAL | Status: AC
  Administered 2018-07-28: 10 mg via ORAL
  Filled 2018-07-28: qty 1

## 2018-07-28 MED ORDER — GUAIFENESIN ER 600 MG PO TB12: 600.0000 mg | ORAL_TABLET | Freq: Two times a day (BID) | ORAL | 0 refills | Status: DC

## 2018-07-28 MED ORDER — BENZONATATE 100 MG PO CAPS: 100.0000 mg | ORAL_CAPSULE | Freq: Three times a day (TID) | ORAL | 0 refills | Status: DC

## 2018-07-28 MED ORDER — FLUTICASONE PROPIONATE 50 MCG/ACT NA SUSP: 2.0000 | Freq: Every day | NASAL | 0 refills | Status: DC

## 2018-07-28 MED ORDER — GUAIFENESIN ER 600 MG PO TB12
1200.0000 mg | ORAL_TABLET | Freq: Two times a day (BID) | ORAL | Status: DC
  Filled 2018-07-28: qty 2

## 2018-07-28 MED ORDER — CETIRIZINE-PSEUDOEPHEDRINE ER 5-120 MG PO TB12: 1.0000 | ORAL_TABLET | Freq: Every day | ORAL | 0 refills | Status: DC

## 2018-07-28 NOTE — ED Triage Notes (Addendum)
C/o sob that started one week ago along with congestion, cough, and chills. Denies fevers. Has not seen MD for symptoms. States pain with inspiration. Has been taking robitussion, cough drops, and drinking fluids. Has had some diarrhea. No known contact with someone who is sick with covid. States her daughter had recent strep. O2 sats are 100 on RA.

## 2018-07-28 NOTE — Discharge Instructions (Signed)
Person Under Monitoring Name: Holly Wells  Location: [place] [address]   Infection Prevention Recommendations for Individuals Confirmed to have, or Being Evaluated for, 2019 Novel Coronavirus (COVID-19) Infection Who Receive Care at Home  Individuals who are confirmed to have, or are being evaluated for, COVID-19 should follow the prevention steps below until a healthcare provider or local or state health department says they can return to normal activities.  Stay home except to get medical care You should restrict activities outside your home, except for getting medical care. Do not go to work, school, or public areas, and do not use public transportation or taxis.  Call ahead before visiting your doctor Before your medical appointment, call the healthcare provider and tell them that you have, or are being evaluated for, COVID-19 infection. This will help the healthcare providers office take steps to keep other people from getting infected. Ask your healthcare provider to call the local or state health department.  Monitor your symptoms Seek prompt medical attention if your illness is worsening (e.g., difficulty breathing). Before going to your medical appointment, call the healthcare provider and tell them that you have, or are being evaluated for, COVID-19 infection. Ask your healthcare provider to call the local or state health department.  Wear a facemask You should wear a facemask that covers your nose and mouth when you are in the same room with other people and when you visit a healthcare provider. People who live with or visit you should also wear a facemask while they are in the same room with you.  Separate yourself from other people in your home As much as possible, you should stay in a different room from other people in your home. Also, you should use a separate bathroom, if available.  Avoid sharing household items You should not  share dishes, drinking glasses, cups, eating utensils, towels, bedding, or other items with other people in your home. After using these items, you should wash them thoroughly with soap and water.  Cover your coughs and sneezes Cover your mouth and nose with a tissue when you cough or sneeze, or you can cough or sneeze into your sleeve. Throw used tissues in a lined trash can, and immediately wash your hands with soap and water for at least 20 seconds or use an alcohol-based hand rub.  Wash your [facility] your hands often and thoroughly with soap and water for at least 20 seconds. You can use an alcohol-based hand sanitizer if soap and water are not available and if your hands are not visibly dirty. Avoid touching your eyes, nose, and mouth with unwashed hands.   Prevention Steps for Caregivers and Household Members of Individuals Confirmed to have, or Being Evaluated for, COVID-19 Infection Being Cared for in the Home  If you live with, or provide care at home for, a person confirmed to have, or being evaluated for, COVID-19 infection please follow these guidelines to prevent infection:  Follow healthcare providers instructions Make sure that you understand and can help the patient follow any healthcare provider instructions for all care.  Provide for the patients basic needs You should help the patient with basic needs in the home and provide support for getting groceries, prescriptions, and other personal needs.  Monitor the patients symptoms If they are getting sicker, call his or her medical provider and tell them that the patient has, or is being evaluated for, COVID-19 infection. This will help the healthcare providers  office take steps to keep other people from getting infected. Ask the healthcare provider to call the local or state health department.  Limit the number of people who have contact with the patient If possible, have only one caregiver for the  patient. Other household members should stay in another home or place of residence. If this is not possible, they should stay in another room, or be separated from the patient as much as possible. Use a separate bathroom, if available. Restrict visitors who do not have an essential need to be in the home.  Keep older adults, very young children, and other sick people away from the patient Keep older adults, very young children, and those who have compromised immune systems or chronic health conditions away from the patient. This includes people with chronic heart, lung, or kidney conditions, diabetes, and cancer.  Ensure good ventilation Make sure that shared spaces in the home have good air flow, such as from an air conditioner or an opened window, weather permitting.  Wash your hands often Wash your hands often and thoroughly with soap and water for at least 20 seconds. You can use an alcohol based hand sanitizer if soap and water are not available and if your hands are not visibly dirty. Avoid touching your eyes, nose, and mouth with unwashed hands. Use disposable paper towels to dry your hands. If not available, use dedicated cloth towels and replace them when they become wet.  Wear a facemask and gloves Wear a disposable facemask at all times in the room and gloves when you touch or have contact with the patients blood, body fluids, and/or secretions or excretions, such as sweat, saliva, sputum, nasal mucus, vomit, urine, or feces.  Ensure the mask fits over your nose and mouth tightly, and do not touch it during use. Throw out disposable facemasks and gloves after using them. Do not reuse. Wash your hands immediately after removing your facemask and gloves. If your personal clothing becomes contaminated, carefully remove clothing and launder. Wash your hands after handling contaminated clothing. Place all used disposable facemasks, gloves, and other waste in a lined container before  disposing them with other household waste. Remove gloves and wash your hands immediately after handling these items.  Do not share dishes, glasses, or other household items with the patient Avoid sharing household items. You should not share dishes, drinking glasses, cups, eating utensils, towels, bedding, or other items with a patient who is confirmed to have, or being evaluated for, COVID-19 infection. After the person uses these items, you should wash them thoroughly with soap and water.  Wash laundry thoroughly Immediately remove and wash clothes or bedding that have blood, body fluids, and/or secretions or excretions, such as sweat, saliva, sputum, nasal mucus, vomit, urine, or feces, on them. Wear gloves when handling laundry from the patient. Read and follow directions on labels of laundry or clothing items and detergent. In general, wash and dry with the warmest temperatures recommended on the label.  Clean all areas the individual has used often Clean all touchable surfaces, such as counters, tabletops, doorknobs, bathroom fixtures, toilets, phones, keyboards, tablets, and bedside tables, every day. Also, clean any surfaces that may have blood, body fluids, and/or secretions or excretions on them. Wear gloves when cleaning surfaces the patient has come in contact with. Use a diluted bleach solution (e.g., dilute bleach with 1 part bleach and 10 parts water) or a household disinfectant with a label that says EPA-registered for coronaviruses. To make a  bleach solution at home, add 1 tablespoon of bleach to 1 quart (4 cups) of water. For a larger supply, add  cup of bleach to 1 gallon (16 cups) of water. Read labels of cleaning products and follow recommendations provided on product labels. Labels contain instructions for safe and effective use of the cleaning product including precautions you should take when applying the product, such as wearing gloves or eye protection and making sure you  have good ventilation during use of the product. Remove gloves and wash hands immediately after cleaning.  Monitor yourself for signs and symptoms of illness Caregivers and household members are considered close contacts, should monitor their health, and will be asked to limit movement outside of the home to the extent possible. Follow the monitoring steps for close contacts listed on the symptom monitoring form.   ? If you have additional questions, contact your local health department or call the epidemiologist on call at [phone] (available 24/7). ? This guidance is subject to change. For the most up-to-date guidance from [facility], please refer to their website: [email or web address]

## 2018-07-28 NOTE — ED Provider Notes (Signed)
[facility] EMERGENCY DEPARTMENT Provider Note   CSN: [id number] Arrival date & time: 07/28/18  0357     History   Chief Complaint Chief Complaint  Patient presents with  . sob, congestion, cough,    HPI Holly Wells is a 48 y.o. female.     The history is provided by the patient.  Cough Cough characteristics:  Non-productive Severity:  Moderate Onset quality:  Gradual Duration:  7 days Timing:  Intermittent Progression:  Unchanged Chronicity:  New Smoker: no   Context: upper respiratory infection   Context: not with activity   Relieved by:  Nothing Worsened by:  Nothing Ineffective treatments:  Decongestant (cough drops) Associated symptoms: ear fullness, rhinorrhea and sinus congestion   Associated symptoms: no chest pain, no diaphoresis, no fever and no wheezing   Risk factors: no recent travel   Patient presents with a weeks of dry cough and nasal and chest congestion and post nasal drip,  Now also has diarrhea.  No known exposure to covid but goes to the store regularly.  No DOE, no CP. No n/v/d.  No f/c/r.  No anosmia.    Past Medical History:  Diagnosis Date  . Anxiety   . Diabetes mellitus   . 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 Fibroid tumor   . Hypertension   . Kidney stone   . Ovarian cyst   . Seasonal allergies     Patient Active Problem List   Diagnosis Date Noted  . Left ovarian cyst 12/19/2014  . LLQ pain 12/19/2014  . Labial abscess 12/19/2014  . Fallopian tube disorder   . MENORRHAGIA 12/22/2007  . HIDRADENITIS SUPPURATIVA 06/06/2007  . HYPERLIPIDEMIA 05/22/2007  . DIABETES MELLITUS II, UNCOMPLICATED 70/17/7939  . OBESITY, NOS 04/04/2006  . DEPRESSION, MAJOR, RECURRENT 04/04/2006  . PANIC ATTACKS 04/04/2006  . HYPERTENSION, BENIGN SYSTEMIC 04/04/2006  . GASTROESOPHAGEAL REFLUX, NO ESOPHAGITIS 04/04/2006    Past Surgical History:  Procedure Laterality Date  . APPENDECTOMY    .  CHOLECYSTECTOMY    . EXPLORATORY LAPAROTOMY W/ BOWEL RESECTION    . Stomach and bowel surg    . TONSILLECTOMY       OB History    Gravida  2   Para  2   Term  2   Preterm      AB      Living  2     SAB      TAB      Ectopic      Multiple      Live Births               Home Medications    Prior to Admission medications   Medication Sig Start Date End Date Taking? Authorizing Provider  Canagliflozin (INVOKANA PO) Take by mouth.    [provider]  hydrocodone-ibuprofen (VICOPROFEN) 5-200 MG tablet Take 1 tablet by mouth every 8 (eight) hours as needed for pain. 06/25/18   Huel Cote, NP  Liraglutide -Weight Management (SAXENDA [place]) Inject into the skin.    [provider]  lisinopril-hydrochlorothiazide (PRINZIDE,ZESTORETIC) 20-25 MG per tablet Take 1 tablet by mouth daily.      [provider]  metFORMIN (GLUCOPHAGE) 1000 MG tablet Take 1,000 mg by mouth 2 (two) times daily.      [provider]  traMADol (ULTRAM) 50 MG tablet 1-2 tabs every 6 hours prn moderate pain 06/24/18   Princess Bruins, MD  Family History Family History  Problem Relation Age of Onset  . Hypertension Mother   . Thyroid cancer Mother   . Diabetes Maternal Grandmother   . Hypertension Maternal Grandmother   . Breast cancer Maternal Grandmother 78  . Diabetes Maternal Grandfather   . Hypertension Maternal Grandfather   . Deep vein thrombosis Daughter     Social History Social History   Tobacco Use  . Smoking status: Former Research scientist (life sciences)  . Smokeless tobacco: Never Used  Substance Use Topics  . Alcohol use: Yes    Comment: social  . Drug use: No     Allergies   Bactrim [sulfamethoxazole-trimethoprim]   Review of Systems Review of Systems  Constitutional: Negative for diaphoresis and fever.  HENT: Positive for congestion, postnasal drip, rhinorrhea and trouble swallowing.   Respiratory: Positive for cough. Negative for wheezing.    Cardiovascular: Negative for chest pain, palpitations and leg swelling.  Gastrointestinal: Positive for diarrhea.     Physical Exam Updated Vital Signs BP (!) 142/91 (BP Location: Right Arm)   Pulse 89   Temp 98.2 F (36.8 C) (Oral)   Resp 18   Ht 5\' 4"  (1.626 m)   Wt 88.5 kg   LMP 07/21/2018 (Approximate)   SpO2 99%   BMI 33.47 kg/m   Physical Exam Vitals signs and nursing note reviewed.  Constitutional:      General: She is not in acute distress.    Appearance: She is obese.  HENT:     Head: Normocephalic and atraumatic.     Nose: Nose normal.     Mouth/Throat:     Mouth: Mucous membranes are moist.     Pharynx: Oropharynx is clear. No oropharyngeal exudate.  Eyes:     Conjunctiva/sclera: Conjunctivae normal.     Pupils: Pupils are equal, round, and reactive to light.  Neck:     Musculoskeletal: Normal range of motion and neck supple. No neck rigidity.  Cardiovascular:     Rate and Rhythm: Normal rate and regular rhythm.     Pulses: Normal pulses.     Heart sounds: Normal heart sounds.  Pulmonary:     Effort: Pulmonary effort is normal. No respiratory distress.     Breath sounds: Normal breath sounds. No wheezing or rales.  Abdominal:     General: Abdomen is flat. Bowel sounds are normal.     Tenderness: There is no abdominal tenderness. There is no guarding.  Musculoskeletal: Normal range of motion.        General: No tenderness.     Right lower leg: No edema.     Left lower leg: No edema.  Lymphadenopathy:     Cervical: No cervical adenopathy.  Skin:    General: Skin is warm and dry.     Capillary Refill: Capillary refill takes less than 2 seconds.  Neurological:     General: No focal deficit present.     Mental Status: She is alert and oriented to person, place, and time.  Psychiatric:        Mood and Affect: Mood normal.        Behavior: Behavior normal.      ED Treatments / Results  Labs (all labs ordered are listed, but only abnormal results  are displayed) Labs Reviewed  NOVEL CORONAVIRUS, NAA (HOSPITAL ORDER, SEND-OUT TO REF LAB)    EKG    Radiology Dg Chest Portable 1 View  Result Date: 07/28/2018 CLINICAL DATA:  Shortness of breath EXAM: PORTABLE CHEST 1 VIEW COMPARISON:  12/18/2014 chest CT FINDINGS: The heart size and mediastinal contours are within normal limits. Both lungs are clear. The visualized skeletal structures are unremarkable. IMPRESSION: Negative chest. Electronically Signed   By: Monte Fantasia M.D.   On: 07/28/2018 04:57    Procedures Procedures (including critical care time)  Medications Ordered in ED Medications  guaiFENesin (MUCINEX) 12 hr tablet 1,200 mg (has no administration in time range)  loratadine (CLARITIN) tablet 10 mg (10 mg Oral Given 07/28/18 0455)   PERC negative wells 0 highly doubt PE in this low risk patient.    COVID swab sent patient will sign isolation agreement.  Instructions for in home quarantine given verbally with nurse Chanin present.  Based on the CENTOR criteria there is no indication for testing or treatment.  Symptoms are likely allergic and will will prescribe symptomatic relief.    Final Clinical Impressions(s) / ED Diagnoses   Return for intractable cough, coughing up blood,fevers >100.4 unrelieved by medication, shortness of breath, intractable vomiting, chest pain, shortness of breath, weakness,numbness, changes in speech, facial asymmetry,abdominal pain, passing out,Inability to tolerate liquids or food, cough, altered mental status or any concerns. No signs of systemic illness or infection. The patient is nontoxic-appearing on exam and vital signs are within normal limits.   I have reviewed the triage vital signs and the nursing notes. Pertinent labs &imaging results that were available during my care of the patient were reviewed by me and considered in my medical decision making (see chart for details).  After history, exam, and medical workup I feel  the patient has been appropriately medically screened and is safe for discharge home. Pertinent diagnoses were discussed with the patient. Patient was given return precautions      Delanie Tirrell, MD 07/28/18 2947

## 2018-07-28 NOTE — ED Notes (Signed)
Pt signed her guidance for persons under investigation form and voices understanding of importance of quarantine until test results are available and if covid-19 testing is positive then instructions given.

## 2018-07-29 LAB — NOVEL CORONAVIRUS, NAA (HOSP ORDER, SEND-OUT TO REF LAB; TAT 18-24 HRS): SARS-CoV-2, NAA: NOT DETECTED

## 2018-08-13 ENCOUNTER — Other Ambulatory Visit: Payer: Self-pay

## 2018-08-13 ENCOUNTER — Emergency Department (HOSPITAL_BASED_OUTPATIENT_CLINIC_OR_DEPARTMENT_OTHER)
Admission: EM | Admit: 2018-08-13 | Discharge: 2018-08-13 | Disposition: A | Discharge status: Home / Self Care | Payer: 59 | Attending: Emergency Medicine | Admitting: Emergency Medicine

## 2018-08-13 ENCOUNTER — Encounter (HOSPITAL_BASED_OUTPATIENT_CLINIC_OR_DEPARTMENT_OTHER): Payer: Self-pay | Admitting: Emergency Medicine

## 2018-08-13 DIAGNOSIS — R05 Cough: Secondary | ICD-10-CM | POA: Insufficient documentation

## 2018-08-13 DIAGNOSIS — E119 Type 2 diabetes mellitus without complications: Secondary | ICD-10-CM | POA: Insufficient documentation

## 2018-08-13 DIAGNOSIS — I10 Essential (primary) hypertension: Secondary | ICD-10-CM | POA: Insufficient documentation

## 2018-08-13 DIAGNOSIS — Z87891 Personal history of nicotine dependence: Secondary | ICD-10-CM | POA: Insufficient documentation

## 2018-08-13 DIAGNOSIS — Z7984 Long term (current) use of oral hypoglycemic drugs: Secondary | ICD-10-CM | POA: Insufficient documentation

## 2018-08-13 DIAGNOSIS — Z79899 Other long term (current) drug therapy: Secondary | ICD-10-CM | POA: Diagnosis not present

## 2018-08-13 DIAGNOSIS — E669 Obesity, unspecified: Secondary | ICD-10-CM | POA: Insufficient documentation

## 2018-08-13 DIAGNOSIS — R059 Cough, unspecified: Secondary | ICD-10-CM

## 2018-08-13 MED ORDER — FAMOTIDINE 20 MG PO TABS
20.0000 mg | ORAL_TABLET | Freq: Once | ORAL | Status: AC
  Administered 2018-08-13: 20 mg via ORAL
  Filled 2018-08-13: qty 1

## 2018-08-13 MED ORDER — ALUM & MAG HYDROXIDE-SIMETH 200-200-20 MG/5ML PO SUSP
30.0000 mL | Freq: Once | ORAL | Status: AC
  Administered 2018-08-13: 30 mL via ORAL
  Filled 2018-08-13: qty 30

## 2018-08-13 MED ORDER — DEXAMETHASONE SODIUM PHOSPHATE 4 MG/ML IJ SOLN
4.0000 mg | Freq: Once | INTRAMUSCULAR | Status: AC
  Administered 2018-08-13: 4 mg via INTRAMUSCULAR

## 2018-08-13 MED ORDER — DEXAMETHASONE SODIUM PHOSPHATE 4 MG/ML IJ SOLN
INTRAMUSCULAR | Status: AC
  Filled 2018-08-13: qty 1

## 2018-08-13 MED ORDER — FAMOTIDINE 20 MG PO TABS: 20.0000 mg | ORAL_TABLET | Freq: Two times a day (BID) | ORAL | 0 refills | Status: DC

## 2018-08-13 NOTE — ED Provider Notes (Signed)
[facility] Provider Note   CSN: [id number] Arrival date & time: 08/13/18  2800     History   Chief Complaint Chief Complaint  Patient presents with  . Cough    HPI Holly Wells is a 48 y.o. female.     The history is provided by the patient.  Cough Cough characteristics:  Non-productive Severity:  Mild Onset quality:  Gradual Timing:  Sporadic Progression:  Unchanged (mostly at night lying flat) Context: not animal exposure   Relieved by:  Nothing Worsened by:  Nothing Ineffective treatments:  Decongestant and cough suppressants (zpak) Associated symptoms: no chest pain, no diaphoresis, no fever, no shortness of breath and no wheezing   Associated symptoms comment:  Post nasal drip Risk factors: no chemical exposure   patient has been tested by me for covid.  Symptoms persisted and was given a Zpak which has not changed symptoms.  She has stopped zyrtec.  She states it is mostly at night.  She states she takes something for GERD.  But eats a lot of greasy spicy food.    Past Medical History:  Diagnosis Date  . Anxiety   . Diabetes mellitus   . 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 Fibroid tumor   . Hypertension   . Kidney stone   . Ovarian cyst   . Seasonal allergies     Patient Active Problem List   Diagnosis Date Noted  . Left ovarian cyst 12/19/2014  . LLQ pain 12/19/2014  . Labial abscess 12/19/2014  . Fallopian tube disorder   . MENORRHAGIA 12/22/2007  . HIDRADENITIS SUPPURATIVA 06/06/2007  . HYPERLIPIDEMIA 05/22/2007  . DIABETES MELLITUS II, UNCOMPLICATED 34/91/7915  . OBESITY, NOS 04/04/2006  . DEPRESSION, MAJOR, RECURRENT 04/04/2006  . PANIC ATTACKS 04/04/2006  . HYPERTENSION, BENIGN SYSTEMIC 04/04/2006  . GASTROESOPHAGEAL REFLUX, NO ESOPHAGITIS 04/04/2006    Past Surgical History:  Procedure Laterality Date  . APPENDECTOMY    . CHOLECYSTECTOMY    . EXPLORATORY LAPAROTOMY W/ BOWEL  RESECTION    . Stomach and bowel surg    . TONSILLECTOMY       OB History    Gravida  2   Para  2   Term  2   Preterm      AB      Living  2     SAB      TAB      Ectopic      Multiple      Live Births               Home Medications    Prior to Admission medications   Medication Sig Start Date End Date Taking? Authorizing Provider  Canagliflozin (INVOKANA PO) Take by mouth.   Yes [provider]  guaiFENesin (MUCINEX) 600 MG 12 hr tablet Take 1 tablet (600 mg total) by mouth 2 (two) times daily. 07/28/18  Yes Dawson Hollman, MD  metFORMIN (GLUCOPHAGE) 1000 MG tablet Take 1,000 mg by mouth 2 (two) times daily.     Yes [provider]  benzonatate (TESSALON) 100 MG capsule Take 1 capsule (100 mg total) by mouth every 8 (eight) hours. 07/28/18   Miachel Nardelli, MD  cetirizine-pseudoephedrine (ZYRTEC-D) 5-120 MG tablet Take 1 tablet by mouth daily. 07/28/18   Benancio Osmundson, MD  famotidine (PEPCID) 20 MG tablet Take 1 tablet (20 mg total) by mouth 2 (two) times daily. 08/13/18   Maryhelen Lindler,  MD  fluticasone (FLONASE) 50 MCG/ACT nasal spray Place 2 sprays into both nostrils daily. 07/28/18   Chestine Belknap, MD  hydrocodone-ibuprofen (VICOPROFEN) 5-200 MG tablet Take 1 tablet by mouth every 8 (eight) hours as needed for pain. 06/25/18   Huel Cote, NP  Liraglutide -Weight Management (SAXENDA [place]) Inject into the skin.    [provider]  lisinopril-hydrochlorothiazide (PRINZIDE,ZESTORETIC) 20-25 MG per tablet Take 1 tablet by mouth daily.      [provider]  traMADol Veatrice Bourbon) 50 MG tablet 1-2 tabs every 6 hours prn moderate pain 06/24/18   Princess Bruins, MD    Family History Family History  Problem Relation Age of Onset  . Hypertension Mother   . Thyroid cancer Mother   . Diabetes Maternal Grandmother   . Hypertension Maternal Grandmother   . Breast cancer Maternal Grandmother 78  . Diabetes Maternal Grandfather   .  Hypertension Maternal Grandfather   . Deep vein thrombosis Daughter     Social History Social History   Tobacco Use  . Smoking status: Former Research scientist (life sciences)  . Smokeless tobacco: Never Used  Substance Use Topics  . Alcohol use: Yes    Comment: social  . Drug use: No     Allergies   Bactrim [sulfamethoxazole-trimethoprim]   Review of Systems Review of Systems  Constitutional: Negative for diaphoresis and fever.  HENT: Negative for trouble swallowing and voice change.   Respiratory: Positive for cough. Negative for choking, chest tightness, shortness of breath, wheezing and stridor.   Cardiovascular: Negative for chest pain, palpitations and leg swelling.  All other systems reviewed and are negative.    Physical Exam Updated Vital Signs BP 120/90 (BP Location: Right Arm)   Pulse (!) 108   Temp 98.2 F (36.8 C) (Oral)   Resp 16   Ht 5\' 5"  (1.651 m)   Wt 88.5 kg   LMP 07/21/2018 (Approximate)   SpO2 99%   BMI 32.45 kg/m   Physical Exam Vitals signs and nursing note reviewed.  Constitutional:      Appearance: She is obese. She is not ill-appearing.  HENT:     Head: Normocephalic and atraumatic.     Nose: Nose normal.     Mouth/Throat:     Mouth: Mucous membranes are moist.     Pharynx: Oropharynx is clear.  Eyes:     Conjunctiva/sclera: Conjunctivae normal.     Pupils: Pupils are equal, round, and reactive to light.  Neck:     Musculoskeletal: Normal range of motion and neck supple.  Cardiovascular:     Rate and Rhythm: Normal rate and regular rhythm.     Pulses: Normal pulses.     Heart sounds: Normal heart sounds.  Pulmonary:     Effort: Pulmonary effort is normal. No respiratory distress.     Breath sounds: Normal breath sounds. No stridor. No wheezing, rhonchi or rales.  Chest:     Chest wall: No tenderness.  Abdominal:     General: Abdomen is flat. Bowel sounds are normal.     Tenderness: There is no abdominal tenderness.  Musculoskeletal: Normal range  of motion.  Skin:    General: Skin is warm and dry.     Capillary Refill: Capillary refill takes less than 2 seconds.  Neurological:     General: No focal deficit present.     Mental Status: She is alert and oriented to person, place, and time.  Psychiatric:        Mood and Affect: Mood  normal.        Behavior: Behavior normal.      ED Treatments / Results  Labs (all labs ordered are listed, but only abnormal results are displayed) Labs Reviewed - No data to display  EKG None  Radiology No results found.  Procedures Procedures (including critical care time)  Medications Ordered in ED Medications  famotidine (PEPCID) tablet 20 mg (20 mg Oral Given 08/13/18 0353)  alum & mag hydroxide-simeth (MAALOX/MYLANTA) 200-200-20 MG/5ML suspension 30 mL (30 mLs Oral Given 08/13/18 0353)  dexamethasone (DECADRON) injection 4 mg (4 mg Intramuscular Given 08/13/18 0353)     I do not believe this is infectious.  Likely allergic vs. GERD. Follow up with your PMD for referral to specialist further testing.    Final Clinical Impressions(s) / ED Diagnoses   Final diagnoses:  Cough   Return for intractable cough, coughing up blood,fevers >100.4 unrelieved by medication, shortness of breath, intractable vomiting, chest pain, shortness of breath, weakness,numbness, changes in speech, facial asymmetry,abdominal pain, passing out,Inability to tolerate liquids or food, cough, altered mental status or any concerns. No signs of systemic illness or infection. The patient is nontoxic-appearing on exam and vital signs are within normal limits.   I have reviewed the triage vital signs and the nursing notes. Pertinent labs &imaging results that were available during my care of the patient were reviewed by me and considered in my medical decision making (see chart for details).  After history, exam, and medical workup I feel the patient has been appropriately medically screened and is safe for  discharge home. Pertinent diagnoses were discussed with the patient. Patient was given return precautions  ED Discharge Orders         Ordered    famotidine (PEPCID) 20 MG tablet  2 times daily     08/13/18 0415           Jessamy Torosyan, MD 08/13/18 8588

## 2018-08-13 NOTE — ED Triage Notes (Addendum)
Reports cough for 3 weeks, seen by our ED and PCP for same. Came to ED tonight d/t coughing fit that woke her up and caused pain in ribs. Finished zpack from PCP yesterday. States cough only affects her at night. No fevers. Also concerned for second hand smoke exposure, states she thinks it's contributing to s/s. States "drainage" to R ear. Reports mucinex prior to arrival. C/o hx of GERD s/s.

## 2018-08-25 ENCOUNTER — Ambulatory Visit: Payer: 59 | Admitting: Gynecology

## 2018-08-25 ENCOUNTER — Other Ambulatory Visit: Payer: 59

## 2018-08-28 ENCOUNTER — Ambulatory Visit: Payer: 59 | Admitting: Gynecology

## 2018-08-28 ENCOUNTER — Other Ambulatory Visit: Payer: 59

## 2018-09-24 ENCOUNTER — Telehealth: Payer: Self-pay | Admitting: *Deleted

## 2018-09-24 NOTE — Telephone Encounter (Signed)
Patient called requesting referral to Dr. Benjie Karvonen at [facility] for possible surgery to remove cyst. Okay for referral?

## 2018-09-24 NOTE — Telephone Encounter (Signed)
Notes faxed to Dr.Mody [phone], patient informed.

## 2018-09-24 NOTE — Telephone Encounter (Signed)
Okay to refer? 

## 2018-10-02 ENCOUNTER — Emergency Department (HOSPITAL_COMMUNITY): Payer: 59

## 2018-10-02 ENCOUNTER — Emergency Department (HOSPITAL_COMMUNITY)
Admission: EM | Admit: 2018-10-02 | Discharge: 2018-10-02 | Disposition: A | Discharge status: Home / Self Care | Payer: 59 | Attending: Emergency Medicine | Admitting: Emergency Medicine

## 2018-10-02 DIAGNOSIS — E119 Type 2 diabetes mellitus without complications: Secondary | ICD-10-CM | POA: Insufficient documentation

## 2018-10-02 DIAGNOSIS — Z7984 Long term (current) use of oral hypoglycemic drugs: Secondary | ICD-10-CM | POA: Diagnosis not present

## 2018-10-02 DIAGNOSIS — K224 Dyskinesia of esophagus: Secondary | ICD-10-CM | POA: Insufficient documentation

## 2018-10-02 DIAGNOSIS — E860 Dehydration: Secondary | ICD-10-CM | POA: Diagnosis not present

## 2018-10-02 DIAGNOSIS — Z79899 Other long term (current) drug therapy: Secondary | ICD-10-CM | POA: Diagnosis not present

## 2018-10-02 DIAGNOSIS — I10 Essential (primary) hypertension: Secondary | ICD-10-CM | POA: Diagnosis not present

## 2018-10-02 DIAGNOSIS — F419 Anxiety disorder, unspecified: Secondary | ICD-10-CM | POA: Diagnosis not present

## 2018-10-02 DIAGNOSIS — R0989 Other specified symptoms and signs involving the circulatory and respiratory systems: Secondary | ICD-10-CM | POA: Diagnosis present

## 2018-10-02 DIAGNOSIS — Z87891 Personal history of nicotine dependence: Secondary | ICD-10-CM | POA: Diagnosis not present

## 2018-10-02 DIAGNOSIS — E876 Hypokalemia: Secondary | ICD-10-CM | POA: Insufficient documentation

## 2018-10-02 LAB — COMPREHENSIVE METABOLIC PANEL
ALT: 19 U/L (ref 0–44)
AST: 23 U/L (ref 15–41)
Albumin: 3.7 g/dL (ref 3.5–5.0)
Alkaline Phosphatase: 72 U/L (ref 38–126)
Anion gap: 16 — ABNORMAL HIGH (ref 5–15)
BUN: 14 mg/dL (ref 6–20)
CO2: 30 mmol/L (ref 22–32)
Calcium: 9.2 mg/dL (ref 8.9–10.3)
Chloride: 96 mmol/L — ABNORMAL LOW (ref 98–111)
Creatinine, Ser: 1.22 mg/dL — ABNORMAL HIGH (ref 0.44–1.00)
GFR calc Af Amer: >60 mL/min (ref >60)
GFR calc non Af Amer: 52 mL/min — ABNORMAL LOW (ref >60)
Glucose, Bld: 151 mg/dL — ABNORMAL HIGH (ref 70–99)
Potassium: 3.1 mmol/L — ABNORMAL LOW (ref 3.5–5.1)
Sodium: 142 mmol/L (ref 135–145)
Total Bilirubin: 0.3 mg/dL (ref 0.3–1.2)
Total Protein: 7.2 g/dL (ref 6.5–8.1)

## 2018-10-02 LAB — LIPASE, BLOOD: Lipase: 29 U/L (ref 11–51)

## 2018-10-02 LAB — CBC WITH DIFFERENTIAL/PLATELET
Abs Immature Granulocytes: 0.02 10*3/uL (ref 0.00–0.07)
Basophils Absolute: 0 10*3/uL (ref 0.0–0.1)
Basophils Relative: 0 %
Eosinophils Absolute: 0.4 10*3/uL (ref 0.0–0.5)
Eosinophils Relative: 4 %
HCT: 36.7 % (ref 36.0–46.0)
Hemoglobin: 11.7 g/dL — ABNORMAL LOW (ref 12.0–15.0)
Immature Granulocytes: 0 %
Lymphocytes Relative: 30 %
Lymphs Abs: 2.7 10*3/uL (ref 0.7–4.0)
MCH: 28.5 pg (ref 26.0–34.0)
MCHC: 31.9 g/dL (ref 30.0–36.0)
MCV: 89.3 fL (ref 80.0–100.0)
Monocytes Absolute: 0.6 10*3/uL (ref 0.1–1.0)
Monocytes Relative: 7 %
Neutro Abs: 5.3 10*3/uL (ref 1.7–7.7)
Neutrophils Relative %: 59 %
Platelets: 363 10*3/uL (ref 150–400)
RBC: 4.11 MIL/uL (ref 3.87–5.11)
RDW: 13.2 % (ref 11.5–15.5)
WBC: 9 10*3/uL (ref 4.0–10.5)
nRBC: 0 % (ref 0.0–0.2)

## 2018-10-02 LAB — I-STAT BETA HCG BLOOD, ED (MC, WL, AP ONLY): I-stat hCG, quantitative: <5 m[IU]/mL (ref <5)

## 2018-10-02 MED ORDER — GLUCAGON HCL RDNA (DIAGNOSTIC) 1 MG IJ SOLR
1.0000 mg | Freq: Once | INTRAMUSCULAR | Status: AC
  Administered 2018-10-02: 12:00:00 1 mg via INTRAVENOUS
  Filled 2018-10-02: qty 1

## 2018-10-02 MED ORDER — POTASSIUM CHLORIDE 10 MEQ/100ML IV SOLN
10.0000 meq | Freq: Once | INTRAVENOUS | Status: AC
  Administered 2018-10-02: 10 meq via INTRAVENOUS
  Filled 2018-10-02: qty 100

## 2018-10-02 MED ORDER — LORAZEPAM 2 MG/ML IJ SOLN
1.0000 mg | Freq: Once | INTRAMUSCULAR | Status: AC
  Administered 2018-10-02: 12:00:00 1 mg via INTRAVENOUS
  Filled 2018-10-02: qty 1

## 2018-10-02 MED ORDER — SODIUM CHLORIDE 0.9 % IV BOLUS
1000.0000 mL | Freq: Once | INTRAVENOUS | Status: AC
  Administered 2018-10-02: 14:00:00 1000 mL via INTRAVENOUS

## 2018-10-02 MED ORDER — HYDROXYZINE HCL 25 MG PO TABS: 25.0000 mg | ORAL_TABLET | Freq: Four times a day (QID) | ORAL | 0 refills | Status: AC

## 2018-10-02 MED ORDER — POTASSIUM CHLORIDE CRYS ER 20 MEQ PO TBCR: 20.0000 meq | EXTENDED_RELEASE_TABLET | Freq: Every day | ORAL | 0 refills | Status: DC

## 2018-10-02 NOTE — ED Notes (Signed)
Patient transported to X-ray 

## 2018-10-02 NOTE — Discharge Instructions (Signed)
I suspect that you likely had esophageal spasms today worsening your known esophageal strictures.  I am reassured that you have been able to tolerate your secretions and keep down water.  Please make sure you are drinking plenty of water and maintain a soft diet until you are able to see your GI doctor for planned esophageal dilation.  I do think that your anxiety is likely worsening the symptoms, phentermine which you were on previously can certainly exacerbate this.  Use hydroxyzine as needed for anxiety.  Your labs showed that your kidney function was slightly elevated suggestive of dehydration and your potassium was low.  Please make sure you are drinking plenty of water, take potassium tablets for the next 3 days.  You need to have your kidney function and potassium rechecked within the week with your primary doctor.  Return to the emergency department if you feel that food is stuck and you are not able to swallow water or tolerate your secretions, or any other new or concerning symptoms occur.

## 2018-10-02 NOTE — ED Notes (Signed)
EDP at bedside  

## 2018-10-02 NOTE — ED Notes (Signed)
Pt d/c home per MD order. Discharge summary reviewed with pt, pt verbalizes understanding. Voicing no complaints at discharge, Ambulatory off unit. Pt has discharge ride home.

## 2018-10-02 NOTE — ED Notes (Signed)
Pt provided water, tolerated well, EDP made aware

## 2018-10-02 NOTE — ED Notes (Signed)
Visitor at bedside.

## 2018-10-02 NOTE — ED Provider Notes (Signed)
[facility] DEPT Provider Note   CSN: [id number] Arrival date & time: 10/02/18  1056     History   Chief Complaint Chief Complaint  Patient presents with  . FOOD STUCK IN THROAT    HPI Holly Wells is a 48 y.o. female.     Holly Wells is a 48 y.o. female with a history of hypertension, diabetes, GERD, kidney stones and anxiety, who presents via EMS with concern that she "stopped".  Patient reports that at 930 this morning she ate an Egg McMuffin, she reports that she feels like it, she has not tried anything else since then, has not had any nausea or vomiting but reports pressure in her throat and esophagus that feels like it is stuck.  Patient reports she has had this sensation before but this is the most severe.  She reports it feels like a pressure radiating into her chest.  No associated abdominal pain.  Reports she has a known history of GERD and is scheduled to have an esophageal dilatation in September with [facility].  Patient reports that she has also been on a weight loss program at [facility], was previously on Saxenda, but recently was switched to phentermine due to insurance issues she reports since then her anxiety has been much much worse and over the past few days she has been having frequent anxiety attacks, she reports this worsens her symptoms of feeling like food gets stuck.  She reports that when she is anxious she feels like everything gets stuck in her throat.  Called her doctor yesterday and decided to withdraw from the weight loss program and stop phentermine, took last dose yesterday evening.  She reports that she had a panic attack today when symptoms began.  She still reports feeling very anxious, and reports that her extremities feel tingly and she feels like it is difficult for her to move.  She has not previously taken any medication for anxiety, but reports it is gotten much more severe since being on phentermine.      Past Medical History:  Diagnosis Date  . Anxiety   . Diabetes mellitus   . 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 Fibroid tumor   . Hypertension   . Kidney stone   . Ovarian cyst   . Seasonal allergies     Patient Active Problem List   Diagnosis Date Noted  . Left ovarian cyst 12/19/2014  . LLQ pain 12/19/2014  . Labial abscess 12/19/2014  . Fallopian tube disorder   . MENORRHAGIA 12/22/2007  . HIDRADENITIS SUPPURATIVA 06/06/2007  . HYPERLIPIDEMIA 05/22/2007  . DIABETES MELLITUS II, UNCOMPLICATED [id number]  . OBESITY, NOS 04/04/2006  . DEPRESSION, MAJOR, RECURRENT 04/04/2006  . PANIC ATTACKS 04/04/2006  . HYPERTENSION, BENIGN SYSTEMIC 04/04/2006  . GASTROESOPHAGEAL REFLUX, NO ESOPHAGITIS 04/04/2006    Past Surgical History:  Procedure Laterality Date  . APPENDECTOMY    . CHOLECYSTECTOMY    . EXPLORATORY LAPAROTOMY W/ BOWEL RESECTION    . Stomach and bowel surg    . TONSILLECTOMY       OB History    Gravida  2   Para  2   Term  2   Preterm      AB      Living  2     SAB      TAB      Ectopic      Multiple  Live Births               Home Medications    Prior to Admission medications   Medication Sig Start Date End Date Taking? Authorizing Provider  FLOVENT HFA 110 MCG/ACT inhaler Inhale 1 puff into the lungs 2 (two) times daily. 08/30/18  Yes [provider]  fluticasone (FLONASE) 50 MCG/ACT nasal spray Place 2 sprays into both nostrils daily. 07/28/18  Yes Palumbo, April, MD  INVOKANA 300 MG TABS tablet Take 300 mg by mouth daily. 07/21/18  Yes [provider]  lisinopril-hydrochlorothiazide (PRINZIDE,ZESTORETIC) 20-25 MG per tablet Take 1 tablet by mouth daily.     Yes [provider]  loratadine (CLARITIN) 10 MG tablet Take 10 mg by mouth daily.   Yes [provider]  metFORMIN (GLUCOPHAGE) 1000 MG tablet Take 1,000 mg by mouth daily with breakfast.    Yes [provider]  traMADol (ULTRAM) 50 MG tablet 1-2 tabs every 6 hours prn moderate pain Patient taking differently: Take 50 mg by mouth every 6 (six) hours as needed for moderate pain or severe pain. 1-2 tabs every 6 hours prn moderate pain 06/24/18  Yes Princess Bruins, MD  benzonatate (TESSALON) 100 MG capsule Take 1 capsule (100 mg total) by mouth every 8 (eight) hours. Patient not taking: Reported on 10/02/2018 07/28/18   Palumbo, April, MD  cetirizine-pseudoephedrine (ZYRTEC-D) 5-120 MG tablet Take 1 tablet by mouth daily. Patient not taking: Reported on 10/02/2018 07/28/18   Palumbo, April, MD  famotidine (PEPCID) 20 MG tablet Take 1 tablet (20 mg total) by mouth 2 (two) times daily. Patient not taking: Reported on 10/02/2018 08/13/18   Palumbo, April, MD  guaiFENesin (MUCINEX) 600 MG 12 hr tablet Take 1 tablet (600 mg total) by mouth 2 (two) times daily. Patient not taking: Reported on 10/02/2018 07/28/18   Palumbo, April, MD  hydrocodone-ibuprofen (VICOPROFEN) 5-200 MG tablet Take 1 tablet by mouth every 8 (eight) hours as needed for pain. Patient not taking: Reported on 10/02/2018 06/25/18   Huel Cote, NP  hydrOXYzine (ATARAX/VISTARIL) 25 MG tablet Take 1 tablet (25 mg total) by mouth every 6 (six) hours. 10/02/18   Jacqlyn Larsen, PA-C  potassium chloride SA (K-DUR) 20 MEQ tablet Take 1 tablet (20 mEq total) by mouth daily. 10/02/18   Jacqlyn Larsen, PA-C    Family History Family History  Problem Relation Age of Onset  . Hypertension Mother   . Thyroid cancer Mother   . Diabetes Maternal Grandmother   . Hypertension Maternal Grandmother   . Breast cancer Maternal Grandmother 78  . Diabetes Maternal Grandfather   . Hypertension Maternal Grandfather   . Deep vein thrombosis Daughter     Social History Social History   Tobacco Use  . Smoking status: Former Research scientist (life sciences)  . Smokeless tobacco: Never Used  Substance Use Topics  . Alcohol use: Yes    Comment: social  . Drug use: No      Allergies   Bactrim [sulfamethoxazole-trimethoprim]   Review of Systems Review of Systems  Constitutional: Negative for chills and fever.  HENT: Positive for trouble swallowing. Negative for sore throat.   Respiratory: Negative for cough and shortness of breath.   Cardiovascular: Negative for chest pain.  Gastrointestinal: Negative for abdominal pain, constipation, diarrhea, nausea and vomiting.  Genitourinary: Negative for dysuria.  Musculoskeletal: Negative for arthralgias and myalgias.  Skin: Negative for color change and rash.  Neurological: Negative for dizziness, syncope and light-headedness.  Psychiatric/Behavioral: The  patient is nervous/anxious.      Physical Exam Updated Vital Signs BP (!) 157/115   Pulse 79   Temp 98.1 F (36.7 C) (Oral)   Resp 20   Ht 5\' 4"  (1.626 m)   Wt 86.2 kg   SpO2 100%   BMI 32.61 kg/m   Physical Exam Vitals signs and nursing note reviewed.  Constitutional:      General: She is not in acute distress.    Appearance: Normal appearance. She is well-developed. She is obese. She is not diaphoretic.     Comments: Patient is tearful, appears very anxious, in no acute distress.  HENT:     Head: Normocephalic and atraumatic.     Mouth/Throat:     Mouth: Mucous membranes are moist.     Pharynx: Oropharynx is clear.     Comments: Posterior oropharynx clear and mucous membranes moist, there is no erythema, edema or tonsillar exudates, uvula midline, normal phonation,, tolerating secretions without difficulty. Eyes:     General:        Right eye: No discharge.        Left eye: No discharge.     Pupils: Pupils are equal, round, and reactive to light.  Neck:     Musculoskeletal: Neck supple.  Cardiovascular:     Rate and Rhythm: Normal rate and regular rhythm.     Pulses: Normal pulses.     Heart sounds: Normal heart sounds. No murmur. No friction rub. No gallop.   Pulmonary:     Effort: Pulmonary effort is normal. No respiratory  distress.     Breath sounds: Normal breath sounds. No wheezing or rales.     Comments: Respirations equal and unlabored, patient able to speak in full sentences, lungs clear to auscultation bilaterally Abdominal:     General: Bowel sounds are normal. There is no distension.     Palpations: Abdomen is soft. There is no mass.     Tenderness: There is no abdominal tenderness. There is no guarding.     Comments: Abdomen soft, nondistended, nontender to palpation in all quadrants without guarding or peritoneal signs  Musculoskeletal:        General: No deformity.  Skin:    General: Skin is warm and dry.     Capillary Refill: Capillary refill takes less than 2 seconds.  Neurological:     Mental Status: She is alert.     Coordination: Coordination normal.     Comments: Speech is clear, able to follow commands CN III-XII intact Normal strength in upper and lower extremities bilaterally including dorsiflexion and plantar flexion, strong and equal grip strength Sensation normal to light and sharp touch Moves extremities without ataxia, coordination intact  Psychiatric:        Mood and Affect: Mood is anxious.      ED Treatments / Results  Labs (all labs ordered are listed, but only abnormal results are displayed) Labs Reviewed  COMPREHENSIVE METABOLIC PANEL - Abnormal; Notable for the following components:      Result Value   Potassium 3.1 (*)    Chloride 96 (*)    Glucose, Bld 151 (*)    Creatinine, Ser 1.22 (*)    GFR calc non Af Amer 52 (*)    Anion gap 16 (*)    All other components within normal limits  CBC WITH DIFFERENTIAL/PLATELET - Abnormal; Notable for the following components:   Hemoglobin 11.7 (*)    All other components within normal limits  LIPASE, BLOOD  I-STAT BETA HCG BLOOD, ED ([facility], [facility], [facility] ONLY)    EKG None  Radiology Dg Abdomen Acute W/chest  Result Date: 10/02/2018 CLINICAL DATA:  Abdominal pain EXAM: DG ABDOMEN ACUTE W/ 1V CHEST COMPARISON:  Abdomen  series December 04, 2011 FINDINGS: PA chest: Lungs are clear. Heart size and pulmonary vascularity are normal. No adenopathy. Supine and upright abdomen: There is a paucity of bowel gas with most bowel loops fluid-filled. There is no bowel dilatation or air-fluid level to suggest bowel obstruction. No free air. There are surgical clips in the right upper quadrant. There are phleboliths in the pelvis. IMPRESSION: Paucity of bowel gas noted. While this finding may be seen normally, it does suggest early ileus or enteritis. Bowel obstruction not felt to be likely. No free air. Lungs clear. Electronically Signed   By: Lowella Grip III M.D.   On: 10/02/2018 12:14    Procedures Procedures (including critical care time)  Medications Ordered in ED Medications  LORazepam (ATIVAN) injection 1 mg (1 mg Intravenous Given 10/02/18 1217)  glucagon (human recombinant) (GLUCAGEN) injection 1 mg (1 mg Intravenous Given 10/02/18 1213)  sodium chloride 0.9 % bolus 1,000 mL (0 mLs Intravenous Stopped 10/02/18 1500)  potassium chloride 10 mEq in 100 mL IVPB (0 mEq Intravenous Stopped 10/02/18 1435)     Initial Impression / Assessment and Plan / ED Course  I have reviewed the triage vital signs and the nursing notes.  Pertinent labs & imaging results that were available during my care of the patient were reviewed by me and considered in my medical decision making (see chart for details).  48 year old female with history of GERD, esophageal strictures and anxiety,  who presents to the emergency department with concern for possible esophageal impaction.  She reports it feels like food has been stuck in her throat since eating and Egg McMuffin this morning.  She has not been vomiting and has been tolerating secretions, but has not tried to eat or drink anything else.  No prior history of esophageal impaction, but she does report a frequent location of getting stuck, today worse than usual.  She reports she became very  anxious and was forcing her symptoms.  Worsening anxiety since patient was switched to phentermine for weight loss, decided to stop medication yesterday.  Reports feeling like there is pressure in her throat that radiates into her chest.  No shortness of breath.  No abdominal pain, no vomiting.  On arrival patient appears extremely anxious and is tearful, but is otherwise well-appearing and in no acute distress.  She is tolerating secretions without difficulty, throat is clear doubt complete esophageal obstruction, patient could have partial obstruction versus esophageal spasms causing her symptoms.  I think that anxiety is likely worsening these.  Will give dose of IV glucagon, and IV Ativan.  Will check basic labs, EKG and acute abdominal series.  Labs show no leukocytosis, stable hemoglobin.  Hypokalemia at 3.1, creatinine is slightly elevated at 1.2 suggesting likely dehydration.  Mild anion gap of 16, suspected in setting of dehydration.  Normal lipase.  Negative pregnancy.  EKG with normal sinus rhythm.  Acute abdominal series shows clear lungs, no active cardiopulmonary disease.  Shows a paucity of bowel gas, no free air or suggested obstruction, could be normal versus early enteritis, or ileus.  Given the patient is not having any focal abdominal pain, no nausea, vomiting or diarrhea I feel this is more likely normal.  Will reevaluate after symptomatic treatment.  After Ativan and  glucagon patient is tolerating water with no vomiting and no feelings of obstruction.  She has calmed down significantly and reports that her anxiety has significantly improved.  IV fluid bolus and IV potassium given.  Patient monitored in the ED with no vomiting, continues to be able to tolerate p.o. fluids.  Will discharge home with 3 days of potassium supplementation, hydroxyzine for anxiety.  I have instructed patient to follow-up with her GI doctor.  PCP follow-up within the week for recheck of creatinine and potassium.   Return precautions discussed.  Patient expresses understanding and agreement with plan.  Discharged home in good condition.   Patient discussed with Dr. Sedonia Small, who saw patient as well and agrees with plan.   Final Clinical Impressions(s) / ED Diagnoses   Final diagnoses:  Esophageal spasm  Dehydration  Hypokalemia  Anxiety    ED Discharge Orders         Ordered    hydrOXYzine (ATARAX/VISTARIL) 25 MG tablet  Every 6 hours     10/02/18 1441    potassium chloride SA (K-DUR) 20 MEQ tablet  Daily,   Status:  Discontinued     10/02/18 1441    potassium chloride SA (K-DUR) 20 MEQ tablet  Daily     10/02/18 1442           Jacqlyn Larsen, [place] 10/02/18 1704    Maudie Flakes, MD 10/09/18 [phone]

## 2018-10-02 NOTE — ED Triage Notes (Signed)
Pt to ED via EMS from home, pt c/o eating a egg mcmuffin from [employer] at 0930 am and feels it is still in her throat. Pt is able to speak in full sentences, pt obviously anxious, hx panic attacks. Pt is scheduled in Sept for sx to have her esophagus stretched. No meds given by EMS

## 2018-10-27 ENCOUNTER — Telehealth: Payer: Self-pay | Admitting: *Deleted

## 2018-10-27 NOTE — Telephone Encounter (Signed)
Called and spoke with the patient, scheduled an appt for 10/9. Gave the instructions for parking and visitors

## 2018-10-28 ENCOUNTER — Encounter: Payer: Self-pay | Admitting: Gynecology

## 2018-10-29 ENCOUNTER — Other Ambulatory Visit: Payer: Self-pay | Admitting: Obstetrics & Gynecology

## 2018-10-29 DIAGNOSIS — N83201 Unspecified ovarian cyst, right side: Secondary | ICD-10-CM

## 2018-11-04 ENCOUNTER — Other Ambulatory Visit: Payer: Self-pay | Admitting: Obstetrics & Gynecology

## 2018-11-06 ENCOUNTER — Ambulatory Visit
Admission: RE | Admit: 2018-11-06 | Discharge: 2018-11-06 | Disposition: A | Discharge status: Home / Self Care | Payer: 59 | Source: Ambulatory Visit | Attending: Obstetrics & Gynecology | Admitting: Obstetrics & Gynecology

## 2018-11-06 DIAGNOSIS — N83201 Unspecified ovarian cyst, right side: Secondary | ICD-10-CM

## 2018-11-06 MED ORDER — IOPAMIDOL (ISOVUE-300) INJECTION 61%
125.0000 mL | Freq: Once | INTRAVENOUS | Status: AC | PRN
  Administered 2018-11-06: 125 mL via INTRAVENOUS

## 2018-11-11 ENCOUNTER — Telehealth: Payer: Self-pay | Admitting: *Deleted

## 2018-11-11 NOTE — Telephone Encounter (Signed)
Holly Wells called from Dr. Denman George office to see if CA-125 from [name] on 07/09/18 was completed. I called and explanined patient never had lab drawn, she left office and did not stop by the lab.

## 2018-11-14 ENCOUNTER — Encounter: Payer: Self-pay | Admitting: Gynecologic Oncology

## 2018-11-14 ENCOUNTER — Other Ambulatory Visit: Payer: Self-pay | Admitting: Gynecologic Oncology

## 2018-11-14 ENCOUNTER — Inpatient Hospital Stay (HOSPITAL_BASED_OUTPATIENT_CLINIC_OR_DEPARTMENT_OTHER): Payer: No Typology Code available for payment source | Admitting: Gynecologic Oncology

## 2018-11-14 ENCOUNTER — Inpatient Hospital Stay: Payer: 59 | Attending: Gynecologic Oncology

## 2018-11-14 ENCOUNTER — Other Ambulatory Visit: Payer: Self-pay

## 2018-11-14 VITALS — BP 116/78 | HR 91 | Temp 98.3°F | Resp 18 | Ht 65.0 in | Wt 204.2 lb

## 2018-11-14 DIAGNOSIS — I10 Essential (primary) hypertension: Secondary | ICD-10-CM | POA: Insufficient documentation

## 2018-11-14 DIAGNOSIS — Z794 Long term (current) use of insulin: Secondary | ICD-10-CM

## 2018-11-14 DIAGNOSIS — E119 Type 2 diabetes mellitus without complications: Secondary | ICD-10-CM | POA: Insufficient documentation

## 2018-11-14 DIAGNOSIS — N83201 Unspecified ovarian cyst, right side: Secondary | ICD-10-CM | POA: Diagnosis not present

## 2018-11-14 DIAGNOSIS — F1190 Opioid use, unspecified, uncomplicated: Secondary | ICD-10-CM

## 2018-11-14 DIAGNOSIS — R102 Pelvic and perineal pain: Secondary | ICD-10-CM

## 2018-11-14 DIAGNOSIS — F419 Anxiety disorder, unspecified: Secondary | ICD-10-CM | POA: Insufficient documentation

## 2018-11-14 DIAGNOSIS — Z7984 Long term (current) use of oral hypoglycemic drugs: Secondary | ICD-10-CM | POA: Insufficient documentation

## 2018-11-14 DIAGNOSIS — N736 Female pelvic peritoneal adhesions (postinfective): Secondary | ICD-10-CM | POA: Insufficient documentation

## 2018-11-14 DIAGNOSIS — G8929 Other chronic pain: Secondary | ICD-10-CM | POA: Insufficient documentation

## 2018-11-14 DIAGNOSIS — Z79899 Other long term (current) drug therapy: Secondary | ICD-10-CM | POA: Insufficient documentation

## 2018-11-14 DIAGNOSIS — Z6834 Body mass index (BMI) 34.0-34.9, adult: Secondary | ICD-10-CM | POA: Insufficient documentation

## 2018-11-14 DIAGNOSIS — E669 Obesity, unspecified: Secondary | ICD-10-CM | POA: Insufficient documentation

## 2018-11-14 DIAGNOSIS — Z791 Long term (current) use of non-steroidal anti-inflammatories (NSAID): Secondary | ICD-10-CM | POA: Insufficient documentation

## 2018-11-14 DIAGNOSIS — Z7951 Long term (current) use of inhaled steroids: Secondary | ICD-10-CM | POA: Insufficient documentation

## 2018-11-14 LAB — HEMOGLOBIN A1C
Hgb A1c MFr Bld: 8 % — ABNORMAL HIGH (ref 4.8–5.6)
Mean Plasma Glucose: 182.9 mg/dL

## 2018-11-14 MED ORDER — NEOMYCIN SULFATE 500 MG PO TABS: ORAL_TABLET | ORAL | 0 refills | Status: DC

## 2018-11-14 MED ORDER — ERYTHROMYCIN BASE 500 MG PO TABS: ORAL_TABLET | ORAL | 0 refills | Status: DC

## 2018-11-14 NOTE — Addendum Note (Signed)
Addended by: [name] D on: 11/14/2018 12:54 PM   Modules accepted: Orders

## 2018-11-14 NOTE — Addendum Note (Signed)
Addended by: [name] D on: 11/14/2018 01:18 PM   Modules accepted: Orders

## 2018-11-14 NOTE — Progress Notes (Signed)
Consult Note: Gyn-Onc  Consult was requested by Dr. Benjie Karvonen for the evaluation of Holly Wells 48 y.o. female  CC:  Chief Complaint  Patient presents with  . Right ovarian cyst    Assessment/Plan:  Holly Wells  is a 48 y.o.  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   The patient cyst is symptomatic.  It is limiting her quality of life.  There are no good nonsurgical alternatives to attempt to ameliorate her symptoms.  She requires chronic opioids for this pain.  I discussed with her that surgery would be possible to attempt.  I explained that she is at high risk for postoperative and intraoperative surgical complications which is why surgeons have elected to not offer her surgery in the recent past.  I discussed with her that excepting a surgical plan on her part means excepting that this risk of complications is inevitable.  I also explained that due to her history of chronic abdominal and pelvic pain and opioid use she is at increased risk for persistent chronic pain postoperatively and her symptoms may not be completely ameliorated with surgery.  I explained that we would not be able to prescribe more than 10 tablets of a prescription opioid or opioid-like medication such as tramadol postoperatively.  I explained that this was a condition of my performing surgery is that we would not prescribe refills on opioid prescriptions.  I explained that surgical approach would begin with a robotic approach.  If adhesive disease is able to be approached and resolved with a minimally invasive route we will proceed and complete the surgery through small incisions and this may establish an outpatient procedure for the patient.  It would also decrease her risk for postoperative infection and other sequelae.  If however at the time of surgery is deemed that her adhesive disease precludes a safe minimally  invasive approach we will convert to laparotomy.  The plan surgery would be a right salpingo-oophorectomy.  I explained that sometimes adhesive disease means that hysterectomy and BSO needs to be performed at the time of a unilateral oophorectomy because of fused pelvic organs.  She is in agreement that if this is surgically necessary for technical reasons that we will proceed with total hysterectomy with BSO.  If it is safe to approach the left ovarian cyst we will remove this but plan to keep the left ovary as she is premenopausal and age.  If it is not safe to access the left ovarian cyst to adhesive disease we will not treat this cyst at the time of surgery.  I explained that pelvic adhesive disease frequently means that intestinal organs are fused with the gynecologic structures.  This may require bowel resection in order to achieve removal of the ovary.  I explained that bowel resection is associated with risks of stricture, leak, perforation, reoperation, and stoma formation either temporary or permanent.  We will provide her with a bowel prep preoperatively to decrease the risk of postoperative infection and to maximize the chance that a left-sided colon resection could be reanastomosed primarily.  We will provide her with Entereg preoperatively to decrease the risk of postoperative ileus in case bowel resections performed.  We will obtain preoperative hemoglobin A1c to assess for control of diabetes.  If this is greater than 7% her surgery will be postponed until it has been optimized.  Finally I explained that if at the time of  surgery we do not deem that the surgical risk is worth the benefit of a complete resection, if it is amenable to drain the cyst surgically we will do so which may relieve some of her mass-effect symptoms but carries with it and increased risk of cyst re-formation.   HPI: Holly Wells is a 48 year old P2 who is seen in consutlation at the request of Dr Benjie Karvonen for  evaluation of an 11cm right ovarian cyst.   The patient has a medical history of a prior complex surgery performed in 2007.  I reviewed the operative note and discharge summary from that admission.  The patient was admitted between October 4 and November 21, 2005.  She was admitted with an abdominal wall abscess.  An exploratory laparotomy was performed via right lower quadrant incision.  It encountered the finding of an abdominal wall abscess and chronic abscess immediately adjacent to the cecum and possibly communicating with the cecum but no appendicitis.  The surgery included an incidental appendectomy, resection of the chronic inflammatory abscess, and oversewing of the cecum where it appeared to possibly communicate.  The surgeon, Dr. Rise Patience, felt strongly that this was secondary to a foreign body that might have been ingested and eroded through the cecal wall.  He felt it might be a fishbone.  The patient had an open incision and this required wet-to-dry dressings for a prolonged period of time.  Prior to that she had an open cholecystectomy performed in 1991.  In 2015 in 2012 she was diagnosed with a right ovarian cyst.  It was unilocular.  It became symptomatic.  She was evaluated by gynecologist, and referred to a GYN oncologist at [facility].  Due to her complex prior surgical history they elected to proceed with no surgical intervention due to concern for operative injury or complex postoperative recovery.  Additionally Ca1 25 drawn on October 23, 2010 was normal at 5.4.  She is continued to be treated with pain medications including tramadol and occasionally morphine tablets over the years due to the pain from the cyst.  She presented to me for consultation reporting symptoms of abdominal bloating, and intermittent right abdominal and pelvic pains that are so severe that she requires time off of work and to take morphine pills and tramadol for this pain.  She takes about 4 tablets of  tramadol per day.  She reports that the pain shoots down her right leg.  It wraps around her back.  Occasionally she feels that shooting down to her vagina.  She cannot exercise or do other activities of vigorous nature due to the pain.  It is quality of life limiting.  She has FMLA to take intermittent days off due to the pain.  She reports that it is worse during menstrual cycles.  Menstrual cycles are otherwise regular and she denies menorrhagia.  Her most recent imaging was a CT scan of the chest abdomen and pelvis that was performed on November 06, 2018.  It revealed a uterus grossly normal in appearance with a 4 cm benign appearing cyst in the left adnexa most likely physiologic.  There is a cystic lesion in the right adnexa measuring 9.4 x 7.3 cm and showing several small mural nodules which show high attenuation and could be due to contrast enhancement or calcification.  It had increased in size from 5.7 x 4.5 cm on the prior study which was performed on February 27, 2013, and is suspicious for cystic ovarian neoplasm.  There was  no peritoneal thickening ascites or carcinomatosis seen.  Her gynecologist did not feel that surgically she could be managed without the assistance of either GYN oncology or colorectal surgery.  The patient's medical history significant for obesity.  She has a BMI of 34 kilograms per metered squared.  She has type II but diabetes mellitus on metformin and Invokana.  She reports her last hemoglobin A1c in the spring 2020 was 6.7%.  She also reported hypertension.  She denies tobacco or alcohol use.  She denies illicit substance use.  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  Currently due to the coronavirus she works from home.  She lives with HER-2 children who are adult age.  Current Meds:  Outpatient Encounter Medications as of 11/14/2018  Medication Sig  . cetirizine-pseudoephedrine (ZYRTEC-D) 5-120 MG tablet Take 1 tablet by mouth daily.  Marland Kitchen  FLOVENT HFA 110 MCG/ACT inhaler Inhale 1 puff into the lungs 2 (two) times daily.  . fluticasone (FLONASE) 50 MCG/ACT nasal spray Place 2 sprays into both nostrils daily.  . hydrocodone-ibuprofen (VICOPROFEN) 5-200 MG tablet Take 1 tablet by mouth every 8 (eight) hours as needed for pain.  . hydrOXYzine (ATARAX/VISTARIL) 25 MG tablet Take 1 tablet (25 mg total) by mouth every 6 (six) hours.  . INVOKANA 300 MG TABS tablet Take 300 mg by mouth daily.  Marland Kitchen lisinopril-hydrochlorothiazide (PRINZIDE,ZESTORETIC) 20-25 MG per tablet Take 1 tablet by mouth daily.    Marland Kitchen loratadine (CLARITIN) 10 MG tablet Take 10 mg by mouth daily.  . metFORMIN (GLUCOPHAGE) 1000 MG tablet Take 1,000 mg by mouth daily with breakfast.   . traMADol (ULTRAM) 50 MG tablet 1-2 tabs every 6 hours prn moderate pain (Patient taking differently: Take 50 mg by mouth every 6 (six) hours as needed for moderate pain or severe pain. 1-2 tabs every 6 hours prn moderate pain)  . [DISCONTINUED] benzonatate (TESSALON) 100 MG capsule Take 1 capsule (100 mg total) by mouth every 8 (eight) hours. (Patient not taking: Reported on 10/02/2018)  . [DISCONTINUED] famotidine (PEPCID) 20 MG tablet Take 1 tablet (20 mg total) by mouth 2 (two) times daily. (Patient not taking: Reported on 10/02/2018)  . [DISCONTINUED] guaiFENesin (MUCINEX) 600 MG 12 hr tablet Take 1 tablet (600 mg total) by mouth 2 (two) times daily. (Patient not taking: Reported on 10/02/2018)  . [DISCONTINUED] potassium chloride SA (K-DUR) 20 MEQ tablet Take 1 tablet (20 mEq total) by mouth daily.   No facility-administered encounter medications on file as of 11/14/2018.     Allergy:  Allergies  Allergen Reactions  . Bactrim [Sulfamethoxazole-Trimethoprim] Itching    Social Hx:   Social History   Socioeconomic History  . Marital status: Single    Spouse name: Not on file  . Number of children: Not on file  . Years of education: Not on file  . Highest education level: Not on file   Occupational History  . Not on file  Social Needs  . Financial resource strain: Not on file  . Food insecurity    Worry: Not on file    Inability: Not on file  . Transportation needs    Medical: Not on file    Non-medical: Not on file  Tobacco Use  . Smoking status: Former Research scientist (life sciences)  . Smokeless tobacco: Never Used  Substance and Sexual Activity  . Alcohol use: Yes    Comment: social  . Drug use: No  . Sexual activity: Yes    Birth control/protection: None  Comment: 1st intercourse 89 yo-5 partners  Lifestyle  . Physical activity    Days per week: Not on file    Minutes per session: Not on file  . Stress: Not on file  Relationships  . Social Herbalist on phone: Not on file    Gets together: Not on file    Attends religious service: Not on file    Active member of club or organization: Not on file    Attends meetings of clubs or organizations: Not on file    Relationship status: Not on file  . Intimate partner violence    Fear of current or ex partner: Not on file    Emotionally abused: Not on file    Physically abused: Not on file    Forced sexual activity: Not on file  Other Topics Concern  . Not on file  Social History Narrative  . Not on file    Past Surgical Hx:  Past Surgical History:  Procedure Laterality Date  . APPENDECTOMY    . CHOLECYSTECTOMY    . EXPLORATORY LAPAROTOMY W/ BOWEL RESECTION    . Stomach and bowel surg    . TONSILLECTOMY      Past Medical Hx:  Past Medical History:  Diagnosis Date  . Anxiety   . Diabetes mellitus   . 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 Fibroid tumor   . Hypertension   . Kidney stone   . Ovarian cyst   . Seasonal allergies     Past Gynecological History:  SVD x 2, 8 year history of right ovarian cyst.  No LMP recorded.  Family Hx:  Family History  Problem Relation Age of Onset  . Hypertension Mother   . Thyroid cancer Mother   . Diabetes Maternal Grandmother   .  Hypertension Maternal Grandmother   . Breast cancer Maternal Grandmother 78  . Diabetes Maternal Grandfather   . Hypertension Maternal Grandfather   . Deep vein thrombosis Daughter     Review of Systems:  Constitutional  Feels well,   ENT Normal appearing ears and nares bilaterally Skin/Breast  No rash, sores, jaundice, itching, dryness Cardiovascular  No chest pain, shortness of breath, or edema  Pulmonary  No cough or wheeze.  Gastro Intestinal  No nausea, vomitting, or diarrhoea. No bright red blood per rectum, + abdominal pain, occasional constipation.  Genito Urinary  No frequency, urgency, dysuria, + pelvic pain Musculo Skeletal  No myalgia, arthralgia, joint swelling or pain  Neurologic  No weakness, numbness, change in gait,  Psychology  No depression, anxiety, insomnia.   Vitals:  Blood pressure 116/78, pulse 91, temperature 98.3 F (36.8 C), temperature source Temporal, resp. rate 18, height 5' 5" (1.651 m), weight 204 lb 4 oz (92.6 kg), SpO2 100 %.  Physical Exam: WD in NAD Neck  Supple NROM, without any enlargements.  Lymph Node Survey No cervical supraclavicular or inguinal adenopathy Cardiovascular  Pulse normal rate, regularity and rhythm. S1 and S2 normal.  Lungs  Clear to auscultation bilateraly, without wheezes/crackles/rhonchi. Good air movement.  Skin  No rash/lesions/breakdown  Psychiatry  Alert and oriented to person, place, and time  Abdomen  Normoactive bowel sounds, abdomen soft, non-tender and obese without evidence of hernia.  There are incision seen on the right abdomen with a subcostal margin incision, a lower punctate incision corresponding to a prior drain, and a right lower quadrant laparotomy incision. Back No CVA tenderness Genito  Urinary  Vulva/vagina: Normal external female genitalia.  No lesions. No discharge or bleeding.  Bladder/urethra:  No lesions or masses, well supported bladder  Vagina: normal  Cervix: Normal  appearing, no lesions.  Uterus: Small,  Minimally mobile, no parametrial involvement or nodularity.  Adnexa: large cystic mass in the right lower quadrant making it difficult to appreciate other structures Rectal  Good tone, no masses no cul de sac nodularity.  Extremities  No bilateral cyanosis, clubbing or edema.  45 minutes was spent in direct face to face counseling with the patient in addition to other time spent reviewing prior records and her CT scan images.  Thereasa Solo, MD  11/14/2018, 10:47 AM

## 2018-11-14 NOTE — Patient Instructions (Signed)
Preparing for your Surgery  Plan for surgery on December 25, 2018 with Dr. Everitt Amber at [place] will be scheduled for a robotic assisted right salpingo-oophorectomy, lysis of adhesions, possible exploratory laparotomy, possible total abdominal hysterectomy, possible bilateral salpingo-oophorectomy, possible bowel resection.   Pre-operative Testing -You will receive a phone call from presurgical testing at [facility] if you have not received a call already to arrange for a pre-operative testing appointment before your surgery.  This appointment normally occurs one to two weeks before your scheduled surgery.   -Bring your insurance card, copy of an advanced directive if applicable, medication list  -At that visit, you will be asked to sign a consent for a possible blood transfusion in case a transfusion becomes necessary during surgery.  The need for a blood transfusion is rare but having consent is a necessary part of your care.     -You should not be taking blood thinners or aspirin at least ten days prior to surgery unless instructed by your surgeon.  -Do not take supplements such as fish oil (omega 3), red yeast rice, tumeric before your surgery.  Day Before Surgery at Home -See bowel prep information below. Avoid carbonated beverages and anything that causes gas.  Your role in recovery Your role is to become active as soon as directed by your doctor, while still giving yourself time to heal.  Rest when you feel tired. You will be asked to do the following in order to speed your recovery:  - Cough and breathe deeply. This helps toclear and expand your lungs and can prevent pneumonia.  - Do mild physical activity. Walking or moving your legs help your circulation and body functions return to normal. A staff member will help you when you try to walk and will provide you with simple exercises. Do not try to get up or walk alone the first time. - Actively manage your  pain. Managing your pain lets you move in comfort. We will ask you to rate your pain on a scale of zero to 10. It is your responsibility to tell your doctor or nurse where and how much you hurt so your pain can be treated.  Special Considerations -If you are diabetic, you may be placed on insulin after surgery to have closer control over your blood sugars to promote healing and recovery.  This does not mean that you will be discharged on insulin.  If applicable, your oral antidiabetics will be resumed when you are tolerating a solid diet.  -Your final pathology results from surgery should be available around one week after surgery and the results will be relayed to you when available.  -Dr. Lahoma Crocker is the surgeon that assists your GYN Oncologist with surgery.  If you end up staying the night, the next day after your surgery you will either see Dr. Denman George or Dr. Lahoma Crocker.  -FMLA forms can be faxed to [phone] and please allow 5-7 business days for completion.  Blood Transfusion Information WHAT IS A BLOOD TRANSFUSION? A transfusion is the replacement of blood or some of its parts. Blood is made up of multiple cells which provide different functions.  Red blood cells carry oxygen and are used for blood loss replacement.  White blood cells fight against infection.  Platelets control bleeding.  Plasma helps clot blood.  Other blood products are available for specialized needs, such as hemophilia or other clotting disorders. BEFORE THE TRANSFUSION  Who gives blood for transfusions?  You may be able to donate blood to be used at a later date on yourself (autologous donation).  Relatives can be asked to donate blood. This is generally not any safer than if you have received blood from a stranger. The same precautions are taken to ensure safety when a relative's blood is donated.  Healthy volunteers who are fully evaluated to make sure their blood is safe. This is  blood bank blood. Transfusion therapy is the safest it has ever been in the practice of medicine. Before blood is taken from a donor, a complete history is taken to make sure that person has no history of diseases nor engages in risky social behavior (examples are intravenous drug use or sexual activity with multiple partners). The donor's travel history is screened to minimize risk of transmitting infections, such as malaria. The donated blood is tested for signs of infectious diseases, such as HIV and hepatitis. The blood is then tested to be sure it is compatible with you in order to minimize the chance of a transfusion reaction. If you or a relative donates blood, this is often done in anticipation of surgery and is not appropriate for emergency situations. It takes many days to process the donated blood. RISKS AND COMPLICATIONS Although transfusion therapy is very safe and saves many lives, the main dangers of transfusion include:   Getting an infectious disease.  Developing a transfusion reaction. This is an allergic reaction to something in the blood you were given. Every precaution is taken to prevent this. The decision to have a blood transfusion has been considered carefully by your caregiver before blood is given. Blood is not given unless the benefits outweigh the risks.  AFTER SURGERY INSTRUCTIONS 11/14/2018  Return to work: 4-6 weeks if applicable  Activity: 1. Be up and out of the bed during the day.  Take a nap if needed.  You may walk up steps but be careful and use the hand rail.  Stair climbing will tire you more than you think, you may need to stop part way and rest.   2. No lifting or straining for 6 weeks.  3. No driving for 1 week(s).  Do not drive if you are taking narcotic pain medicine.  4. Shower daily.  Use soap and water on your incision and pat dry; don't rub.  No tub baths until cleared by your surgeon.   5. No sexual activity and nothing in the vagina for 8  weeks IF YOU HAVE A HYSTERECTOMY.  6. You may experience a small amount of clear drainage from your incisions, which is normal.  If the drainage persists or increases, please call the office.  7. IF YOU HAVE A HYSTERECTOMY: You may experience vaginal spotting after surgery or around the 6-8 week mark from surgery when the stitches at the top of the vagina begin to dissolve.  The spotting is normal but if you experience heavy bleeding, call our office.  8. Take Tylenol or ibuprofen first for pain and only use narcotic pain medication for severe pain not relieved by the Tylenol or Ibuprofen.  Monitor your Tylenol intake to a max of 4,000 mg.  Diet: 1. Low sodium Heart Healthy Diet is recommended.  2. It is safe to use a laxative, such as Miralax or Colace, if you have difficulty moving your bowels. You can take Sennakot at bedtime every evening to keep bowel movements regular and to prevent constipation.    Wound Care: 1. Keep clean and dry.  Shower daily.  Reasons to call the Doctor:  Fever - Oral temperature greater than 100.4 degrees Fahrenheit  Foul-smelling vaginal discharge  Difficulty urinating  Nausea and vomiting  Increased pain at the site of the incision that is unrelieved with pain medicine.  Difficulty breathing with or without chest pain  New calf pain especially if only on one side  Sudden, continuing increased vaginal bleeding with or without clots.   Contacts: For questions or concerns you should contact:  Dr. Everitt Amber at [phone]  [name], NP at [phone]  After Hours: call [phone] and have the GYN Oncologist paged/contacted  [facility] supplies for the bowel prep at a pharmacy of your choice: Office-e-mailed prescriptions for your antibiotic pills (Neomycin & Erythromycin)  2 bottles of magnesium citrate- no prescription required    Change your diet to make the bowel prep go more  easily: Switch to a bland, low fiber diet Stop eating any nuts, popcorn, or fruit with seeds.  Stop all fiber supplements such as Metamucil, Miralax, etc.    Improve nutrition: Consider drinking 2-3 nutritional shakes (with low sugar/carb content Ex: glucerna) every day, starting 5 days prior to surgery   DAY PRIOR TO SURGERY   Switch to a full liquid diet the day before surgery Drink plenty of liquids all day to avoid getting dehydrated    12:00pm Drink two bottles of magnesium citrate.   (You should finish in 2 hours)  2:00pm Take 2 Neomycin 500mg  tablets & 2 Erythromycin 500mg  tablets  3:00pm Take 2 Neomycin 500mg  tablets & 2 Erythromycin 500mg  tablets  Drink plenty of clear liquids all evening to avoid getting dehydrated  10:00pm Take 2 Neomycin 500mg  tablets & 2 Erythromycin 500mg  tablets  Do not eat anything solid after bedtime (midnight) the night before your surgery.   BUT DO drink plenty of clear liquids (Water, Gatorade, juice, soda, coffee, tea, broths, etc.) up to 3 hours prior to surgery to avoid getting dehydrated.    MORNING OF SURGERY  Remember to not to eat anything solid that morning   Hold or take medications as recommended by the hospital staff at your Preoperative visit Stop drinking liquids before you leave the house (>3 hours prior to surgery)    If you have questions or concerns, please call GYN Oncology Office at [phone] during business hours to speak to the clinical staff for advice.    WHAT DO I NEED TO KNOW ABOUT A FULL LIQUID DIET? -You may have any liquid. -You may have any food that becomes a liquid at room temperature. The food is considered a liquid if it can be poured off a spoon at room temperature. WHAT FOODS CAN I EAT? -Grains: Any grain food that can be pureed in soup (such as crackers, pasta, and rice). Hot cereal (such as farina or oatmeal) that has been blended.  -Fruits: Fruit juice, including nectars and  juices. -Meats and Other Protein Sources: Eggs in custard, eggnog mix, and eggs used in ice cream or pudding. Strained meats, like in baby food, may be allowed. Consult your health care provider.  -Dairy: Milk and milk-based beverages, including milk shakes and instant breakfast mixes. Smooth yogurt. Pureed cottage cheese. Avoid these foods if they are not well tolerated. -Beverages: All beverages, including liquid nutritional supplements. AVOID CARBONATED BEVERAGES. -Condiments: Iodized salt, pepper, spices, and flavorings. Cocoa powder. Vinegar, ketchup, yellow mustard, smooth sauces (such as hollandaise, cheese sauce,  or white sauce), and soy sauce. -Sweets and Desserts: Custard, smooth pudding. Flavored gelatin. Tapioca, junket. Plain ice cream, sherbet, fruit ices. Frozen ice pops, frozen fudge pops, pudding pops, and other frozen bars with cream. Syrups, including chocolate syrup. Sugar, honey, jelly.  -Fats and Oils: Margarine, butter, cream, sour cream, and oils. -Other: Broth and cream soups. Strained, broth-based soups. The items listed above may not be a complete list of recommended foods or beverages.  WHAT FOODS CAN I NOT EAT? Grains: All breads. Grains are not allowed unless they are pureed into soup. Vegetables: No raw vegetables. Vegetables are not allowed unless they are juiced, or cooked and pureed into soup. Fruits: Fruits are not allowed unless they are juiced. Meats and Other Protein Sources: Any meat or fish. Cooked or raw eggs. Nut butters.  Dairy: Cheese.  Condiments: Stone ground mustards. Fats and Oils: Fats that are coarse or chunky. Sweets and Desserts: Ice cream or other frozen desserts that have any solids in them or on top, such as nuts, chocolate chips, and pieces of cookies. Cakes. Cookies. Candy. Others: Soups with chunks or pieces in them.

## 2018-11-15 LAB — CA 125: Cancer Antigen (CA) 125: 13.3 U/mL (ref 0.0–38.1)

## 2018-11-17 ENCOUNTER — Telehealth: Payer: Self-pay

## 2018-11-17 NOTE — Telephone Encounter (Signed)
Told [place] that the CA-125 was WNL at 13.3. Pt verbalized understanding.

## 2018-12-01 ENCOUNTER — Telehealth: Payer: Self-pay | Admitting: *Deleted

## 2018-12-01 NOTE — Telephone Encounter (Signed)
Returned the patient's call, patient stated that she figured out who called her. Patient asked about a time for her surgery. Explained that she will be given the time at her per op appt

## 2018-12-19 NOTE — Patient Instructions (Addendum)
DUE TO COVID-19 ONLY ONE VISITOR IS ALLOWED TO COME WITH YOU AND STAY IN THE WAITING ROOM ONLY DURING PRE OP AND PROCEDURE DAY OF SURGERY. THE 1 VISITOR MAY VISIT WITH YOU AFTER SURGERY IN YOUR PRIVATE ROOM DURING VISITING HOURS ONLY!  YOU NEED TO HAVE A COVID 19 TEST ON___11-16____ @_  1025 AM______, THIS TEST MUST BE DONE BEFORE SURGERY, COME  [address], [place] , [address].  (Holly Wells) ONCE YOUR COVID TEST IS COMPLETED, PLEASE BEGIN THE QUARANTINE INSTRUCTIONS AS OUTLINED IN YOUR HANDOUT.                Holly Wells    Your procedure is scheduled on: 11-19   Report to [facility]  Entrance   Report to admitting at 8:00AM     Call this number if you have problems the morning of surgery [phone]      Remember: [place].   COLON BOWEL PREP   FIVE DAYS PRIOR TO YOUR SURGERY  Obtain supplies for the bowel prep at a pharmacy of your choice: Office-e-mailed prescriptions for your antibiotic pills (Neomycin & Erythromycin)  2 bottles of magnesium citrate- no prescription required    Change your diet to make the bowel prep go more easily: Switch to a bland, low fiber diet Stop eating any nuts, popcorn, or fruit with seeds.  Stop all fiber supplements such as Metamucil, Miralax, etc.    Improve nutrition: Consider drinking 2-3 nutritional shakes (with low sugar/carb content Ex: glucerna) every day, starting 5 days prior to surgery   DAY PRIOR TO SURGERY   Switch to a full liquid diet the day before surgery Drink plenty of liquids all day to avoid getting dehydrated    12:00pm Drink two bottles of magnesium citrate.   (You should finish in 2 hours)  2:00pm Take 2 Neomycin 500mg  tablets & 2 Erythromycin 500mg  tablets  3:00pm Take 2 Neomycin 500mg  tablets & 2 Erythromycin 500mg  tablets  Drink plenty of clear liquids all evening to avoid getting  dehydrated  10:00pm Take 2 Neomycin 500mg  tablets & 2 Erythromycin 500mg  tablets  Do not eat anything solid after bedtime (midnight) the night before your surgery.   BUT DO drink plenty of clear liquids (Water, Gatorade, juice, soda, coffee, tea, broths, etc.) up to 3 hours prior to surgery to avoid getting dehydrated.    MORNING OF SURGERY  Remember to not to eat anything solid that morning   Hold or take medications as recommended by the hospital staff at your Preoperative visit   [place] 7:00AM MORNING OF SURGERY . NOTHING BY MOUTH AFTER 7:00AM!      CLEAR LIQUID DIET   Foods Allowed                                                                     Foods Excluded  Coffee and tea, regular and decaf                             liquids that you cannot  Plain Jell-O any favor except red or purple  see through such as: Fruit ices (not with fruit pulp)                                     milk, soups, orange juice  Iced Popsicles                                    All solid food Carbonated beverages, regular and diet                                    Cranberry, grape and apple juices Sports drinks like Gatorade Lightly seasoned clear broth or consume(fat free) Sugar, honey syrup  Sample Menu Breakfast                                Lunch                                     Supper Cranberry juice                    Beef broth                            Chicken broth Jell-O                                     Grape juice                           Apple juice Coffee or tea                        Jell-O                                      Popsicle                                                Coffee or tea                        Coffee or tea  _____________________________________________________________________      BRUSH YOUR TEETH MORNING OF SURGERY AND RINSE YOUR MOUTH OUT, NO CHEWING  GUM CANDY OR MINTS.     Take these medicines the morning of surgery with A SIP OF WATER: NONE   How to Manage Your Diabetes Before and After Surgery  Why is it important to control my blood sugar before and after surgery? . Improving blood sugar levels before and after surgery helps healing and can limit problems. . A way of improving blood sugar control is eating a healthy diet by: o  Eating less sugar and carbohydrates o  Increasing activity/exercise o  Talking with your doctor about reaching your blood  sugar goals . High blood sugars (greater than 180 mg/dL) can raise your risk of infections and slow your recovery, so you will need to focus on controlling your diabetes during the weeks before surgery. . Make sure that the doctor who takes care of your diabetes knows about your planned surgery including the date and location.  How do I manage my blood sugar before surgery? . Check your blood sugar at least 4 times a day, starting 2 days before surgery, to make sure that the level is not too high or low. o Check your blood sugar the morning of your surgery when you wake up and every 2 hours until you get to the Short Stay unit. . If your blood sugar is less than 70 mg/dL, you will need to treat for low blood sugar: o Do not take insulin. o Treat a low blood sugar (less than 70 mg/dL) with  cup of clear juice (cranberry or apple), 4 glucose tablets, OR glucose gel. o Recheck blood sugar in 15 minutes after treatment (to make sure it is greater than 70 mg/dL). If your blood sugar is not greater than 70 mg/dL on recheck, call [phone] for further instructions. . Report your blood sugar to the short stay nurse when you get to Short Stay.  . If you are admitted to the hospital after surgery: o Your blood sugar will be checked by the staff and you will probably be given insulin after surgery (instead of oral diabetes medicines) to make sure you have good blood sugar levels. o The goal for  blood sugar control after surgery is 80-180 mg/dL.   WHAT DO I DO ABOUT MY DIABETES MEDICATION?   . THE DAY BEFORE SURGERY, o Take Metformin      . THE MORNING OF SURGERY o Do not take Metformin   Reviewed and Endorsed by [facility] Patient Education Committee, August 2015                               You may not have any metal on your body including hair pins and              piercings  Do not wear jewelry, make-up, lotions, powders or perfumes, deodorant             Do not wear nail polish on your fingernails.  Do not shave  48 hours prior to surgery.     Do not bring valuables to the hospital. [place].  Contacts, dentures or bridgework may not be worn into surgery.       Patients discharged the day of surgery will not be allowed to drive home. IF YOU ARE HAVING SURGERY AND GOING HOME THE SAME DAY, YOU MUST HAVE AN ADULT TO DRIVE YOU HOME AND BE WITH YOU FOR 24 HOURS. YOU MAY GO HOME BY TAXI OR UBER OR ORTHERWISE, BUT AN ADULT MUST ACCOMPANY YOU HOME AND STAY WITH YOU FOR 24 HOURS.  Name and phone number of your driver:DAUGHTERS NIA PITT OR [place]  Special Instructions: N/A              Please read over the following fact sheets you were given: _____________________________________________________________________             [facility] - Preparing for Surgery Before surgery, you can  play an important role.  Because skin is not sterile, your skin needs to be as free of germs as possible.  You can reduce the number of germs on your skin by washing with CHG (chlorahexidine gluconate) soap before surgery.  CHG is an antiseptic cleaner which kills germs and bonds with the skin to continue killing germs even after washing. Please DO NOT use if you have an allergy to CHG or antibacterial soaps.  If your skin becomes reddened/irritated stop using the CHG and inform your nurse when you arrive at Short Stay. Do not shave  (including legs and underarms) for at least 48 hours prior to the first CHG shower.  You may shave your face/neck. Please follow these instructions carefully:  1.  Shower with CHG Soap the night before surgery and the  morning of Surgery.  2.  If you choose to wash your hair, wash your hair first as usual with your  normal  shampoo.  3.  After you shampoo, rinse your hair and body thoroughly to remove the  shampoo.                           4.  Use CHG as you would any other liquid soap.  You can apply chg directly  to the skin and wash                       Gently with a scrungie or clean washcloth.  5.  Apply the CHG Soap to your body ONLY FROM THE NECK DOWN.   Do not use on face/ open                           Wound or open sores. Avoid contact with eyes, ears mouth and genitals (private parts).                       Wash face,  Genitals (private parts) with your normal soap.             6.  Wash thoroughly, paying special attention to the area where your surgery  will be performed.  7.  Thoroughly rinse your body with warm water from the neck down.  8.  DO NOT shower/wash with your normal soap after using and rinsing off  the CHG Soap.                9.  Pat yourself dry with a clean towel.            10.  Wear clean pajamas.            11.  Place clean sheets on your bed the night of your first shower and do not  sleep with pets. Day of Surgery : Do not apply any lotions/deodorants the morning of surgery.  Please wear clean clothes to the hospital/surgery center.  FAILURE TO FOLLOW THESE INSTRUCTIONS MAY RESULT IN THE CANCELLATION OF YOUR SURGERY PATIENT SIGNATURE_________________________________  NURSE SIGNATURE__________________________________  ________________________________________________________________________   Adam Phenix  An incentive spirometer is a tool that can help keep your lungs clear and active. This tool measures how well you are filling your lungs with  each breath. Taking long deep breaths may help reverse or decrease the chance of developing breathing (pulmonary) problems (especially infection) following:  A long period of time when you are unable to move or be active.  BEFORE THE PROCEDURE   If the spirometer includes an indicator to show your best effort, your nurse or respiratory therapist will set it to a desired goal.  If possible, sit up straight or lean slightly forward. Try not to slouch.  Hold the incentive spirometer in an upright position. INSTRUCTIONS FOR USE  1. Sit on the edge of your bed if possible, or sit up as far as you can in bed or on a chair. 2. Hold the incentive spirometer in an upright position. 3. Breathe out normally. 4. Place the mouthpiece in your mouth and seal your lips tightly around it. 5. Breathe in slowly and as deeply as possible, raising the piston or the ball toward the top of the column. 6. Hold your breath for 3-5 seconds or for as long as possible. Allow the piston or ball to fall to the bottom of the column. 7. Remove the mouthpiece from your mouth and breathe out normally. 8. Rest for a few seconds and repeat Steps 1 through 7 at least 10 times every 1-2 hours when you are awake. Take your time and take a few normal breaths between deep breaths. 9. The spirometer may include an indicator to show your best effort. Use the indicator as a goal to work toward during each repetition. 10. After each set of 10 deep breaths, practice coughing to be sure your lungs are clear. If you have an incision (the cut made at the time of surgery), support your incision when coughing by placing a pillow or rolled up towels firmly against it. Once you are able to get out of bed, walk around indoors and cough well. You may stop using the incentive spirometer when instructed by your caregiver.  RISKS AND COMPLICATIONS  Take your time so you do not get dizzy or light-headed.  If you are in pain, you may need to take or  ask for pain medication before doing incentive spirometry. It is harder to take a deep breath if you are having pain. AFTER USE  Rest and breathe slowly and easily.  It can be helpful to keep track of a log of your progress. Your caregiver can provide you with a simple table to help with this. If you are using the spirometer at home, follow these instructions: [place] IF:   You are having difficultly using the spirometer.  You have trouble using the spirometer as often as instructed.  Your pain medication is not giving enough relief while using the spirometer.  You develop fever of 100.5 F (38.1 C) or higher. SEEK IMMEDIATE MEDICAL CARE IF:   You cough up bloody sputum that had not been present before.  You develop fever of 102 F (38.9 C) or greater.  You develop worsening pain at or near the incision site. MAKE SURE YOU:   Understand these instructions.  Will watch your condition.  Will get help right away if you are not doing well or get worse. Document Released: 06/04/2006 Document Revised: 04/16/2011 Document Reviewed: 08/05/2006 Exit[employer] Patient Information 2014 [employer], [place].   ________________________________________________________________________  WHAT IS A BLOOD TRANSFUSION? Blood Transfusion Information  A transfusion is the replacement of blood or some of its parts. Blood is made up of multiple cells which provide different functions.  Red blood cells carry oxygen and are used for blood loss replacement.  White blood cells fight against infection.  Platelets control bleeding.  Plasma helps clot blood.  Other blood products are available for specialized needs, such as  hemophilia or other clotting disorders. BEFORE THE TRANSFUSION  Who gives blood for transfusions?   Healthy volunteers who are fully evaluated to make sure their blood is safe. This is blood bank blood. Transfusion therapy is the safest it has ever been in the practice of  medicine. Before blood is taken from a donor, a complete history is taken to make sure that person has no history of diseases nor engages in risky social behavior (examples are intravenous drug use or sexual activity with multiple partners). The donor's travel history is screened to minimize risk of transmitting infections, such as malaria. The donated blood is tested for signs of infectious diseases, such as HIV and hepatitis. The blood is then tested to be sure it is compatible with you in order to minimize the chance of a transfusion reaction. If you or a relative donates blood, this is often done in anticipation of surgery and is not appropriate for emergency situations. It takes many days to process the donated blood. RISKS AND COMPLICATIONS Although transfusion therapy is very safe and saves many lives, the main dangers of transfusion include:   Getting an infectious disease.  Developing a transfusion reaction. This is an allergic reaction to something in the blood you were given. Every precaution is taken to prevent this. The decision to have a blood transfusion has been considered carefully by your caregiver before blood is given. Blood is not given unless the benefits outweigh the risks. AFTER THE TRANSFUSION  Right after receiving a blood transfusion, you will usually feel much better and more energetic. This is especially true if your red blood cells have gotten low (anemic). The transfusion raises the level of the red blood cells which carry oxygen, and this usually causes an energy increase.  The nurse administering the transfusion will monitor you carefully for complications. HOME CARE INSTRUCTIONS  No special instructions are needed after a transfusion. You may find your energy is better. Speak with your caregiver about any limitations on activity for underlying diseases you may have. SEEK MEDICAL CARE IF:   Your condition is not improving after your transfusion.  You develop  redness or irritation at the intravenous (IV) site. SEEK IMMEDIATE MEDICAL CARE IF:  Any of the following symptoms occur over the next 12 hours:  Shaking chills.  You have a temperature by mouth above 102 F (38.9 C), not controlled by medicine.  Chest, back, or muscle pain.  People around you feel you are not acting correctly or are confused.  Shortness of breath or difficulty breathing.  Dizziness and fainting.  You get a rash or develop hives.  You have a decrease in urine output.  Your urine turns a dark color or changes to pink, red, or brown. Any of the following symptoms occur over the next 10 days:  You have a temperature by mouth above 102 F (38.9 C), not controlled by medicine.  Shortness of breath.  Weakness after normal activity.  The white part of the eye turns yellow (jaundice).  You have a decrease in the amount of urine or are urinating less often.  Your urine turns a dark color or changes to pink, red, or brown. Document Released: 01/20/2000 Document Revised: 04/16/2011 Document Reviewed: 09/08/2007 [facility] Patient Information 2014 [place], [place].  _______________________________________________________________________

## 2018-12-22 ENCOUNTER — Encounter (HOSPITAL_COMMUNITY): Payer: Self-pay | Admitting: Physician Assistant

## 2018-12-22 ENCOUNTER — Telehealth: Payer: Self-pay

## 2018-12-22 ENCOUNTER — Encounter (HOSPITAL_COMMUNITY): Payer: Self-pay

## 2018-12-22 ENCOUNTER — Other Ambulatory Visit: Payer: Self-pay

## 2018-12-22 ENCOUNTER — Other Ambulatory Visit (HOSPITAL_COMMUNITY)
Admission: RE | Admit: 2018-12-22 | Discharge: 2018-12-22 | Disposition: A | Discharge status: Home / Self Care | Payer: PRIVATE HEALTH INSURANCE | Source: Ambulatory Visit | Attending: Gynecologic Oncology | Admitting: Gynecologic Oncology

## 2018-12-22 ENCOUNTER — Encounter (HOSPITAL_COMMUNITY)
Admission: RE | Admit: 2018-12-22 | Discharge: 2018-12-22 | Disposition: A | Discharge status: Home / Self Care | Payer: PRIVATE HEALTH INSURANCE | Source: Ambulatory Visit | Attending: Gynecologic Oncology | Admitting: Gynecologic Oncology

## 2018-12-22 DIAGNOSIS — N390 Urinary tract infection, site not specified: Secondary | ICD-10-CM

## 2018-12-22 DIAGNOSIS — Z01812 Encounter for preprocedural laboratory examination: Secondary | ICD-10-CM | POA: Diagnosis present

## 2018-12-22 DIAGNOSIS — Z20828 Contact with and (suspected) exposure to other viral communicable diseases: Secondary | ICD-10-CM | POA: Insufficient documentation

## 2018-12-22 HISTORY — DX: Family history of other specified conditions: Z84.89

## 2018-12-22 HISTORY — DX: Unspecified asthma, uncomplicated: J45.909

## 2018-12-22 LAB — COMPREHENSIVE METABOLIC PANEL
ALT: 14 U/L (ref 0–44)
AST: 16 U/L (ref 15–41)
Albumin: 3.9 g/dL (ref 3.5–5.0)
Alkaline Phosphatase: 77 U/L (ref 38–126)
Anion gap: 7 (ref 5–15)
BUN: 17 mg/dL (ref 6–20)
CO2: 26 mmol/L (ref 22–32)
Calcium: 9.5 mg/dL (ref 8.9–10.3)
Chloride: 104 mmol/L (ref 98–111)
Creatinine, Ser: 0.87 mg/dL (ref 0.44–1.00)
GFR calc Af Amer: >60 mL/min (ref >60)
GFR calc non Af Amer: >60 mL/min (ref >60)
Glucose, Bld: 159 mg/dL — ABNORMAL HIGH (ref 70–99)
Potassium: 4.1 mmol/L (ref 3.5–5.1)
Sodium: 137 mmol/L (ref 135–145)
Total Bilirubin: 0.1 mg/dL — ABNORMAL LOW (ref 0.3–1.2)
Total Protein: 7.8 g/dL (ref 6.5–8.1)

## 2018-12-22 LAB — URINALYSIS, ROUTINE W REFLEX MICROSCOPIC
Bilirubin Urine: NEGATIVE
Glucose, UA: NEGATIVE mg/dL
Hgb urine dipstick: NEGATIVE
Ketones, ur: NEGATIVE mg/dL
Nitrite: POSITIVE — AB
Protein, ur: NEGATIVE mg/dL
Specific Gravity, Urine: 1.02 (ref 1.005–1.030)
pH: 5 (ref 5.0–8.0)

## 2018-12-22 LAB — CBC
HCT: 40.3 % (ref 36.0–46.0)
Hemoglobin: 12.6 g/dL (ref 12.0–15.0)
MCH: 28.4 pg (ref 26.0–34.0)
MCHC: 31.3 g/dL (ref 30.0–36.0)
MCV: 91 fL (ref 80.0–100.0)
Platelets: 386 10*3/uL (ref 150–400)
RBC: 4.43 MIL/uL (ref 3.87–5.11)
RDW: 13.2 % (ref 11.5–15.5)
WBC: 9.4 10*3/uL (ref 4.0–10.5)
nRBC: 0 % (ref 0.0–0.2)

## 2018-12-22 LAB — GLUCOSE, CAPILLARY: Glucose-Capillary: 159 mg/dL — ABNORMAL HIGH (ref 70–99)

## 2018-12-22 LAB — APTT: aPTT: 29 seconds (ref 24–36)

## 2018-12-22 LAB — PROTIME-INR
INR: 1.1 (ref 0.8–1.2)
Prothrombin Time: 13.7 seconds (ref 11.4–15.2)

## 2018-12-22 MED ORDER — NITROFURANTOIN MONOHYD MACRO 100 MG PO CAPS: 100.0000 mg | ORAL_CAPSULE | Freq: Two times a day (BID) | ORAL | 0 refills | Status: DC

## 2018-12-22 NOTE — Telephone Encounter (Signed)
Told Holly Wells that the urinalysis from today shows that she has a UTI. Pt denies any symptoms of UTI. Told her that Macrobid was sent in to her pharmacy. She needs to pick up prescription today and at least take one dose. The ATB could change pending culture results. Will call her with results or may be reviewed post op pending timing of results. It can take 3 days for culture to result. Pt verbalized understanding.

## 2018-12-22 NOTE — Progress Notes (Addendum)
PCP - DR Hubbard Robinson Cardiologist - NONE  Chest x-ray - 07-28-18 EPIC EKG - 10-02-18 EPIC Stress Test - NONE ECHO - NONE Cardiac Cath - NONE PATIENT FAMILY AND PATIENT HISTORY OF NEEDING ANTICOAGULANT AFTER SURGERY MANY YRS AGO, JESSICA ZANETTO MADW AWARE AND PT, PTT ORDERED FOR PRE OP TODAY, PATIENT STATED SHE MADE DR ROSSI NURSE AWARE OF CLOTTING PROBLEM AFTER SURGERY AND NURSE WOULD MAKE DR Denman George AWARE LAST HEMAGLOBIN  A1C 11-14-18 (8.0) DO NOT NEED TO REPEAT PER JESSICA ZANETTO PA  URINE CULTURE RESULTS PENDING  Sleep Study - NONE CPAP -   Fasting Blood Sugar - 125-130 Checks Blood Sugar 1 TO 1X PER DAY  Blood Thinner Instructions:N/A Aspirin Instructions:N/A Last Dose:  Anesthesia review: CHART TO JESSICA ZANETTO PA FOR REVIEW  Patient denies shortness of breath, fever, cough and chest pain at PAT appointment   Patient verbalized understanding of instructions that were given to them at the PAT appointment. Patient was also instructed that they will need to review over the PAT instructions again at home before surgery.

## 2018-12-23 ENCOUNTER — Telehealth: Payer: Self-pay | Admitting: *Deleted

## 2018-12-23 ENCOUNTER — Telehealth: Payer: Self-pay | Admitting: Gynecologic Oncology

## 2018-12-23 LAB — NOVEL CORONAVIRUS, NAA (HOSP ORDER, SEND-OUT TO REF LAB; TAT 18-24 HRS): SARS-CoV-2, NAA: NOT DETECTED

## 2018-12-23 NOTE — Telephone Encounter (Signed)
I informed patient of her elevated HbA1c of 8% which is outside of the range that we had deemed acceptable for elective surgery.  I recommended postponement of the surgery.  The patient explained that she had stopped taking her Invokana in the summer due to expense.  I explained that it was not safe to proceed as she had an exceptionally high risk for wound infection or wound healing complications or cancellation of the day of surgery due to hyperglycemia.  The patient was tearful and begged for surgery to be performed.  She reported that she could not afford the Invokana.  She is in the process of changing primary care doctors to see if there is a different more affordable medication that she can take.  After much discussion we came to an agreement that I would proceed with surgery as planned on November 19.  I explained that in doing so she would have to accept much higher than average risk for complications given her poorly controlled diabetes.  I explained that it was medically safe to postpone her surgery given the elective nature of her condition.  The patient felt strongly about proceeding with surgery even in spite of the increased risks associated with it.  She will do everything she can to optimize blood glucoses perioperatively including diet control.  If she can afford her Invokana she will repurchase and begin taking again.  Thereasa Solo, MD

## 2018-12-23 NOTE — Telephone Encounter (Signed)
Patient called and wants to cancel her surgery for 11/19 and reschedule for later. Patient stated that she wants to get with her doctor and get her sugars under control. Message forwarded to [facility] APP

## 2018-12-24 ENCOUNTER — Other Ambulatory Visit: Payer: Self-pay | Admitting: Gynecologic Oncology

## 2018-12-24 DIAGNOSIS — E119 Type 2 diabetes mellitus without complications: Secondary | ICD-10-CM

## 2018-12-24 LAB — URINE CULTURE: Culture: >=100000 — AB

## 2018-12-24 NOTE — Telephone Encounter (Signed)
I spoke with Holly Wells this am.  She has started the macrobid.  I confirmed that her surgery scheduled for 12/25/2018 has been cancelled secondary to elevated A1c.  I have scheduled her for f/u with Dr. Denman George on 03/27/19 with A1C blood test on 03/23/19.  Holly Wells verbalized understanding.

## 2018-12-25 ENCOUNTER — Ambulatory Visit (HOSPITAL_COMMUNITY)
Admission: RE | Admit: 2018-12-25 | Payer: PRIVATE HEALTH INSURANCE | Source: Home / Self Care | Admitting: Gynecologic Oncology

## 2018-12-25 LAB — TYPE AND SCREEN
ABO/RH(D): A POS
Antibody Screen: NEGATIVE

## 2018-12-25 SURGERY — SALPINGO-OOPHORECTOMY, ROBOT-ASSISTED
Anesthesia: General | Laterality: Right

## 2019-01-05 ENCOUNTER — Other Ambulatory Visit: Payer: Self-pay

## 2019-01-05 DIAGNOSIS — Z20822 Contact with and (suspected) exposure to covid-19: Secondary | ICD-10-CM

## 2019-01-06 ENCOUNTER — Emergency Department (HOSPITAL_COMMUNITY)
Admission: EM | Admit: 2019-01-06 | Discharge: 2019-01-06 | Disposition: A | Discharge status: Home / Self Care | Payer: No Typology Code available for payment source | Attending: Emergency Medicine | Admitting: Emergency Medicine

## 2019-01-06 ENCOUNTER — Emergency Department (HOSPITAL_COMMUNITY): Payer: No Typology Code available for payment source

## 2019-01-06 ENCOUNTER — Other Ambulatory Visit: Payer: Self-pay

## 2019-01-06 DIAGNOSIS — U071 COVID-19: Secondary | ICD-10-CM | POA: Diagnosis not present

## 2019-01-06 DIAGNOSIS — Z87891 Personal history of nicotine dependence: Secondary | ICD-10-CM | POA: Diagnosis not present

## 2019-01-06 DIAGNOSIS — Z7984 Long term (current) use of oral hypoglycemic drugs: Secondary | ICD-10-CM | POA: Diagnosis not present

## 2019-01-06 DIAGNOSIS — Z79899 Other long term (current) drug therapy: Secondary | ICD-10-CM | POA: Insufficient documentation

## 2019-01-06 DIAGNOSIS — J45909 Unspecified asthma, uncomplicated: Secondary | ICD-10-CM | POA: Diagnosis not present

## 2019-01-06 DIAGNOSIS — E119 Type 2 diabetes mellitus without complications: Secondary | ICD-10-CM | POA: Diagnosis not present

## 2019-01-06 DIAGNOSIS — R531 Weakness: Secondary | ICD-10-CM | POA: Diagnosis present

## 2019-01-06 DIAGNOSIS — I10 Essential (primary) hypertension: Secondary | ICD-10-CM | POA: Diagnosis not present

## 2019-01-06 LAB — NOVEL CORONAVIRUS, NAA: SARS-CoV-2, NAA: DETECTED — AB

## 2019-01-06 NOTE — ED Provider Notes (Signed)
[facility] DEPT Provider Note   CSN: [id number] Arrival date & time: 01/06/19  1449     History   Chief Complaint Chief Complaint  Patient presents with  . COVID positive  . Shortness of Breath    HPI Holly Wells is a 48 y.o. female with PMHx anxiety, asthma, diabetes, HTN who presents to the ED today with complaints of generalized weakness/fatigue, dry cough, and shortness of breath x 4 days.  Patient reports that she is recently taking care of her mother who found out that she was Covid positive and is currently in the hospital [facility].  She states that she went and got tested yesterday after having symptoms 4 days ago and was told earlier today that she was positive.  Patient states that she wanted to come to the ED to get checked out given she feels like she is short of breath and is concerned.  States that she has been taking OTC sinus medications for her symptoms with mild relief.  She denies fever, chills, chest pain, abdominal pain, nausea, vomiting, diarrhea, leg swelling, any other associated symptoms.       Past Medical History:  Diagnosis Date  . Anxiety   . Asthma    ALLERGY INDUCED ASTHMA USES INHALER AT [facility]  . Complication of anesthesia    HAD CLOTTING AFTER APPENDIX AND BOWEL AND PARTIAL STOMACH SX YRS AGO AT [facility], NO SURGERY SINCE  . Diabetes mellitus    TYPE2  . 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 Family history of adverse reaction to anesthesia    FAMILY HX OF CLOTTING PROBLEMS AFTER SURGERY AND NEEDING BLOOD THINNERS  . Fibroid tumor   . History of kidney stones   . Hypertension   . Ovarian cyst   . Seasonal allergies     Patient Active Problem List   Diagnosis Date Noted  . Right ovarian cyst 11/14/2018  . 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 Type 2 diabetes mellitus without complication, with long-term current use of insulin ([place]) 11/14/2018  . Obesity (BMI 30.0-34.9)  11/14/2018  . Left ovarian cyst 12/19/2014  . LLQ pain 12/19/2014  . Labial abscess 12/19/2014  . Fallopian tube disorder   . MENORRHAGIA 12/22/2007  . HIDRADENITIS SUPPURATIVA 06/06/2007  . HYPERLIPIDEMIA 05/22/2007  . DIABETES MELLITUS II, UNCOMPLICATED [id number]  . OBESITY, NOS 04/04/2006  . DEPRESSION, MAJOR, RECURRENT 04/04/2006  . PANIC ATTACKS 04/04/2006  . HYPERTENSION, BENIGN SYSTEMIC 04/04/2006  . GASTROESOPHAGEAL REFLUX, NO ESOPHAGITIS 04/04/2006    Past Surgical History:  Procedure Laterality Date  . APPENDECTOMY    . CHOLECYSTECTOMY     1991  . EXPLORATORY LAPAROTOMY W/ BOWEL RESECTION    . Stomach and bowel surg    . TONSILLECTOMY       OB History    Gravida  2   Para  2   Term  2   Preterm      AB      Living  2     SAB      TAB      Ectopic      Multiple      Live Births               Home Medications    Prior to Admission medications   Medication Sig Start Date End Date Taking? Authorizing Provider  cetirizine-pseudoephedrine (ZYRTEC-D) 5-120 MG tablet Take 1 tablet by mouth daily. Patient  taking differently: Take 1 tablet by mouth daily as needed for allergies.  07/28/18   Palumbo, April, MD  erythromycin base (E-MYCIN) 500 MG tablet Take two tablets the day before surgery at 2pm, 3pm, 10pm 11/14/18   Cross, Melissa D, NP  FLOVENT HFA 110 MCG/ACT inhaler Inhale 2 puffs into the lungs at bedtime as needed (shortness of breath).  08/30/18   [provider]  hydrOXYzine (ATARAX/VISTARIL) 25 MG tablet Take 1 tablet (25 mg total) by mouth every 6 (six) hours. Patient not taking: Reported on 12/16/2018 10/02/18   Jacqlyn Larsen, PA-C  ibuprofen (ADVIL) 200 MG tablet Take 200 mg by mouth every 6 (six) hours as needed for headache or moderate pain.    [provider]  INVOKANA 300 MG TABS tablet Take 300 mg by mouth daily. 07/21/18   [provider]  Ketotifen Fumarate (ALLERGY EYE DROPS OP) Place 1 drop into both  eyes daily as needed (allergies).    [provider]  lisinopril-hydrochlorothiazide (PRINZIDE,ZESTORETIC) 20-25 MG per tablet Take 1 tablet by mouth daily.      [provider]  metFORMIN (GLUCOPHAGE) 1000 MG tablet Take 1,000 mg by mouth daily.     [provider]  Multiple Vitamin (MULTIVITAMIN WITH MINERALS) TABS tablet Take 1 tablet by mouth daily.    [provider]  neomycin (MYCIFRADIN) 500 MG tablet Take two tablets the day before surgery at 2pm, 3pm, 10pm 11/14/18   Cross, Melissa D, NP  nitrofurantoin, macrocrystal-monohydrate, (MACROBID) 100 MG capsule Take 1 capsule (100 mg total) by mouth 2 (two) times daily. 12/22/18   Cross, Lenna Sciara D, NP  traMADol (ULTRAM) 50 MG tablet 1-2 tabs every 6 hours prn moderate pain Patient taking differently: Take 50-100 mg by mouth every 6 (six) hours as needed for moderate pain or severe pain.  06/24/18   Princess Bruins, MD    Family History Family History  Problem Relation Age of Onset  . Hypertension Mother   . Thyroid cancer Mother   . Diabetes Maternal Grandmother   . Hypertension Maternal Grandmother   . Breast cancer Maternal Grandmother 78  . Diabetes Maternal Grandfather   . Hypertension Maternal Grandfather   . Deep vein thrombosis Daughter     Social History Social History   Tobacco Use  . Smoking status: Former Research scientist (life sciences)  . Smokeless tobacco: Never Used  . Tobacco comment: 2005, SOCIAL ONLY  Substance Use Topics  . Alcohol use: Yes    Comment: social  . Drug use: No     Allergies   Bactrim [sulfamethoxazole-trimethoprim]   Review of Systems Review of Systems  Constitutional: Negative for chills and fever.  HENT: Positive for congestion.   Respiratory: Positive for cough and shortness of breath.   Cardiovascular: Negative for chest pain.     Physical Exam Updated Vital Signs BP 121/85 (BP Location: Left Arm)   Pulse 73   Temp 97.6 F (36.4 C) (Oral)   Resp 17   LMP  12/15/2018   SpO2 99%   Physical Exam Vitals signs and nursing note reviewed.  Constitutional:      Appearance: She is not ill-appearing or diaphoretic.  HENT:     Head: Normocephalic and atraumatic.  Eyes:     Conjunctiva/sclera: Conjunctivae normal.  Cardiovascular:     Rate and Rhythm: Normal rate and regular rhythm.  Pulmonary:     Effort: Pulmonary effort is normal.     Breath sounds: Normal breath sounds. No decreased breath sounds,  wheezing, rhonchi or rales.     Comments: Able to speak in full sentences without difficulty. No accessory muscle usage. Satting 100% on RA at rest and 99% with ambulation.  Abdominal:     Palpations: Abdomen is soft.     Tenderness: There is no abdominal tenderness. There is no guarding or rebound.  Musculoskeletal:     Right lower leg: No edema.     Left lower leg: No edema.  Skin:    General: Skin is warm and dry.     Coloration: Skin is not jaundiced.  Neurological:     Mental Status: She is alert.      ED Treatments / Results  Labs (all labs ordered are listed, but only abnormal results are displayed) Labs Reviewed - No data to display  EKG None  Radiology Dg Chest [facility] 1 View  Result Date: 01/06/2019 CLINICAL DATA:  49 year old female with cough and shortness of breath. Positive COVID-19. EXAM: PORTABLE CHEST 1 VIEW COMPARISON:  Chest radiograph dated 07/28/2018 and CT dated 12/18/2014. FINDINGS: The lungs are clear. There is no pleural effusion or pneumothorax. The cardiac silhouette is within normal limits. No acute osseous pathology. IMPRESSION: No active disease. Electronically Signed   By: Anner Crete M.D.   On: 01/06/2019 16:49    Procedures Procedures (including critical care time)  Medications Ordered in ED Medications - No data to display   Initial Impression / Assessment and Plan / ED Course  I have reviewed the triage vital signs and the nursing notes.  Pertinent labs & imaging results that were available  during my care of the patient were reviewed by me and considered in my medical decision making (see chart for details).    48 year old female who presents the ED today after finding out that she was Covid positive.  States that she has generalized weakness fatigue, cough, shortness of breath.  Patient is nontoxic-appearing on exam.  She is afebrile without tachycardia or tachypnea.  She is able to speak in full sentences without accessory muscle use.  She is satting 99% on room air with ambulation 100% at rest.  Endorse history of anxiety and states she just wanted to make sure that she was okay given her new Covid 19+ status.  Will obtain x-ray at this time given cough and complaints of shortness of breath.  If negative will discharge home.  Do not feel patient needs baseline blood work at this time.  She has inhalers at home from history of asthma.  Have advised to use as needed.  Patient advised to follow-up with her PCP as well.  Advised to continue self-isolation for 14 days starting from yesterday which is when she was tested and has been self quarantine besides ED visit today.  Patient is in agreement with plan at this time.   X-ray without any acute abnormalities.  Will discharge patient home at this time.  Advised to continue using inhalers as needed.  Advised to take Tylenol as needed for fevers or body aches.  Advised to monitor symptoms and return to the ED if any worsening symptoms including worsening shortness of breath, chest pain.  Patient is in agreement with plan at this time is stable for discharge home.   This note was prepared using Dragon voice recognition software and may include unintentional dictation errors due to the inherent limitations of voice recognition software.  Holly Wells was evaluated in Emergency Department on 01/06/2019 for the symptoms described in the history of present illness.  She was evaluated in the context of the global COVID-19 pandemic, which necessitated  consideration that the patient might be at risk for infection with the SARS-CoV-2 virus that causes COVID-19. Institutional protocols and algorithms that pertain to the evaluation of patients at risk for COVID-19 are in a state of rapid change based on information released by regulatory bodies including the CDC and federal and state organizations. These policies and algorithms were followed during the patient's care in the ED.       Final Clinical Impressions(s) / ED Diagnoses   Final diagnoses:  [id number]    ED Discharge Orders    None       Eustaquio Maize, PA-C 01/06/19 1727    Dorie Rank, MD 01/10/19 [phone]

## 2019-01-06 NOTE — ED Triage Notes (Signed)
Arrived POV; received positive COVID test today. C/O feeling drained and SHOB. Ambulated from ambulance bay w/o assistance. Breathing within normal limits, no increased work of breathing

## 2019-01-06 NOTE — Discharge Instructions (Signed)
Your xray was reassuring today Please use OTC medications as needed to control fevers, body aches, headaches.  Use your inhalers as prescribed.  Follow up with your PCP regarding your COVID 19 positive status.  Continue self isolating at home (cleared: 01/20/2019).  Return to the ED for any worsening symptoms including worsening shortness of breath or chest pains.

## 2019-01-07 ENCOUNTER — Other Ambulatory Visit: Payer: Self-pay | Admitting: Nurse Practitioner

## 2019-01-07 DIAGNOSIS — E119 Type 2 diabetes mellitus without complications: Secondary | ICD-10-CM

## 2019-01-07 DIAGNOSIS — U071 COVID-19: Secondary | ICD-10-CM

## 2019-01-07 DIAGNOSIS — Z794 Long term (current) use of insulin: Secondary | ICD-10-CM

## 2019-01-07 NOTE — Progress Notes (Signed)
  I connected by phone with Holly Wells on 01/07/2019 at 11:43 AM to discuss the potential use of an new treatment for mild to moderate COVID-19 viral infection in non-hospitalized patients.  This patient is a 48 y.o. female that meets the FDA criteria for Emergency Use Authorization of bamlanivimab:  Has a (+) direct SARS-CoV-2 viral test result  Has mild or moderate COVID-19   Is ? 48 years of age and weighs ? 40 kg  Is NOT hospitalized due to COVID-19  Is NOT requiring oxygen therapy or requiring an increase in baseline oxygen flow rate due to COVID-19  Is within 10 days of symptom onset  Has at least one of the high risk factor(s) for progression to severe COVID-19 and/or hospitalization as defined in EUA.  Specific high risk criteria : Diabetes  Patient is currently being actively managed Covid and for the following problems:  Patient Active Problem List   Diagnosis Date Noted  . Right ovarian cyst 11/14/2018  . 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 Type 2 diabetes mellitus without complication, with long-term current use of insulin ([place]) 11/14/2018  . Obesity (BMI 30.0-34.9) 11/14/2018  . Left ovarian cyst 12/19/2014  . LLQ pain 12/19/2014  . Labial abscess 12/19/2014  . Fallopian tube disorder   . MENORRHAGIA 12/22/2007  . HIDRADENITIS SUPPURATIVA 06/06/2007  . HYPERLIPIDEMIA 05/22/2007  . DIABETES MELLITUS II, UNCOMPLICATED Q000111Q  . OBESITY, NOS 04/04/2006  . DEPRESSION, MAJOR, RECURRENT 04/04/2006  . PANIC ATTACKS 04/04/2006  . HYPERTENSION, BENIGN SYSTEMIC 04/04/2006  . GASTROESOPHAGEAL REFLUX, NO ESOPHAGITIS 04/04/2006    I have spoken and communicated the following to the patient or parent/caregiver:  1. FDA has authorized the emergency use of bamlanivimab for the treatment of mild to moderate COVID-19 in adults and pediatric patients with positive results of direct SARS-CoV-2 viral testing who are 17 years of age and older weighing at least 40 kg, and  who are at high risk for progressing to severe COVID-19 and/or hospitalization.  2. The significant known and potential risks and benefits of bamlanivimab, and the extent to which such potential risks and benefits are unknown.  3. Information on available alternative treatments and the risks and benefits of those alternatives, including clinical trials.  4. Patients treated with bamlanivimab should continue to self-isolate and use infection control measures (e.g., wear mask, isolate, social distance, avoid sharing personal items, clean and disinfect "high touch" surfaces, and frequent handwashing) according to CDC guidelines.   5. The patient or parent/caregiver has the option to accept or refuse bamlanivimab.  After reviewing this information with the patient, The patient agreed to proceed with receiving the infusion of bamlanivimab and will be provided a copy of the Fact sheet prior to receiving the infusion.  Fenton Foy 01/07/2019 11:43 AM

## 2019-01-09 ENCOUNTER — Encounter (HOSPITAL_COMMUNITY): Payer: Self-pay

## 2019-01-09 ENCOUNTER — Ambulatory Visit (HOSPITAL_COMMUNITY)
Admission: RE | Admit: 2019-01-09 | Discharge: 2019-01-09 | Disposition: A | Discharge status: Home / Self Care | Payer: No Typology Code available for payment source | Source: Ambulatory Visit | Attending: Critical Care Medicine | Admitting: Critical Care Medicine

## 2019-01-09 DIAGNOSIS — U071 COVID-19: Secondary | ICD-10-CM | POA: Diagnosis present

## 2019-01-09 DIAGNOSIS — Z794 Long term (current) use of insulin: Secondary | ICD-10-CM | POA: Diagnosis present

## 2019-01-09 DIAGNOSIS — E119 Type 2 diabetes mellitus without complications: Secondary | ICD-10-CM | POA: Diagnosis present

## 2019-01-09 MED ORDER — METHYLPREDNISOLONE SODIUM SUCC 125 MG IJ SOLR
INTRAMUSCULAR | Status: AC
  Administered 2019-01-09: 125 mg via INTRAVENOUS
  Filled 2019-01-09: qty 2

## 2019-01-09 MED ORDER — ALBUTEROL SULFATE HFA 108 (90 BASE) MCG/ACT IN AERS
2.0000 | INHALATION_SPRAY | Freq: Once | RESPIRATORY_TRACT | Status: AC | PRN
  Administered 2019-01-09: 15:00:00 2 via RESPIRATORY_TRACT

## 2019-01-09 MED ORDER — EPINEPHRINE 0.3 MG/0.3ML IJ SOAJ: 0.3000 mg | Freq: Once | INTRAMUSCULAR | Status: DC | PRN

## 2019-01-09 MED ORDER — SODIUM CHLORIDE 0.9 % IV SOLN
700.0000 mg | Freq: Once | INTRAVENOUS | Status: AC
  Administered 2019-01-09: 700 mg via INTRAVENOUS
  Filled 2019-01-09: qty 20

## 2019-01-09 MED ORDER — DIPHENHYDRAMINE HCL 50 MG/ML IJ SOLN: 50.0000 mg | Freq: Once | INTRAMUSCULAR | Status: DC | PRN

## 2019-01-09 MED ORDER — ALBUTEROL SULFATE HFA 108 (90 BASE) MCG/ACT IN AERS
INHALATION_SPRAY | RESPIRATORY_TRACT | Status: AC
  Administered 2019-01-09: 2 via RESPIRATORY_TRACT
  Filled 2019-01-09: qty 6.7

## 2019-01-09 MED ORDER — DIPHENHYDRAMINE HCL 50 MG/ML IJ SOLN
INTRAMUSCULAR | Status: AC
  Administered 2019-01-09: 15:00:00
  Filled 2019-01-09: qty 1

## 2019-01-09 MED ORDER — FAMOTIDINE IN NACL 20-0.9 MG/50ML-% IV SOLN
INTRAVENOUS | Status: AC
  Administered 2019-01-09: 20 mg via INTRAVENOUS
  Filled 2019-01-09: qty 50

## 2019-01-09 MED ORDER — METHYLPREDNISOLONE SODIUM SUCC 125 MG IJ SOLR
125.0000 mg | Freq: Once | INTRAMUSCULAR | Status: AC | PRN
  Administered 2019-01-09: 15:00:00 125 mg via INTRAVENOUS

## 2019-01-09 MED ORDER — FAMOTIDINE IN NACL 20-0.9 MG/50ML-% IV SOLN
20.0000 mg | Freq: Once | INTRAVENOUS | Status: AC | PRN
  Administered 2019-01-09: 15:00:00 20 mg via INTRAVENOUS

## 2019-01-09 MED ORDER — SODIUM CHLORIDE 0.9 % IV SOLN
INTRAVENOUS | Status: DC | PRN
  Administered 2019-01-09: 250 mL via INTRAVENOUS

## 2019-01-09 NOTE — Discharge Instructions (Signed)

## 2019-01-09 NOTE — Progress Notes (Signed)
After initiation of medication protocol patient reported difficulty swallowing has improved. Will continue to monitor.

## 2019-01-09 NOTE — Progress Notes (Addendum)
Patient complaining of difficulty swallowing. Medication infusion stopped. Medication reaction protocol initiated. V/S to be obtained.

## 2019-01-09 NOTE — Progress Notes (Signed)
Patient ID: Leonela Nakayama, female   DOB: May 20, 1970, 48 y.o.   MRN: [id number]  This patient was undergoing an infusion with the monoclonal antibody for her Covid infection when she developed increased throat swelling shortness of breath, and difficulty swallowing.  There is no prior known allergies to human proteins.  She does have an itching allergy to Bactrim and does have atopic asthma likely putting her at slightly increased risk for this reaction.  Afrin cessation of infusion and administration of steroids H2 blocker and standard support she is markedly better at this time  Plan will be to continue monitoring for an additional hour and then let her go home.  If she has any sense of swallowing difficulty or itching sensation or any type of allergic symptoms I will call a prescription for steroid Dosepak and an H2 blocker and Benadryl into her local pharmacy  The nurses will let me know if anything further is needed  Saralyn Pilar WrightMD

## 2019-01-09 NOTE — Progress Notes (Signed)
Patient VSS.

## 2019-01-09 NOTE — Progress Notes (Signed)
Patient resting comfortably at this time.Will continue to monitor.

## 2019-01-09 NOTE — Progress Notes (Signed)
V/S/S. Patient reports no difficulty swallowing at this time. Dr. Joya Gaskins has been notified. Will continue to monitor

## 2019-01-09 NOTE — Progress Notes (Signed)
  Diagnosis: COVID-19  Physician:Patrick Joya Gaskins, MD  Procedure: Covid Infusion Clinic Med: bamlanivimab infusion - Provided patient with bamlanimivab fact sheet for patients, parents and caregivers prior to infusion.  Complications: Patient reported difficulty swallowing during the infusion. Infusion was discontinued and reaction protocol initiated. Patient received IV diphenhydramine, IV famotidine, IV methylprednisolone, and albuterol via inhaler. Patient monitored and reported resolution of signs and symptoms. Patient given anaphylactic education with teachback. Patient advised to go to the nearest ED if symptoms resume. Patient given instructions, with teachback, for diphenhydramine dosing at home and albuterol inhaler usage. Albuterol inhaler was sent home with patient.  Discharge: Discharged home   Jenene Slicker 01/09/2019

## 2019-01-09 NOTE — Progress Notes (Signed)
Jena Gauss, DNP will notify Dr. Joya Gaskins. Will continue to monitor.

## 2019-01-15 ENCOUNTER — Ambulatory Visit: Payer: 59 | Admitting: Gynecologic Oncology

## 2019-03-17 ENCOUNTER — Telehealth: Payer: Self-pay | Admitting: Gynecologic Oncology

## 2019-03-17 ENCOUNTER — Telehealth: Payer: Self-pay | Admitting: *Deleted

## 2019-03-17 NOTE — Telephone Encounter (Signed)
Patient called and stated "I wanted to let Dr Denman George know my doctor changed my medicines for the diabetes. I have three new ones; started them on 2/5. I though checking my A1C on 2/15 would be to early. So I moved it out one month to 3/19. Will that be good and enough time for her." Explained that I would give Melissa APP the message and call her back

## 2019-03-17 NOTE — Telephone Encounter (Signed)
Returned patient's phone call regarding rescheduling 02/15 appointment, per patient's request appointment has moved to 03/19.

## 2019-03-19 ENCOUNTER — Telehealth: Payer: Self-pay | Admitting: *Deleted

## 2019-03-19 NOTE — Telephone Encounter (Signed)
Pt states that she is available 04/29/19 at 4:00pm for office visit with Dr. Denman George. Pt requests that surgery be scheduled after April 20th because she will be out of town.

## 2019-03-20 ENCOUNTER — Other Ambulatory Visit: Payer: Self-pay | Admitting: Gynecologic Oncology

## 2019-03-20 ENCOUNTER — Telehealth: Payer: Self-pay

## 2019-03-20 DIAGNOSIS — N83201 Unspecified ovarian cyst, right side: Secondary | ICD-10-CM

## 2019-03-20 NOTE — Telephone Encounter (Signed)
Ms Holly Wells stated that she was returning from her trip on 05-26-19. She needs to quarantine for 2 weeks when she returns. [name] said that Surgery on May 4th would be fine. She is to keep lab and follow up appointment in March as scheduled. Pt verbalized understanding.

## 2019-03-23 ENCOUNTER — Ambulatory Visit: Payer: PRIVATE HEALTH INSURANCE

## 2019-03-27 ENCOUNTER — Ambulatory Visit: Payer: PRIVATE HEALTH INSURANCE | Admitting: Gynecologic Oncology

## 2019-04-24 ENCOUNTER — Inpatient Hospital Stay: Payer: No Typology Code available for payment source | Attending: Gynecologic Oncology

## 2019-04-24 ENCOUNTER — Other Ambulatory Visit: Payer: Self-pay

## 2019-04-24 DIAGNOSIS — Z8249 Family history of ischemic heart disease and other diseases of the circulatory system: Secondary | ICD-10-CM | POA: Diagnosis not present

## 2019-04-24 DIAGNOSIS — F419 Anxiety disorder, unspecified: Secondary | ICD-10-CM | POA: Insufficient documentation

## 2019-04-24 DIAGNOSIS — Z87891 Personal history of nicotine dependence: Secondary | ICD-10-CM | POA: Diagnosis not present

## 2019-04-24 DIAGNOSIS — Z7984 Long term (current) use of oral hypoglycemic drugs: Secondary | ICD-10-CM | POA: Insufficient documentation

## 2019-04-24 DIAGNOSIS — Z6834 Body mass index (BMI) 34.0-34.9, adult: Secondary | ICD-10-CM | POA: Insufficient documentation

## 2019-04-24 DIAGNOSIS — Z794 Long term (current) use of insulin: Secondary | ICD-10-CM

## 2019-04-24 DIAGNOSIS — Z833 Family history of diabetes mellitus: Secondary | ICD-10-CM | POA: Insufficient documentation

## 2019-04-24 DIAGNOSIS — Z808 Family history of malignant neoplasm of other organs or systems: Secondary | ICD-10-CM | POA: Insufficient documentation

## 2019-04-24 DIAGNOSIS — Z7951 Long term (current) use of inhaled steroids: Secondary | ICD-10-CM | POA: Diagnosis not present

## 2019-04-24 DIAGNOSIS — J45909 Unspecified asthma, uncomplicated: Secondary | ICD-10-CM | POA: Insufficient documentation

## 2019-04-24 DIAGNOSIS — I10 Essential (primary) hypertension: Secondary | ICD-10-CM | POA: Insufficient documentation

## 2019-04-24 DIAGNOSIS — Z803 Family history of malignant neoplasm of breast: Secondary | ICD-10-CM | POA: Insufficient documentation

## 2019-04-24 DIAGNOSIS — E669 Obesity, unspecified: Secondary | ICD-10-CM | POA: Diagnosis not present

## 2019-04-24 DIAGNOSIS — N83201 Unspecified ovarian cyst, right side: Secondary | ICD-10-CM | POA: Diagnosis present

## 2019-04-24 DIAGNOSIS — Z79899 Other long term (current) drug therapy: Secondary | ICD-10-CM | POA: Diagnosis not present

## 2019-04-24 DIAGNOSIS — E119 Type 2 diabetes mellitus without complications: Secondary | ICD-10-CM | POA: Insufficient documentation

## 2019-04-24 DIAGNOSIS — N736 Female pelvic peritoneal adhesions (postinfective): Secondary | ICD-10-CM | POA: Insufficient documentation

## 2019-04-24 DIAGNOSIS — Z791 Long term (current) use of non-steroidal anti-inflammatories (NSAID): Secondary | ICD-10-CM | POA: Insufficient documentation

## 2019-04-24 LAB — HEMOGLOBIN A1C
Hgb A1c MFr Bld: 6.9 % — ABNORMAL HIGH (ref 4.8–5.6)
Mean Plasma Glucose: 151.33 mg/dL

## 2019-04-29 ENCOUNTER — Inpatient Hospital Stay (HOSPITAL_BASED_OUTPATIENT_CLINIC_OR_DEPARTMENT_OTHER): Payer: No Typology Code available for payment source | Admitting: Gynecologic Oncology

## 2019-04-29 ENCOUNTER — Encounter: Payer: Self-pay | Admitting: Gynecologic Oncology

## 2019-04-29 ENCOUNTER — Other Ambulatory Visit: Payer: Self-pay

## 2019-04-29 VITALS — BP 102/67 | HR 90 | Temp 98.3°F | Resp 17 | Ht 65.0 in | Wt 197.6 lb

## 2019-04-29 DIAGNOSIS — E119 Type 2 diabetes mellitus without complications: Secondary | ICD-10-CM | POA: Diagnosis not present

## 2019-04-29 DIAGNOSIS — Z87891 Personal history of nicotine dependence: Secondary | ICD-10-CM

## 2019-04-29 DIAGNOSIS — I10 Essential (primary) hypertension: Secondary | ICD-10-CM

## 2019-04-29 DIAGNOSIS — Z8249 Family history of ischemic heart disease and other diseases of the circulatory system: Secondary | ICD-10-CM

## 2019-04-29 DIAGNOSIS — N736 Female pelvic peritoneal adhesions (postinfective): Secondary | ICD-10-CM

## 2019-04-29 DIAGNOSIS — Z7951 Long term (current) use of inhaled steroids: Secondary | ICD-10-CM

## 2019-04-29 DIAGNOSIS — Z7984 Long term (current) use of oral hypoglycemic drugs: Secondary | ICD-10-CM

## 2019-04-29 DIAGNOSIS — Z6834 Body mass index (BMI) 34.0-34.9, adult: Secondary | ICD-10-CM

## 2019-04-29 DIAGNOSIS — Z79899 Other long term (current) drug therapy: Secondary | ICD-10-CM

## 2019-04-29 DIAGNOSIS — N83201 Unspecified ovarian cyst, right side: Secondary | ICD-10-CM | POA: Diagnosis not present

## 2019-04-29 DIAGNOSIS — Z808 Family history of malignant neoplasm of other organs or systems: Secondary | ICD-10-CM

## 2019-04-29 DIAGNOSIS — Z791 Long term (current) use of non-steroidal anti-inflammatories (NSAID): Secondary | ICD-10-CM

## 2019-04-29 DIAGNOSIS — Z803 Family history of malignant neoplasm of breast: Secondary | ICD-10-CM

## 2019-04-29 DIAGNOSIS — E669 Obesity, unspecified: Secondary | ICD-10-CM | POA: Diagnosis not present

## 2019-04-29 DIAGNOSIS — Z833 Family history of diabetes mellitus: Secondary | ICD-10-CM

## 2019-04-29 DIAGNOSIS — F419 Anxiety disorder, unspecified: Secondary | ICD-10-CM

## 2019-04-29 DIAGNOSIS — J45909 Unspecified asthma, uncomplicated: Secondary | ICD-10-CM

## 2019-04-29 MED ORDER — NEOMYCIN SULFATE 500 MG PO TABS: ORAL_TABLET | ORAL | 0 refills | Status: DC

## 2019-04-29 MED ORDER — IBUPROFEN 800 MG PO TABS: 800.0000 mg | ORAL_TABLET | Freq: Three times a day (TID) | ORAL | 0 refills | Status: AC | PRN

## 2019-04-29 MED ORDER — OXYCODONE HCL 5 MG PO TABS: 5.0000 mg | ORAL_TABLET | ORAL | 0 refills | Status: AC | PRN

## 2019-04-29 MED ORDER — SENNOSIDES-DOCUSATE SODIUM 8.6-50 MG PO TABS: 2.0000 | ORAL_TABLET | Freq: Every day | ORAL | 1 refills | Status: AC

## 2019-04-29 MED ORDER — ERYTHROMYCIN BASE 500 MG PO TABS: ORAL_TABLET | ORAL | 0 refills | Status: DC

## 2019-04-29 NOTE — Patient Instructions (Signed)
Preparing for your Surgery  Plan for surgery on Jun 09, 2019 with Dr. Everitt Amber at [facility] will be scheduled for a robotic assisted right salpingo-oophorectomy, lysis of adhesions, possible exploratory laparotomy, possible total abdominal hysterectomy, possible bilateral salpingo-oophorectomy, possible bowel resection.    Pre-operative Testing -You will receive a phone call from presurgical testing at [facility] prior to your surgery to arrange for a pre-operative appointment over the phone, lab appointment, and COVID test.  The COVID test normally occurs 3 days before your surgery and they ask that you self quarantine yourself after the test until surgery so there is a low chance of exposure.  -Bring your insurance card, copy of an advanced directive if applicable, medication list  -At that visit, you will be asked to sign a consent for a possible blood transfusion in case a transfusion becomes necessary during surgery.  The need for a blood transfusion is rare but having consent is a necessary part of your care.     -You should not be taking blood thinners or aspirin at least ten days prior to surgery unless instructed by your surgeon.  -Do not take supplements such as fish oil (omega 3), red yeast rice, tumeric before your surgery.   Day Before Surgery at Home  -See bowel prep information below. Avoid carbonated beverages and anything that causes gas.  Your role in recovery Your role is to become active as soon as directed by your doctor, while still giving yourself time to heal.  Rest when you feel tired. You will be asked to do the following in order to speed your recovery:  - Cough and breathe deeply. This helps to clear and expand your lungs and can prevent pneumonia after surgery.  - [place]. Do mild physical activity. Walking or moving your legs help your circulation and body functions return to normal. Do not try to get up or walk  alone the first time after surgery.   -If you develop swelling on one leg or the other, pain in the back of your leg, redness/warmth in one of your legs, please call the office or go to the Emergency Room to have a doppler to rule out a blood clot. For shortness of breath, chest pain-seek care in the Emergency Room as soon as possible. - Actively manage your pain. Managing your pain lets you move in comfort. We will ask you to rate your pain on a scale of zero to 10. It is your responsibility to tell your doctor or nurse where and how much you hurt so your pain can be treated.  Special Considerations -If you are diabetic, you may be placed on insulin after surgery to have closer control over your blood sugars to promote healing and recovery.  This does not mean that you will be discharged on insulin.  If applicable, your oral antidiabetics will be resumed when you are tolerating a solid diet.  -Your final pathology results from surgery should be available around one week after surgery and the results will be relayed to you when available.  -Dr. Lahoma Crocker is the surgeon that assists your GYN Oncologist with surgery.  If you end up staying the night, the next day after your surgery you will either see Dr. Denman George, Dr. Berline Lopes, or Dr. Lahoma Crocker.  -FMLA forms can be faxed to [phone] and please allow 5-7 business days for completion.  Pain Management After Surgery -You have been prescribed your pain  medication and bowel regimen medications before surgery so that you can have these available when you are discharged from the hospital. The pain medication is for use ONLY AFTER surgery and a new prescription will not be given.   -Make sure that you have Tylenol and Ibuprofen at home to use on a regular basis after surgery for pain control. We recommend alternating the medications every hour to six hours since they work differently and are processed in the body differently for pain  relief.  -Review the attached handout on narcotic use and their risks and side effects.   Bowel Regimen -You have been prescribed Sennakot-S to take nightly to prevent constipation especially if you are taking the narcotic pain medication intermittently.  It is important to prevent constipation and drink adequate amounts of liquids. You can stop taking this medication when you are not taking pain medication and you are back on your normal bowel routine.   Blood Transfusion Information (For the consent to be signed before surgery)  We will be checking your blood type before surgery so in case of emergencies, we will know what type of blood you would need.                                            WHAT IS A BLOOD TRANSFUSION?  A transfusion is the replacement of blood or some of its parts. Blood is made up of multiple cells which provide different functions.  Red blood cells carry oxygen and are used for blood loss replacement.  White blood cells fight against infection.  Platelets control bleeding.  Plasma helps clot blood.  Other blood products are available for specialized needs, such as hemophilia or other clotting disorders. BEFORE THE TRANSFUSION  Who gives blood for transfusions?   You may be able to donate blood to be used at a later date on yourself (autologous donation).  Relatives can be asked to donate blood. This is generally not any safer than if you have received blood from a stranger. The same precautions are taken to ensure safety when a relative's blood is donated.  Healthy volunteers who are fully evaluated to make sure their blood is safe. This is blood bank blood. Transfusion therapy is the safest it has ever been in the practice of medicine. Before blood is taken from a donor, a complete history is taken to make sure that person has no history of diseases nor engages in risky social behavior (examples are intravenous drug use or sexual activity with multiple  partners). The donor's travel history is screened to minimize risk of transmitting infections, such as malaria. The donated blood is tested for signs of infectious diseases, such as HIV and hepatitis. The blood is then tested to be sure it is compatible with you in order to minimize the chance of a transfusion reaction. If you or a relative donates blood, this is often done in anticipation of surgery and is not appropriate for emergency situations. It takes many days to process the donated blood. RISKS AND COMPLICATIONS Although transfusion therapy is very safe and saves many lives, the main dangers of transfusion include:   Getting an infectious disease.  Developing a transfusion reaction. This is an allergic reaction to something in the blood you were given. Every precaution is taken to prevent this. The decision to have a blood transfusion has been considered carefully by  your caregiver before blood is given. Blood is not given unless the benefits outweigh the risks.  AFTER SURGERY INSTRUCTIONS  Return to work: 4-6 weeks if applicable  Activity: 1. Be up and out of the bed during the day.  Take a nap if needed.  You may walk up steps but be careful and use the hand rail.  Stair climbing will tire you more than you think, you may need to stop part way and rest.   2. No lifting or straining for 6 weeks over 10 pounds. No pushing, pulling, straining for 6 weeks.  3. No driving for 1 week(s).  Do not drive if you are taking narcotic pain medicine.   4. You can shower as soon as the next day after surgery. Shower daily.  Use soap and water on your incision and pat dry; don't rub.  No tub baths or submerging your body in water until cleared by your surgeon. If you have the soap that was given to you by pre-surgical testing that was used before surgery, you do not need to use it afterwards because this can irritate your incisions.   5. If a hysterectomy is not performed, nothing in the  vagina/intercourse for 2-3 weeks. IF YOU HAVE A HYSTERECTOMY: No sexual activity and nothing in the vagina for 8 weeks.  6. You may experience a small amount of clear drainage from your incisions, which is normal.  If the drainage persists, increases, or changes color please call the office.  7. Do not use creams, lotions, or ointments such as neosporin on your incisions after surgery until advised by your surgeon because they can cause removal of the dermabond glue on your incisions.    8. You may experience vaginal spotting after surgery. IF YOU HAVE A HYSTERECTOMY, you may see spotting around the 6-8 week mark from surgery when the stitches at the top of the vagina begin to dissolve.  The spotting is normal but if you experience heavy bleeding, call our office.  9. Take Tylenol or ibuprofen first for pain and only use narcotic pain medication for severe pain not relieved by the Tylenol or Ibuprofen.  Monitor your Tylenol intake to a max of 4,000 mg.  Diet: 1. Low sodium Heart Healthy Diet is recommended.  2. It is safe to use a laxative, such as Miralax or Colace, if you have difficulty moving your bowels. You can take Sennakot at bedtime every evening to keep bowel movements regular and to prevent constipation.    Wound Care: 1. Keep clean and dry.  Shower daily.  Reasons to call the Doctor:  Fever - Oral temperature greater than 100.4 degrees Fahrenheit  Foul-smelling vaginal discharge  Difficulty urinating  Nausea and vomiting  Increased pain at the site of the incision that is unrelieved with pain medicine.  Difficulty breathing with or without chest pain  New calf pain especially if only on one side  Sudden, continuing increased vaginal bleeding with or without clots.   Contacts: For questions or concerns you should contact:  Dr. Everitt Amber at [phone]  [name], NP at [phone]  After Hours: call [phone] and have the GYN Oncologist  paged/contacted  [place] supplies for the bowel prep at a pharmacy of your choice: Office-e-mailed prescriptions for your antibiotic pills (Neomycin & Erythromycin)  2 bottles of magnesium citrate- no prescription required    Change your diet to make the bowel prep go  more easily: Switch to a bland, low fiber diet Stop eating any nuts, popcorn, or fruit with seeds.  Stop all fiber supplements such as Metamucil, Miralax, etc.    Improve nutrition: Consider drinking 2-3 nutritional shakes (with low sugar/carb content Ex: glucerna) every day, starting 5 days prior to surgery   DAY PRIOR TO SURGERY   Switch to a full liquid diet the day before surgery Drink plenty of liquids all day to avoid getting dehydrated    12:00pm Drink two bottles of magnesium citrate.   (You should finish in 2 hours)  2:00pm Take 2 Neomycin 500mg  tablets & 2 Erythromycin 500mg  tablets  3:00pm Take 2 Neomycin 500mg  tablets & 2 Erythromycin 500mg  tablets  Drink plenty of clear liquids all evening to avoid getting dehydrated  10:00pm Take 2 Neomycin 500mg  tablets & 2 Erythromycin 500mg  tablets Drink 2 Gatorade 2 (G2) drinks. These will be given at pre-op appointment.     BUT DO drink plenty of clear liquids but monitor the sugar intake (Water, Gatorade, juice, soda, coffee, tea, broths, etc.) up to 3 hours prior to surgery to avoid getting dehydrated.    MORNING OF SURGERY  Remember to not to eat anything solid that morning  Drink one final G2 gatorade drink upon waking up in the morning (needs to be 3 hours before your surgery). Hold or take medications as recommended by the hospital staff at your Preoperative visit Stop drinking liquids before you leave the house (>3 hours prior to surgery)    If you have questions or concerns, please call GYN Oncology Office at [phone] during business hours to speak to the  clinical staff for advice.    WHAT DO I NEED TO KNOW ABOUT A FULL LIQUID DIET? -You may have any liquid. -You may have any food that becomes a liquid at room temperature. The food is considered a liquid if it can be poured off a spoon at room temperature. WHAT FOODS CAN I EAT? -Grains: Any grain food that can be pureed in soup (such as crackers, pasta, and rice). Hot cereal (such as farina or oatmeal) that has been blended.  -Fruits: Fruit juice, including nectars and juices. -Meats and Other Protein Sources: Eggs in custard, eggnog mix, and eggs used in ice cream or pudding. Strained meats, like in baby food, may be allowed. Consult your health care provider.  -Dairy: Milk and milk-based beverages, including milk shakes and instant breakfast mixes. Smooth yogurt. Pureed cottage cheese. Avoid these foods if they are not well tolerated. -Beverages: All beverages, including liquid nutritional supplements. AVOID CARBONATED BEVERAGES. -Condiments: Iodized salt, pepper, spices, and flavorings. Cocoa powder. Vinegar, ketchup, yellow mustard, smooth sauces (such as hollandaise, cheese sauce, or white sauce), and soy sauce. -Sweets and Desserts: Custard, smooth pudding. Flavored gelatin. Tapioca, junket. Plain ice cream, sherbet, fruit ices. Frozen ice pops, frozen fudge pops, pudding pops, and other frozen bars with cream. Syrups, including chocolate syrup. Sugar, honey, jelly.  -Fats and Oils: Margarine, butter, cream, sour cream, and oils. -Other: Broth and cream soups. Strained, broth-based soups. The items listed above may not be a complete list of recommended foods or beverages.  WHAT FOODS CAN I NOT EAT? Grains: All breads. Grains are not allowed unless they are pureed into soup. Vegetables: No raw vegetables. Vegetables are not allowed unless they are juiced, or cooked and pureed into soup. Fruits: Fruits are not allowed unless they are juiced. Meats and Other Protein Sources: Any meat or  fish. Cooked or  raw eggs. Nut butters.  Dairy: Cheese.  Condiments: Stone ground mustards. Fats and Oils: Fats that are coarse or chunky. Sweets and Desserts: Ice cream or other frozen desserts that have any solids in them or on top, such as nuts, chocolate chips, and pieces of cookies. Cakes. Cookies. Candy. Others: Soups with chunks or pieces in them.

## 2019-04-29 NOTE — Progress Notes (Signed)
Follow-up Note: Gyn-Onc  Consult was requested by Dr. Benjie Karvonen for the evaluation of Holly Wells 49 y.o. female  CC:  Chief Complaint  Patient presents with  . Right ovarian cyst    Follow up    Assessment/Plan:  Holly Wells  is a 49 y.o.  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   The patient cyst is symptomatic.  It is limiting her quality of life.  There are no good nonsurgical alternatives to attempt to ameliorate her symptoms.  She requires chronic opioids for this pain. We have previously had extensive counseling about perioperative risks which are substantially higher in this patient who has a complex prior surgical history and diabetes. We revisited these today.  She understands this including her increased risk for bowel injury and conversion to laparotomy and prolonged admission or postop complications.  The plan surgery would be a right salpingo-oophorectomy.  I explained that sometimes adhesive disease means that hysterectomy and BSO needs to be performed at the time of a unilateral oophorectomy because of fused pelvic organs.  She is in agreement that if this is surgically necessary for technical reasons that we will proceed with total hysterectomy with BSO.  If it is safe to approach the left ovarian cyst we will remove this but plan to keep the left ovary as she is premenopausal and age.  If it is not safe to access the left ovarian cyst to adhesive disease we will not treat this cyst at the time of surgery.  I explained that pelvic adhesive disease frequently means that intestinal organs are fused with the gynecologic structures.  This may require bowel resection in order to achieve removal of the ovary.  I explained that bowel resection is associated with risks of stricture, leak, perforation, reoperation, and stoma formation either temporary or permanent.  We will provide her with  a bowel prep preoperatively to decrease the risk of postoperative infection and to maximize the chance that a left-sided colon resection could be reanastomosed primarily.  We will provide her with Entereg preoperatively to decrease the risk of postoperative ileus in case bowel resections performed.  Finally I explained that if at the time of surgery we do not deem that the surgical risk is worth the benefit of a complete resection, if it is amenable to drain the cyst surgically we will do so which may relieve some of her mass-effect symptoms but carries with it and increased risk of cyst re-formation.  HPI: Holly Wells is a 49 year old P2 who is seen in consutlation at the request of Dr Benjie Karvonen for evaluation of an 11cm right ovarian cyst.   The patient has a medical history of a prior complex surgery performed in 2007.  I reviewed the operative note and discharge summary from that admission.  The patient was admitted between October 4 and November 21, 2005.  She was admitted with an abdominal wall abscess.  An exploratory laparotomy was performed via right lower quadrant incision.  It encountered the finding of an abdominal wall abscess and chronic abscess immediately adjacent to the cecum and possibly communicating with the cecum but no appendicitis.  The surgery included an incidental appendectomy, resection of the chronic inflammatory abscess, and oversewing of the cecum where it appeared to possibly communicate.  The surgeon, Dr. Rise Patience, felt strongly that this was secondary to a foreign body that might have been ingested and eroded  through the cecal wall.  He felt it might be a fishbone.  The patient had an open incision and this required wet-to-dry dressings for a prolonged period of time.  Prior to that she had an open cholecystectomy performed in 1991.  In 2015 in 2012 she was diagnosed with a right ovarian cyst.  It was unilocular.  It became symptomatic.  She was evaluated by  gynecologist, and referred to a GYN oncologist at [facility].  Due to her complex prior surgical history they elected to proceed with no surgical intervention due to concern for operative injury or complex postoperative recovery.  Additionally Ca1 25 drawn on October 23, 2010 was normal at 5.4.  She is continued to be treated with pain medications including tramadol and occasionally morphine tablets over the years due to the pain from the cyst.  She presented to me for consultation reporting symptoms of abdominal bloating, and intermittent right abdominal and pelvic pains that are so severe that she requires time off of work and to take morphine pills and tramadol for this pain.  She takes about 4 tablets of tramadol per day.  She reports that the pain shoots down her right leg.  It wraps around her back.  Occasionally she feels that shooting down to her vagina.  She cannot exercise or do other activities of vigorous nature due to the pain.  It is quality of life limiting.  She has FMLA to take intermittent days off due to the pain.  She reports that it is worse during menstrual cycles.  Menstrual cycles are otherwise regular and she denies menorrhagia.  Her most recent imaging was a CT scan of the chest abdomen and pelvis that was performed on November 06, 2018.  It revealed a uterus grossly normal in appearance with a 4 cm benign appearing cyst in the left adnexa most likely physiologic.  There is a cystic lesion in the right adnexa measuring 9.4 x 7.3 cm and showing several small mural nodules which show high attenuation and could be due to contrast enhancement or calcification.  It had increased in size from 5.7 x 4.5 cm on the prior study which was performed on February 27, 2013, and is suspicious for cystic ovarian neoplasm.  There was no peritoneal thickening ascites or carcinomatosis seen.  Her gynecologist did not feel that surgically she could be managed without the assistance of either GYN  oncology or colorectal surgery.  The patient's medical history significant for obesity.  She has a BMI of 34 kilograms per metered squared.  She has type II but diabetes mellitus on metformin and Invokana.  She reports her last hemoglobin A1c in the spring 2020 was 6.7%.  She also reported hypertension.  She denies tobacco or alcohol use.  She denies illicit substance use.  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  Currently due to the coronavirus she works from home.  She lives with HER-2 children who are adult age.  Interval Hx:  Her surgery was scheduled however postponed when preoperative hemoglobin A1c assessment was significantly elevated at 8.0 on 11/14/2018.  She subsequently saw an endocrinologist and was started on alternative regimens of long-acting insulin.  Most recent hemoglobin A1c had improved to 6.9%.  The patient is feeling much better and better blood glucose control and is enthusiastic about embarking on surgery.  Her symptoms remain stable.  Current Meds:  Outpatient Encounter Medications as of 04/29/2019  Medication Sig  . cetirizine-pseudoephedrine (ZYRTEC-D) 5-120 MG tablet  Take 1 tablet by mouth daily. (Patient taking differently: Take 1 tablet by mouth daily as needed for allergies. )  . erythromycin base (E-MYCIN) 500 MG tablet Take two tablets the day before surgery at 2pm, 3pm, 10pm  . FLOVENT HFA 110 MCG/ACT inhaler Inhale 2 puffs into the lungs at bedtime as needed (shortness of breath).   . hydrOXYzine (ATARAX/VISTARIL) 25 MG tablet Take 1 tablet (25 mg total) by mouth every 6 (six) hours.  Marland Kitchen ibuprofen (ADVIL) 200 MG tablet Take 200 mg by mouth every 6 (six) hours as needed for headache or moderate pain.  . INVOKANA 300 MG TABS tablet Take 300 mg by mouth daily.  Marland Kitchen Ketotifen Fumarate (ALLERGY EYE DROPS OP) Place 1 drop into both eyes daily as needed (allergies).  . liraglutide (VICTOZA) 18 MG/3ML SOPN Inject 0.6 mg into the skin.  Marland Kitchen  lisinopril-hydrochlorothiazide (PRINZIDE,ZESTORETIC) 20-25 MG per tablet Take 1 tablet by mouth daily.    . metFORMIN (GLUCOPHAGE) 1000 MG tablet Take 1,000 mg by mouth daily.   . Multiple Vitamin (MULTIVITAMIN WITH MINERALS) TABS tablet Take 1 tablet by mouth daily.  Marland Kitchen neomycin (MYCIFRADIN) 500 MG tablet Take two tablets the day before surgery at 2pm, 3pm, 10pm  . nitrofurantoin, macrocrystal-monohydrate, (MACROBID) 100 MG capsule Take 1 capsule (100 mg total) by mouth 2 (two) times daily.  . traMADol (ULTRAM) 50 MG tablet 1-2 tabs every 6 hours prn moderate pain (Patient taking differently: Take 50-100 mg by mouth every 6 (six) hours as needed for moderate pain or severe pain. )   No facility-administered encounter medications on file as of 04/29/2019.    Allergy:  Allergies  Allergen Reactions  . Bactrim [Sulfamethoxazole-Trimethoprim] Itching  . Other Anaphylaxis    Bamlanivimab: monoclonal AB for Covid , derived from IgG pooled human serum    Social Hx:   Social History   Socioeconomic History  . Marital status: Single    Spouse name: Not on file  . Number of children: Not on file  . Years of education: Not on file  . Highest education level: Not on file  Occupational History  . Not on file  Tobacco Use  . Smoking status: Former Research scientist (life sciences)  . Smokeless tobacco: Never Used  . Tobacco comment: 2005, SOCIAL ONLY  Substance and Sexual Activity  . Alcohol use: Yes    Comment: social  . Drug use: No  . Sexual activity: Yes    Birth control/protection: None    Comment: 1st intercourse 16 yo-5 partners  Other Topics Concern  . Not on file  Social History Narrative  . Not on file   Social Determinants of Health   Financial Resource Strain:   . Difficulty of Paying Living Expenses:   Food Insecurity:   . Worried About Charity fundraiser in the Last Year:   . Arboriculturist in the Last Year:   Transportation Needs:   . Film/video editor (Medical):   Marland Kitchen Lack of  Transportation (Non-Medical):   Physical Activity:   . Days of Exercise per Week:   . Minutes of Exercise per Session:   Stress:   . Feeling of Stress :   Social Connections:   . Frequency of Communication with Friends and Family:   . Frequency of Social Gatherings with Friends and Family:   . Attends Religious Services:   . Active Member of Clubs or Organizations:   . Attends Archivist Meetings:   Marland Kitchen Marital Status:   Intimate  Partner Violence:   . Fear of Current or Ex-Partner:   . Emotionally Abused:   Marland Kitchen Physically Abused:   . Sexually Abused:     Past Surgical Hx:  Past Surgical History:  Procedure Laterality Date  . APPENDECTOMY    . CHOLECYSTECTOMY     1991  . EXPLORATORY LAPAROTOMY W/ BOWEL RESECTION    . Stomach and bowel surg    . TONSILLECTOMY      Past Medical Hx:  Past Medical History:  Diagnosis Date  . Anxiety   . Asthma    ALLERGY INDUCED ASTHMA USES INHALER AT [facility]  . Complication of anesthesia    HAD CLOTTING AFTER APPENDIX AND BOWEL AND PARTIAL STOMACH SX YRS AGO AT [facility], NO SURGERY SINCE  . Diabetes mellitus    TYPE2  . 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 Family history of adverse reaction to anesthesia    FAMILY HX OF CLOTTING PROBLEMS AFTER SURGERY AND NEEDING BLOOD THINNERS  . Fibroid tumor   . History of kidney stones   . Hypertension   . Ovarian cyst   . Seasonal allergies     Past Gynecological History:  SVD x 2, 8 year history of right ovarian cyst.  No LMP recorded.  Family Hx:  Family History  Problem Relation Age of Onset  . Hypertension Mother   . Thyroid cancer Mother   . Diabetes Maternal Grandmother   . Hypertension Maternal Grandmother   . Breast cancer Maternal Grandmother 78  . Diabetes Maternal Grandfather   . Hypertension Maternal Grandfather   . Deep vein thrombosis Daughter     Review of Systems:  Constitutional  Feels well,   ENT Normal appearing ears and nares  bilaterally Skin/Breast  No rash, sores, jaundice, itching, dryness Cardiovascular  No chest pain, shortness of breath, or edema  Pulmonary  No cough or wheeze.  Gastro Intestinal  No nausea, vomitting, or diarrhoea. No bright red blood per rectum, + abdominal pain, occasional constipation.  Genito Urinary  No frequency, urgency, dysuria, + pelvic pain Musculo Skeletal  No myalgia, arthralgia, joint swelling or pain  Neurologic  No weakness, numbness, change in gait,  Psychology  No depression, anxiety, insomnia.   Vitals:  Blood pressure 102/67, pulse 90, temperature 98.3 F (36.8 C), temperature source Temporal, resp. rate 17, height 5' 5"  (1.651 m), weight 197 lb 9.6 oz (89.6 kg), SpO2 100 %.  Physical Exam: WD in NAD Neck  Supple NROM, without any enlargements.  Lymph Node Survey No cervical supraclavicular or inguinal adenopathy Cardiovascular  Pulse normal rate, regularity and rhythm. S1 and S2 normal.  Lungs  Clear to auscultation bilateraly, without wheezes/crackles/rhonchi. Good air movement.  Skin  No rash/lesions/breakdown  Psychiatry  Alert and oriented to person, place, and time  Abdomen  Normoactive bowel sounds, abdomen soft, non-tender and obese without evidence of hernia.  There are incision seen on the right abdomen with a subcostal margin incision, a lower punctate incision corresponding to a prior drain, and a right lower quadrant laparotomy incision. Back No CVA tenderness Genito Urinary  Vulva/vagina: Normal external female genitalia.  No lesions. No discharge or bleeding.  Bladder/urethra:  No lesions or masses, well supported bladder  Vagina: normal  Cervix: Normal appearing, no lesions.  Uterus: Small,  Minimally mobile, no parametrial involvement or nodularity.  Adnexa: large cystic mass in the right lower quadrant making it difficult to appreciate other structures Rectal  Good  tone, no masses no cul de sac nodularity.  Extremities  No  bilateral cyanosis, clubbing or edema.  Thereasa Solo, MD  04/29/2019, 4:32 PM

## 2019-05-27 NOTE — Patient Instructions (Addendum)
DUE TO COVID-19 ONLY ONE VISITOR IS ALLOWED TO COME WITH YOU AND STAY IN THE WAITING ROOM ONLY DURING PRE OP AND PROCEDURE DAY OF SURGERY. TWO  VISITOR MAY VISIT WITH YOU AFTER SURGERY IN YOUR PRIVATE ROOM DURING VISITING HOURS ONLY!   10a-8p  YOU NEED TO HAVE A COVID 19 TEST ON__4-30-21_____ @_1 :00______, THIS TEST MUST BE DONE BEFORE SURGERY, COME  [address], [place] , [address].  ([place]) ONCE YOUR COVID TEST IS COMPLETED, PLEASE BEGIN THE QUARANTINE INSTRUCTIONS AS OUTLINED IN YOUR HANDOUT.                Holly Wells  05/27/2019   Your procedure is scheduled on: 06-09-19   Report to [facility] Main  Entrance   Report to admitting at      [place]  AM     Call this number if you have problems the morning of surgery [phone]    Remember: NO SOLID FOOD AFTER MIDNIGHT THE NIGHT PRIOR TO SURGERY. NOTHING BY MOUTH EXCEPT CLEAR LIQUIDS UNTIL     0530 am . PLEASE FINISH G2 DRINK PER SURGEON ORDER  WHICH NEEDS TO BE COMPLETED AT    0530 am then nothing by mouth .  FOLLOW BOWEL PREP AND DIET ORDERS PER MD ORDERS  CLEAR LIQUID DIET   Foods Allowed                                                                                      Foods Excluded  Coffee and tea, regular and decaf   No creamer                                           liquids that you cannot  Plain Jell-O any favor except red or purple                                           see through such as: Fruit ices (not with fruit pulp)                                                                 milk, soups, orange juice  Iced Popsicles                                                                All solid food  Cranberry, grape and apple juices Sports drinks like Gatorade Lightly seasoned clear broth or consume(fat free) Sugar, honey syrup  Sample Menu Breakfast                                Lunch                                     Supper Cranberry  juice                    Beef broth                            Chicken broth Jell-O                                     Grape juice                           Apple juice Coffee or tea                        Jell-O                                      Popsicle                                                Coffee or tea                        Coffee or tea  _____________________________________________________________________     BRUSH YOUR TEETH MORNING OF SURGERY AND RINSE YOUR MOUTH OUT, NO CHEWING GUM CANDY OR MINTS.     Take these medicines the morning of surgery with A SIP OF WATER: hydroxyzine, lipitor, inhaler bring with you  [place]  HOLD DAY BEFORE SURGERY DO NOT TAKE ANY DIABETIC MEDICATIONS DAY OF YOUR SURGERY   How to Manage Your Diabetes Before and After Surgery  Why is it important to control my blood sugar before and after surgery? . Improving blood sugar levels before and after surgery helps healing and can limit problems. . A way of improving blood sugar control is eating a healthy diet by: o  Eating less sugar and carbohydrates o  Increasing activity/exercise o  Talking with your doctor about reaching your blood sugar goals . High blood sugars (greater than 180 mg/dL) can raise your risk of infections and slow your recovery, so you will need to focus on controlling your diabetes during the weeks before surgery. . Make sure that the doctor who takes care of your diabetes knows about your planned surgery including the date and location.  How do I manage my blood sugar before surgery? . Check your blood sugar at least 4 times a day, starting 2 days before surgery, to make sure that the level is not too high or low. o Check your blood sugar the morning of your surgery when you wake up and  every 2 hours until you get to the Short Stay unit. . If your blood sugar is less than 70 mg/dL, you will need to treat for low blood sugar: o Do not take insulin. o Treat a low  blood sugar (less than 70 mg/dL) with  cup of clear juice (cranberry or apple), 4 glucose tablets, OR glucose gel. o Recheck blood sugar in 15 minutes after treatment (to make sure it is greater than 70 mg/dL). If your blood sugar is not greater than 70 mg/dL on recheck, call [phone] for further instructions. . Report your blood sugar to the short stay nurse when you get to Short Stay.  . If you are admitted to the hospital after surgery: o Your blood sugar will be checked by the staff and you will probably be given insulin after surgery (instead of oral diabetes medicines) to make sure you have good blood sugar levels. o The goal for blood sugar control after surgery is 80-180 mg/dL.   WHAT DO I DO ABOUT MY DIABETES MEDICATION?  Marland Kitchen Do not take oral diabetes medicines (pills) the morning of surgery.  .       . The day of surgery, do not take other diabetes injectables, including Byetta (exenatide), Bydureon (exenatide ER), Victoza (liraglutide), or Trulicity (dulaglutide).                     You may not have any metal on your body including hair pins and              piercings  Do not wear jewelry, make-up, lotions, powders or perfumes, deodorant             Do not wear nail polish on your fingernails.  Do not shave  48 hours prior to surgery.                 Do not bring valuables to the hospital. [place].  Contacts, dentures or bridgework may not be worn into surgery.      Patients discharged the day of surgery will not be allowed to drive home. IF YOU ARE HAVING SURGERY AND GOING HOME THE SAME DAY, YOU MUST HAVE AN ADULT TO DRIVE YOU HOME AND BE WITH YOU FOR 24 HOURS. YOU MAY GO HOME BY TAXI OR UBER OR ORTHERWISE, BUT AN ADULT MUST ACCOMPANY YOU HOME AND STAY WITH YOU FOR 24 HOURS.  Name and phone number of your driver:  Special Instructions: N/A              Please read over the following fact sheets you were  given: _____________________________________________________________________             [facility] - Preparing for Surgery Before surgery, you can play an important role.  Because skin is not sterile, your skin needs to be as free of germs as possible.  You can reduce the number of germs on your skin by washing with CHG (chlorahexidine gluconate) soap before surgery.  CHG is an antiseptic cleaner which kills germs and bonds with the skin to continue killing germs even after washing. Please DO NOT use if you have an allergy to CHG or antibacterial soaps.  If your skin becomes reddened/irritated stop using the CHG and inform your nurse when you arrive at Short Stay. Do not shave (including legs and underarms) for at least 48 hours  prior to the first CHG shower.  You may shave your face/neck. Please follow these instructions carefully:  1.  Shower with CHG Soap the night before surgery and the  morning of Surgery.  2.  If you choose to wash your hair, wash your hair first as usual with your  normal  shampoo.  3.  After you shampoo, rinse your hair and body thoroughly to remove the  shampoo.                           4.  Use CHG as you would any other liquid soap.  You can apply chg directly  to the skin and wash                       Gently with a scrungie or clean washcloth.  5.  Apply the CHG Soap to your body ONLY FROM THE NECK DOWN.   Do not use on face/ open                           Wound or open sores. Avoid contact with eyes, ears mouth and genitals (private parts).                       Wash face,  Genitals (private parts) with your normal soap.             6.  Wash thoroughly, paying special attention to the area where your surgery  will be performed.  7.  Thoroughly rinse your body with warm water from the neck down.  8.  DO NOT shower/wash with your normal soap after using and rinsing off  the CHG Soap.                9.  Pat yourself dry with a clean towel.            10.  Wear  clean pajamas.            11.  Place clean sheets on your bed the night of your first shower and do not  sleep with pets. Day of Surgery : Do not apply any lotions/deodorants the morning of surgery.  Please wear clean clothes to the hospital/surgery center.  FAILURE TO FOLLOW THESE INSTRUCTIONS MAY RESULT IN THE CANCELLATION OF YOUR SURGERY PATIENT SIGNATURE_________________________________  NURSE SIGNATURE__________________________________  ________________________________________________________________________   Adam Phenix  An incentive spirometer is a tool that can help keep your lungs clear and active. This tool measures how well you are filling your lungs with each breath. Taking long deep breaths may help reverse or decrease the chance of developing breathing (pulmonary) problems (especially infection) following:  A long period of time when you are unable to move or be active. BEFORE THE PROCEDURE   If the spirometer includes an indicator to show your best effort, your nurse or respiratory therapist will set it to a desired goal.  If possible, sit up straight or lean slightly forward. Try not to slouch.  Hold the incentive spirometer in an upright position. INSTRUCTIONS FOR USE  1. Sit on the edge of your bed if possible, or sit up as far as you can in bed or on a chair. 2. Hold the incentive spirometer in an upright position. 3. Breathe out normally. 4. Place the mouthpiece in your mouth and seal your lips tightly around it. 5. Breathe in  slowly and as deeply as possible, raising the piston or the ball toward the top of the column. 6. Hold your breath for 3-5 seconds or for as long as possible. Allow the piston or ball to fall to the bottom of the column. 7. Remove the mouthpiece from your mouth and breathe out normally. 8. Rest for a few seconds and repeat Steps 1 through 7 at least 10 times every 1-2 hours when you are awake. Take your time and take a few normal  breaths between deep breaths. 9. The spirometer may include an indicator to show your best effort. Use the indicator as a goal to work toward during each repetition. 10. After each set of 10 deep breaths, practice coughing to be sure your lungs are clear. If you have an incision (the cut made at the time of surgery), support your incision when coughing by placing a pillow or rolled up towels firmly against it. Once you are able to get out of bed, walk around indoors and cough well. You may stop using the incentive spirometer when instructed by your caregiver.  RISKS AND COMPLICATIONS  Take your time so you do not get dizzy or light-headed.  If you are in pain, you may need to take or ask for pain medication before doing incentive spirometry. It is harder to take a deep breath if you are having pain. AFTER USE  Rest and breathe slowly and easily.  It can be helpful to keep track of a log of your progress. Your caregiver can provide you with a simple table to help with this. If you are using the spirometer at home, follow these instructions: [place] IF:   You are having difficultly using the spirometer.  You have trouble using the spirometer as often as instructed.  Your pain medication is not giving enough relief while using the spirometer.  You develop fever of 100.5 F (38.1 C) or higher. SEEK IMMEDIATE MEDICAL CARE IF:   You cough up bloody sputum that had not been present before.  You develop fever of 102 F (38.9 C) or greater.  You develop worsening pain at or near the incision site. MAKE SURE YOU:   Understand these instructions.  Will watch your condition.  Will get help right away if you are not doing well or get worse. Document Released: 06/04/2006 Document Revised: 04/16/2011 Document Reviewed: 08/05/2006 Exit[employer] Patient Information 2014 [employer], [place].   ________________________________________________________________________  WHAT IS A BLOOD  TRANSFUSION? Blood Transfusion Information  A transfusion is the replacement of blood or some of its parts. Blood is made up of multiple cells which provide different functions.  Red blood cells carry oxygen and are used for blood loss replacement.  White blood cells fight against infection.  Platelets control bleeding.  Plasma helps clot blood.  Other blood products are available for specialized needs, such as hemophilia or other clotting disorders. BEFORE THE TRANSFUSION  Who gives blood for transfusions?   Healthy volunteers who are fully evaluated to make sure their blood is safe. This is blood bank blood. Transfusion therapy is the safest it has ever been in the practice of medicine. Before blood is taken from a donor, a complete history is taken to make sure that person has no history of diseases nor engages in risky social behavior (examples are intravenous drug use or sexual activity with multiple partners). The donor's travel history is screened to minimize risk of transmitting infections, such as malaria. The donated blood is tested for  signs of infectious diseases, such as HIV and hepatitis. The blood is then tested to be sure it is compatible with you in order to minimize the chance of a transfusion reaction. If you or a relative donates blood, this is often done in anticipation of surgery and is not appropriate for emergency situations. It takes many days to process the donated blood. RISKS AND COMPLICATIONS Although transfusion therapy is very safe and saves many lives, the main dangers of transfusion include:   Getting an infectious disease.  Developing a transfusion reaction. This is an allergic reaction to something in the blood you were given. Every precaution is taken to prevent this. The decision to have a blood transfusion has been considered carefully by your caregiver before blood is given. Blood is not given unless the benefits outweigh the risks. AFTER THE  TRANSFUSION  Right after receiving a blood transfusion, you will usually feel much better and more energetic. This is especially true if your red blood cells have gotten low (anemic). The transfusion raises the level of the red blood cells which carry oxygen, and this usually causes an energy increase.  The nurse administering the transfusion will monitor you carefully for complications. HOME CARE INSTRUCTIONS  No special instructions are needed after a transfusion. You may find your energy is better. Speak with your caregiver about any limitations on activity for underlying diseases you may have. SEEK MEDICAL CARE IF:   Your condition is not improving after your transfusion.  You develop redness or irritation at the intravenous (IV) site. SEEK IMMEDIATE MEDICAL CARE IF:  Any of the following symptoms occur over the next 12 hours:  Shaking chills.  You have a temperature by mouth above 102 F (38.9 C), not controlled by medicine.  Chest, back, or muscle pain.  People around you feel you are not acting correctly or are confused.  Shortness of breath or difficulty breathing.  Dizziness and fainting.  You get a rash or develop hives.  You have a decrease in urine output.  Your urine turns a dark color or changes to pink, red, or brown. Any of the following symptoms occur over the next 10 days:  You have a temperature by mouth above 102 F (38.9 C), not controlled by medicine.  Shortness of breath.  Weakness after normal activity.  The white part of the eye turns yellow (jaundice).  You have a decrease in the amount of urine or are urinating less often.  Your urine turns a dark color or changes to pink, red, or brown. Document Released: 01/20/2000 Document Revised: 04/16/2011 Document Reviewed: 09/08/2007 [facility] Patient Information 2014 [place], [place].  _______________________________________________________________________

## 2019-05-27 NOTE — Progress Notes (Signed)
PCP - Kelton Pillar Cardiologist -   Chest x-ray - 1 view 01-06-19 epic EKG - 10-02-18 epic Stress Test -  ECHO -  Cardiac Cath -  hgba1c 04-24-19 epic 6.9  Sleep Study -  CPAP -   Fasting Blood Sugar - Does not check BS at home Checks Blood Sugar _____ times a day  Blood Thinner Instructions: Aspirin Instructions: Last Dose:  Anesthesia review: DM HTN  Patient denies shortness of breath, fever, cough and chest pain at PAT appointment    NONE   Patient verbalized understanding of instructions that were given to them at the PAT appointment. Patient was also instructed that they will need to review over the PAT instructions again at home before surgery.

## 2019-05-28 ENCOUNTER — Encounter (HOSPITAL_COMMUNITY): Payer: Self-pay

## 2019-05-28 ENCOUNTER — Other Ambulatory Visit: Payer: Self-pay

## 2019-05-28 ENCOUNTER — Encounter (HOSPITAL_COMMUNITY)
Admission: RE | Admit: 2019-05-28 | Discharge: 2019-05-28 | Disposition: A | Discharge status: Home / Self Care | Payer: No Typology Code available for payment source | Source: Ambulatory Visit | Attending: Gynecologic Oncology | Admitting: Gynecologic Oncology

## 2019-05-28 DIAGNOSIS — Z01818 Encounter for other preprocedural examination: Secondary | ICD-10-CM | POA: Diagnosis present

## 2019-05-28 HISTORY — DX: Unspecified osteoarthritis, unspecified site: M19.90

## 2019-05-28 HISTORY — DX: Headache, unspecified: R51.9

## 2019-05-28 HISTORY — DX: Depression, unspecified: F32.A

## 2019-05-28 HISTORY — DX: Gastro-esophageal reflux disease without esophagitis: K21.9

## 2019-06-02 ENCOUNTER — Other Ambulatory Visit: Payer: Self-pay

## 2019-06-02 ENCOUNTER — Ambulatory Visit (HOSPITAL_COMMUNITY)
Admission: RE | Admit: 2019-06-02 | Discharge: 2019-06-02 | Disposition: A | Discharge status: Home / Self Care | Payer: No Typology Code available for payment source | Source: Ambulatory Visit | Attending: Gynecologic Oncology | Admitting: Gynecologic Oncology

## 2019-06-02 DIAGNOSIS — N83201 Unspecified ovarian cyst, right side: Secondary | ICD-10-CM | POA: Insufficient documentation

## 2019-06-03 ENCOUNTER — Telehealth: Payer: Self-pay

## 2019-06-03 ENCOUNTER — Encounter (HOSPITAL_COMMUNITY)
Admission: RE | Admit: 2019-06-03 | Discharge: 2019-06-03 | Disposition: A | Discharge status: Home / Self Care | Payer: No Typology Code available for payment source | Source: Ambulatory Visit | Attending: Gynecologic Oncology | Admitting: Gynecologic Oncology

## 2019-06-03 DIAGNOSIS — Z01812 Encounter for preprocedural laboratory examination: Secondary | ICD-10-CM | POA: Insufficient documentation

## 2019-06-03 LAB — CBC
HCT: 42.5 % (ref 36.0–46.0)
Hemoglobin: 13.7 g/dL (ref 12.0–15.0)
MCH: 29.3 pg (ref 26.0–34.0)
MCHC: 32.2 g/dL (ref 30.0–36.0)
MCV: 91 fL (ref 80.0–100.0)
Platelets: 359 10*3/uL (ref 150–400)
RBC: 4.67 MIL/uL (ref 3.87–5.11)
RDW: 12.7 % (ref 11.5–15.5)
WBC: 10.2 10*3/uL (ref 4.0–10.5)
nRBC: 0 % (ref 0.0–0.2)

## 2019-06-03 LAB — COMPREHENSIVE METABOLIC PANEL
ALT: 14 U/L (ref 0–44)
AST: 17 U/L (ref 15–41)
Albumin: 4.3 g/dL (ref 3.5–5.0)
Alkaline Phosphatase: 78 U/L (ref 38–126)
Anion gap: 8 (ref 5–15)
BUN: 25 mg/dL — ABNORMAL HIGH (ref 6–20)
CO2: 26 mmol/L (ref 22–32)
Calcium: 9.3 mg/dL (ref 8.9–10.3)
Chloride: 102 mmol/L (ref 98–111)
Creatinine, Ser: 1 mg/dL (ref 0.44–1.00)
GFR calc Af Amer: >60 mL/min (ref >60)
GFR calc non Af Amer: >60 mL/min (ref >60)
Glucose, Bld: 131 mg/dL — ABNORMAL HIGH (ref 70–99)
Potassium: 4.4 mmol/L (ref 3.5–5.1)
Sodium: 136 mmol/L (ref 135–145)
Total Bilirubin: 0.5 mg/dL (ref 0.3–1.2)
Total Protein: 7.6 g/dL (ref 6.5–8.1)

## 2019-06-03 LAB — URINALYSIS, ROUTINE W REFLEX MICROSCOPIC
Bilirubin Urine: NEGATIVE
Glucose, UA: >=500 mg/dL — AB
Hgb urine dipstick: NEGATIVE
Ketones, ur: NEGATIVE mg/dL
Leukocytes,Ua: NEGATIVE
Nitrite: NEGATIVE
Protein, ur: NEGATIVE mg/dL
Specific Gravity, Urine: 1.026 (ref 1.005–1.030)
pH: 5 (ref 5.0–8.0)

## 2019-06-03 NOTE — Telephone Encounter (Signed)
Told Holly Wells that the US shows that the right ovarian cyst is now measuring 9.7 cm. It is slightly smaller but recommends proceeding with surgery. Pt verbalized understanding.

## 2019-06-05 ENCOUNTER — Other Ambulatory Visit (HOSPITAL_COMMUNITY)
Admission: RE | Admit: 2019-06-05 | Discharge: 2019-06-05 | Disposition: A | Discharge status: Home / Self Care | Payer: No Typology Code available for payment source | Source: Ambulatory Visit | Attending: Gynecologic Oncology | Admitting: Gynecologic Oncology

## 2019-06-05 DIAGNOSIS — Z01812 Encounter for preprocedural laboratory examination: Secondary | ICD-10-CM | POA: Insufficient documentation

## 2019-06-05 DIAGNOSIS — Z20822 Contact with and (suspected) exposure to covid-19: Secondary | ICD-10-CM | POA: Insufficient documentation

## 2019-06-06 LAB — SARS CORONAVIRUS 2 (TAT 6-24 HRS): SARS Coronavirus 2: NEGATIVE

## 2019-06-08 ENCOUNTER — Telehealth: Payer: Self-pay

## 2019-06-08 NOTE — Telephone Encounter (Signed)
Holly Wells stated that she understands the pre op instructions including the bowel prep instructions which she has begun. Told her that surgery was at 0830 and she needed to report to [facility] hospital admitting tomorrow at [place]. Pt verbalized understanding. She did not have any questions or concerns at this time.

## 2019-06-09 ENCOUNTER — Other Ambulatory Visit: Payer: Self-pay

## 2019-06-09 ENCOUNTER — Ambulatory Visit (HOSPITAL_COMMUNITY)
Admission: RE | Admit: 2019-06-09 | Discharge: 2019-06-09 | Disposition: A | Discharge status: Home / Self Care | Payer: No Typology Code available for payment source | Attending: Gynecologic Oncology | Admitting: Gynecologic Oncology

## 2019-06-09 ENCOUNTER — Ambulatory Visit (HOSPITAL_COMMUNITY): Payer: No Typology Code available for payment source | Admitting: Certified Registered"

## 2019-06-09 ENCOUNTER — Encounter (HOSPITAL_COMMUNITY): Payer: Self-pay | Admitting: Gynecologic Oncology

## 2019-06-09 ENCOUNTER — Encounter (HOSPITAL_COMMUNITY)
Admission: RE | Disposition: A | Discharge status: Home / Self Care | Payer: Self-pay | Source: Home / Self Care | Attending: Gynecologic Oncology

## 2019-06-09 DIAGNOSIS — Z7951 Long term (current) use of inhaled steroids: Secondary | ICD-10-CM | POA: Insufficient documentation

## 2019-06-09 DIAGNOSIS — Z7984 Long term (current) use of oral hypoglycemic drugs: Secondary | ICD-10-CM | POA: Insufficient documentation

## 2019-06-09 DIAGNOSIS — N8312 Corpus luteum cyst of left ovary: Secondary | ICD-10-CM

## 2019-06-09 DIAGNOSIS — N83201 Unspecified ovarian cyst, right side: Secondary | ICD-10-CM | POA: Diagnosis present

## 2019-06-09 DIAGNOSIS — N83202 Unspecified ovarian cyst, left side: Secondary | ICD-10-CM

## 2019-06-09 DIAGNOSIS — Z833 Family history of diabetes mellitus: Secondary | ICD-10-CM | POA: Insufficient documentation

## 2019-06-09 DIAGNOSIS — F17200 Nicotine dependence, unspecified, uncomplicated: Secondary | ICD-10-CM | POA: Diagnosis not present

## 2019-06-09 DIAGNOSIS — Z881 Allergy status to other antibiotic agents status: Secondary | ICD-10-CM | POA: Insufficient documentation

## 2019-06-09 DIAGNOSIS — D270 Benign neoplasm of right ovary: Secondary | ICD-10-CM | POA: Diagnosis not present

## 2019-06-09 DIAGNOSIS — K660 Peritoneal adhesions (postprocedural) (postinfection): Secondary | ICD-10-CM

## 2019-06-09 DIAGNOSIS — E119 Type 2 diabetes mellitus without complications: Secondary | ICD-10-CM | POA: Diagnosis not present

## 2019-06-09 DIAGNOSIS — Z79899 Other long term (current) drug therapy: Secondary | ICD-10-CM | POA: Diagnosis not present

## 2019-06-09 DIAGNOSIS — Z6832 Body mass index (BMI) 32.0-32.9, adult: Secondary | ICD-10-CM | POA: Diagnosis not present

## 2019-06-09 DIAGNOSIS — F419 Anxiety disorder, unspecified: Secondary | ICD-10-CM | POA: Diagnosis not present

## 2019-06-09 DIAGNOSIS — Z8249 Family history of ischemic heart disease and other diseases of the circulatory system: Secondary | ICD-10-CM | POA: Diagnosis not present

## 2019-06-09 DIAGNOSIS — I10 Essential (primary) hypertension: Secondary | ICD-10-CM | POA: Diagnosis not present

## 2019-06-09 DIAGNOSIS — Z87892 Personal history of anaphylaxis: Secondary | ICD-10-CM | POA: Diagnosis not present

## 2019-06-09 DIAGNOSIS — E669 Obesity, unspecified: Secondary | ICD-10-CM | POA: Diagnosis not present

## 2019-06-09 DIAGNOSIS — M1990 Unspecified osteoarthritis, unspecified site: Secondary | ICD-10-CM | POA: Diagnosis not present

## 2019-06-09 DIAGNOSIS — N838 Other noninflammatory disorders of ovary, fallopian tube and broad ligament: Secondary | ICD-10-CM | POA: Insufficient documentation

## 2019-06-09 DIAGNOSIS — J45909 Unspecified asthma, uncomplicated: Secondary | ICD-10-CM | POA: Insufficient documentation

## 2019-06-09 HISTORY — PX: ROBOTIC ASSISTED SALPINGO OOPHERECTOMY: SHX6082

## 2019-06-09 HISTORY — PX: LYSIS OF ADHESION: SHX5961

## 2019-06-09 LAB — GLUCOSE, CAPILLARY
Glucose-Capillary: 119 mg/dL — ABNORMAL HIGH (ref 70–99)
Glucose-Capillary: 153 mg/dL — ABNORMAL HIGH (ref 70–99)

## 2019-06-09 LAB — TYPE AND SCREEN
ABO/RH(D): A POS
Antibody Screen: NEGATIVE

## 2019-06-09 LAB — PREGNANCY, URINE: Preg Test, Ur: NEGATIVE

## 2019-06-09 SURGERY — SALPINGO-OOPHORECTOMY, ROBOT-ASSISTED
Anesthesia: General | Site: Abdomen | Laterality: Right

## 2019-06-09 MED ORDER — STERILE WATER FOR IRRIGATION IR SOLN
Status: DC | PRN
  Administered 2019-06-09: 1000 mL

## 2019-06-09 MED ORDER — LACTATED RINGERS IV SOLN: INTRAVENOUS | Status: DC | PRN

## 2019-06-09 MED ORDER — HYDROMORPHONE HCL 1 MG/ML IJ SOLN: 0.2500 mg | INTRAMUSCULAR | Status: DC | PRN

## 2019-06-09 MED ORDER — FENTANYL CITRATE (PF) 100 MCG/2ML IJ SOLN
INTRAMUSCULAR | Status: DC | PRN
  Administered 2019-06-09: 25 ug via INTRAVENOUS
  Administered 2019-06-09: 50 ug via INTRAVENOUS
  Administered 2019-06-09: 25 ug via INTRAVENOUS
  Administered 2019-06-09: 50 ug via INTRAVENOUS

## 2019-06-09 MED ORDER — BUPIVACAINE HCL 0.25 % IJ SOLN
INTRAMUSCULAR | Status: DC | PRN
  Administered 2019-06-09: 23 mL

## 2019-06-09 MED ORDER — SUGAMMADEX SODIUM 200 MG/2ML IV SOLN
INTRAVENOUS | Status: DC | PRN
  Administered 2019-06-09: 200 mg via INTRAVENOUS
  Administered 2019-06-09: 125 mg via INTRAVENOUS

## 2019-06-09 MED ORDER — MIDAZOLAM HCL 2 MG/2ML IJ SOLN
INTRAMUSCULAR | Status: AC
  Filled 2019-06-09: qty 2

## 2019-06-09 MED ORDER — LACTATED RINGERS IV SOLN: INTRAVENOUS | Status: DC

## 2019-06-09 MED ORDER — KETOROLAC TROMETHAMINE 30 MG/ML IJ SOLN: 30.0000 mg | Freq: Four times a day (QID) | INTRAMUSCULAR | Status: DC

## 2019-06-09 MED ORDER — ACETAMINOPHEN 500 MG PO TABS
1000.0000 mg | ORAL_TABLET | ORAL | Status: AC
  Administered 2019-06-09: 1000 mg via ORAL
  Filled 2019-06-09: qty 2

## 2019-06-09 MED ORDER — PROPOFOL 10 MG/ML IV BOLUS
INTRAVENOUS | Status: DC | PRN
  Administered 2019-06-09: 200 mg via INTRAVENOUS

## 2019-06-09 MED ORDER — PHENYLEPHRINE HCL (PRESSORS) 10 MG/ML IV SOLN
INTRAVENOUS | Status: AC
  Filled 2019-06-09: qty 1

## 2019-06-09 MED ORDER — SODIUM CHLORIDE (PF) 0.9 % IJ SOLN
INTRAMUSCULAR | Status: AC
  Filled 2019-06-09: qty 50

## 2019-06-09 MED ORDER — ALBUMIN HUMAN 5 % IV SOLN: INTRAVENOUS | Status: DC | PRN

## 2019-06-09 MED ORDER — DEXAMETHASONE SODIUM PHOSPHATE 10 MG/ML IJ SOLN
INTRAMUSCULAR | Status: DC | PRN
  Administered 2019-06-09: 4 mg via INTRAVENOUS

## 2019-06-09 MED ORDER — PHENYLEPHRINE 40 MCG/ML (10ML) SYRINGE FOR IV PUSH (FOR BLOOD PRESSURE SUPPORT)
PREFILLED_SYRINGE | INTRAVENOUS | Status: AC
  Filled 2019-06-09: qty 30

## 2019-06-09 MED ORDER — KETOROLAC TROMETHAMINE 30 MG/ML IJ SOLN
30.0000 mg | Freq: Four times a day (QID) | INTRAMUSCULAR | Status: DC
  Administered 2019-06-09: 30 mg via INTRAVENOUS

## 2019-06-09 MED ORDER — OXYCODONE HCL 5 MG PO TABS: 5.0000 mg | ORAL_TABLET | Freq: Once | ORAL | Status: DC | PRN

## 2019-06-09 MED ORDER — ONDANSETRON HCL 4 MG/2ML IJ SOLN
INTRAMUSCULAR | Status: AC
  Filled 2019-06-09: qty 4

## 2019-06-09 MED ORDER — SODIUM CHLORIDE 0.9 % IV SOLN
2.0000 g | INTRAVENOUS | Status: AC
  Administered 2019-06-09 (×2): 2 g via INTRAVENOUS
  Filled 2019-06-09: qty 2

## 2019-06-09 MED ORDER — FENTANYL CITRATE (PF) 250 MCG/5ML IJ SOLN
INTRAMUSCULAR | Status: AC
  Filled 2019-06-09: qty 5

## 2019-06-09 MED ORDER — KETOROLAC TROMETHAMINE 15 MG/ML IJ SOLN
15.0000 mg | INTRAMUSCULAR | Status: AC
  Administered 2019-06-09: 15 mg via INTRAVENOUS
  Filled 2019-06-09 (×2): qty 1

## 2019-06-09 MED ORDER — OXYCODONE HCL 5 MG/5ML PO SOLN: 5.0000 mg | Freq: Once | ORAL | Status: DC | PRN

## 2019-06-09 MED ORDER — SODIUM CHLORIDE 0.9 % IV SOLN
2.0000 g | Freq: Once | INTRAVENOUS | Status: DC
  Filled 2019-06-09: qty 2

## 2019-06-09 MED ORDER — ALBUMIN HUMAN 5 % IV SOLN
INTRAVENOUS | Status: AC
  Filled 2019-06-09: qty 250

## 2019-06-09 MED ORDER — ROCURONIUM BROMIDE 100 MG/10ML IV SOLN
INTRAVENOUS | Status: DC | PRN
  Administered 2019-06-09: 20 mg via INTRAVENOUS
  Administered 2019-06-09: 60 mg via INTRAVENOUS
  Administered 2019-06-09: 20 mg via INTRAVENOUS

## 2019-06-09 MED ORDER — DEXTROSE 5 % IV SOLN
2.0000 g | Freq: Once | INTRAVENOUS | Status: DC
  Filled 2019-06-09: qty 2

## 2019-06-09 MED ORDER — DEXAMETHASONE SODIUM PHOSPHATE 4 MG/ML IJ SOLN: 4.0000 mg | INTRAMUSCULAR | Status: DC

## 2019-06-09 MED ORDER — ONDANSETRON HCL 4 MG/2ML IJ SOLN
INTRAMUSCULAR | Status: DC | PRN
  Administered 2019-06-09 (×2): 4 mg via INTRAVENOUS

## 2019-06-09 MED ORDER — KETOROLAC TROMETHAMINE 30 MG/ML IJ SOLN
INTRAMUSCULAR | Status: AC
  Filled 2019-06-09: qty 1

## 2019-06-09 MED ORDER — KETOROLAC TROMETHAMINE 30 MG/ML IJ SOLN: 30.0000 mg | Freq: Once | INTRAMUSCULAR | Status: DC | PRN

## 2019-06-09 MED ORDER — PROMETHAZINE HCL 25 MG/ML IJ SOLN: 6.2500 mg | INTRAMUSCULAR | Status: DC | PRN

## 2019-06-09 MED ORDER — LIDOCAINE 2% (20 MG/ML) 5 ML SYRINGE
INTRAMUSCULAR | Status: DC | PRN
  Administered 2019-06-09: 60 mg via INTRAVENOUS

## 2019-06-09 MED ORDER — MEPERIDINE HCL 50 MG/ML IJ SOLN: 6.2500 mg | INTRAMUSCULAR | Status: DC | PRN

## 2019-06-09 MED ORDER — GABAPENTIN 300 MG PO CAPS
300.0000 mg | ORAL_CAPSULE | ORAL | Status: AC
  Administered 2019-06-09: 300 mg via ORAL
  Filled 2019-06-09: qty 1

## 2019-06-09 MED ORDER — PROPOFOL 10 MG/ML IV BOLUS
INTRAVENOUS | Status: AC
  Filled 2019-06-09: qty 20

## 2019-06-09 MED ORDER — SCOPOLAMINE 1 MG/3DAYS TD PT72
1.0000 | MEDICATED_PATCH | TRANSDERMAL | Status: DC
  Administered 2019-06-09: 1.5 mg via TRANSDERMAL
  Filled 2019-06-09: qty 1

## 2019-06-09 MED ORDER — MIDAZOLAM HCL 5 MG/5ML IJ SOLN
INTRAMUSCULAR | Status: DC | PRN
  Administered 2019-06-09: 2 mg via INTRAVENOUS

## 2019-06-09 MED ORDER — KETOROLAC TROMETHAMINE 30 MG/ML IJ SOLN: 30.0000 mg | Freq: Once | INTRAMUSCULAR | Status: DC

## 2019-06-09 MED ORDER — LACTATED RINGERS IR SOLN
Status: DC | PRN
  Administered 2019-06-09: 1000 mL

## 2019-06-09 MED ORDER — PHENYLEPHRINE HCL-NACL 10-0.9 MG/250ML-% IV SOLN
INTRAVENOUS | Status: DC | PRN
  Administered 2019-06-09: 20 ug/min via INTRAVENOUS

## 2019-06-09 MED ORDER — ALVIMOPAN 12 MG PO CAPS
12.0000 mg | ORAL_CAPSULE | Freq: Once | ORAL | Status: AC
  Administered 2019-06-09: 12 mg via ORAL
  Filled 2019-06-09: qty 1

## 2019-06-09 MED ORDER — PHENYLEPHRINE 40 MCG/ML (10ML) SYRINGE FOR IV PUSH (FOR BLOOD PRESSURE SUPPORT)
PREFILLED_SYRINGE | INTRAVENOUS | Status: DC | PRN
  Administered 2019-06-09: 40 ug via INTRAVENOUS
  Administered 2019-06-09: 80 ug via INTRAVENOUS
  Administered 2019-06-09: 40 ug via INTRAVENOUS
  Administered 2019-06-09: 80 ug via INTRAVENOUS
  Administered 2019-06-09: 120 ug via INTRAVENOUS

## 2019-06-09 MED ORDER — BUPIVACAINE HCL 0.25 % IJ SOLN
INTRAMUSCULAR | Status: AC
  Filled 2019-06-09: qty 1

## 2019-06-09 SURGICAL SUPPLY — 109 items
APPLICATOR SURGIFLO ENDO (HEMOSTASIS) IMPLANT
ATTRACTOMAT 16X20 MAGNETIC DRP (DRAPES) IMPLANT
BAG LAPAROSCOPIC 12 15 PORT 16 (BASKET) ×2 IMPLANT
BAG RETRIEVAL 12/15 (BASKET) ×3
BLADE EXTENDED COATED 6.5IN (ELECTRODE) IMPLANT
BLADE SURG SZ10 CARB STEEL (BLADE) IMPLANT
CELLS DAT CNTRL 66122 CELL SVR (MISCELLANEOUS) IMPLANT
CHLORAPREP W/TINT 26 (MISCELLANEOUS) ×6 IMPLANT
CLIP VESOCCLUDE LG 6/CT (CLIP) IMPLANT
CLIP VESOCCLUDE MED 6/CT (CLIP) IMPLANT
CLIP VESOCCLUDE MED LG 6/CT (CLIP) IMPLANT
CNTNR URN SCR LID CUP LEK RST (MISCELLANEOUS) IMPLANT
CONT SPEC 4OZ STRL OR WHT (MISCELLANEOUS)
COVER BACK TABLE 60X90IN (DRAPES) ×3 IMPLANT
COVER TIP SHEARS 8 DVNC (MISCELLANEOUS) ×2 IMPLANT
COVER TIP SHEARS 8MM DA VINCI (MISCELLANEOUS) ×3
COVER WAND RF STERILE (DRAPES) IMPLANT
DECANTER SPIKE VIAL GLASS SM (MISCELLANEOUS) ×3 IMPLANT
DERMABOND ADVANCED (GAUZE/BANDAGES/DRESSINGS) ×1
DERMABOND ADVANCED .7 DNX12 (GAUZE/BANDAGES/DRESSINGS) ×2 IMPLANT
DRAPE ARM DVNC X/XI (DISPOSABLE) ×8 IMPLANT
DRAPE COLUMN DVNC XI (DISPOSABLE) ×2 IMPLANT
DRAPE DA VINCI XI ARM (DISPOSABLE) ×12
DRAPE DA VINCI XI COLUMN (DISPOSABLE) ×3
DRAPE INCISE IOBAN 66X45 STRL (DRAPES) IMPLANT
DRAPE SHEET LG 3/4 BI-LAMINATE (DRAPES) ×3 IMPLANT
DRAPE SURG IRRIG POUCH 19X23 (DRAPES) ×3 IMPLANT
DRAPE WARM FLUID 44X44 (DRAPES) IMPLANT
DRSG OPSITE POSTOP 4X10 (GAUZE/BANDAGES/DRESSINGS) IMPLANT
DRSG OPSITE POSTOP 4X6 (GAUZE/BANDAGES/DRESSINGS) IMPLANT
DRSG OPSITE POSTOP 4X8 (GAUZE/BANDAGES/DRESSINGS) IMPLANT
ELECT REM PT RETURN 15FT ADLT (MISCELLANEOUS) ×3 IMPLANT
GAUZE 4X4 16PLY RFD (DISPOSABLE) IMPLANT
GLOVE BIO SURGEON STRL SZ 6 (GLOVE) ×12 IMPLANT
GLOVE BIO SURGEON STRL SZ 6.5 (GLOVE) ×6 IMPLANT
GOWN STRL REUS W/ TWL LRG LVL3 (GOWN DISPOSABLE) ×8 IMPLANT
GOWN STRL REUS W/TWL LRG LVL3 (GOWN DISPOSABLE) ×12
HEMOSTAT ARISTA ABSORB 3G PWDR (HEMOSTASIS) IMPLANT
HOLDER FOLEY CATH W/STRAP (MISCELLANEOUS) IMPLANT
IRRIG SUCT STRYKERFLOW 2 WTIP (MISCELLANEOUS) ×3
IRRIGATION SUCT STRKRFLW 2 WTP (MISCELLANEOUS) ×2 IMPLANT
KIT BASIN (CUSTOM PROCEDURE TRAY) IMPLANT
KIT PROCEDURE DA VINCI SI (MISCELLANEOUS)
KIT PROCEDURE DVNC SI (MISCELLANEOUS) IMPLANT
KIT TURNOVER KIT A (KITS) IMPLANT
LIGASURE IMPACT 36 18CM CVD LR (INSTRUMENTS) IMPLANT
LOOP VESSEL MAXI BLUE (MISCELLANEOUS) IMPLANT
MANIPULATOR UTERINE 4.5 ZUMI (MISCELLANEOUS) ×3 IMPLANT
NEEDLE HYPO 22GX1.5 SAFETY (NEEDLE) ×6 IMPLANT
NEEDLE SPNL 18GX3.5 QUINCKE PK (NEEDLE) ×3 IMPLANT
NEEDLE SPNL 22GX3.5 QUINCKE BK (NEEDLE) ×3 IMPLANT
NS IRRIG 1000ML POUR BTL (IV SOLUTION) IMPLANT
OBTURATOR OPTICAL STANDARD 8MM (TROCAR) ×3
OBTURATOR OPTICAL STND 8 DVNC (TROCAR) ×2
OBTURATOR OPTICALSTD 8 DVNC (TROCAR) ×2 IMPLANT
PACK GENERAL/GYN (CUSTOM PROCEDURE TRAY) IMPLANT
PACK ROBOT GYN WLCUSTOM (TRAY / TRAY PROCEDURE) ×3 IMPLANT
PAD POSITIONING PINK XL (MISCELLANEOUS) ×3 IMPLANT
PENCIL SMOKE EVACUATOR (MISCELLANEOUS) IMPLANT
PORT ACCESS TROCAR AIRSEAL 12 (TROCAR) ×2 IMPLANT
PORT ACCESS TROCAR AIRSEAL 5M (TROCAR) ×1
POUCH SPECIMEN RETRIEVAL 10MM (ENDOMECHANICALS) ×3 IMPLANT
RELOAD PROXIMATE 75MM BLUE (ENDOMECHANICALS) IMPLANT
RELOAD PROXIMATE TA60MM BLUE (ENDOMECHANICALS) IMPLANT
RETRACTOR WND ALEXIS 25 LRG (MISCELLANEOUS) IMPLANT
RTRCTR WOUND ALEXIS 18CM MED (MISCELLANEOUS)
RTRCTR WOUND ALEXIS 25CM LRG (MISCELLANEOUS)
SCISSORS LAP 5X45 EPIX DISP (ENDOMECHANICALS) ×3 IMPLANT
SCRUB CHG 4% DYNA-HEX 4OZ (MISCELLANEOUS) ×3 IMPLANT
SEAL CANN UNIV 5-8 DVNC XI (MISCELLANEOUS) ×6 IMPLANT
SEAL XI 5MM-8MM UNIVERSAL (MISCELLANEOUS) ×9
SEALER TISSUE G2 CVD JAW 45CM (ENDOMECHANICALS) ×3 IMPLANT
SET TRI-LUMEN FLTR TB AIRSEAL (TUBING) ×3 IMPLANT
SHEET LAVH (DRAPES) IMPLANT
SLEEVE SUCTION CATH 165 (SLEEVE) IMPLANT
SPONGE LAP 18X18 RF (DISPOSABLE) IMPLANT
STAPLER GUN LINEAR PROX 60 (STAPLE) IMPLANT
STAPLER PROXIMATE 75MM BLUE (STAPLE) IMPLANT
STAPLER VISISTAT 35W (STAPLE) IMPLANT
SURGIFLO W/THROMBIN 8M KIT (HEMOSTASIS) IMPLANT
SUT MNCRL AB 4-0 PS2 18 (SUTURE) IMPLANT
SUT PDS AB 1 TP1 96 (SUTURE) IMPLANT
SUT SILK 3 0 SH CR/8 (SUTURE) IMPLANT
SUT VIC AB 0 CT1 27 (SUTURE)
SUT VIC AB 0 CT1 27XBRD ANTBC (SUTURE) IMPLANT
SUT VIC AB 0 CT1 36 (SUTURE) IMPLANT
SUT VIC AB 2-0 CT1 27 (SUTURE)
SUT VIC AB 2-0 CT1 36 (SUTURE) IMPLANT
SUT VIC AB 2-0 CT1 TAPERPNT 27 (SUTURE) IMPLANT
SUT VIC AB 2-0 CT2 27 (SUTURE) IMPLANT
SUT VIC AB 2-0 SH 27 (SUTURE)
SUT VIC AB 2-0 SH 27X BRD (SUTURE) IMPLANT
SUT VIC AB 3-0 CT1 27 (SUTURE)
SUT VIC AB 3-0 CT1 TAPERPNT 27 (SUTURE) IMPLANT
SUT VIC AB 3-0 CTX 36 (SUTURE) IMPLANT
SUT VIC AB 3-0 SH 18 (SUTURE) IMPLANT
SUT VIC AB 3-0 SH 27 (SUTURE) ×3
SUT VIC AB 3-0 SH 27X BRD (SUTURE) ×2 IMPLANT
SUT VICRYL 4-0 PS2 18IN ABS (SUTURE) ×6 IMPLANT
SYR 10ML LL (SYRINGE) IMPLANT
SYR 30ML LL (SYRINGE) IMPLANT
TOWEL OR 17X26 10 PK STRL BLUE (TOWEL DISPOSABLE) ×3 IMPLANT
TOWEL OR NON WOVEN STRL DISP B (DISPOSABLE) ×3 IMPLANT
TRAP SPECIMEN MUCUS 40CC (MISCELLANEOUS) ×3 IMPLANT
TRAY FOLEY MTR SLVR 16FR STAT (SET/KITS/TRAYS/PACK) ×3 IMPLANT
TROCAR XCEL NON-BLD 5MMX100MML (ENDOMECHANICALS) IMPLANT
UNDERPAD 30X36 HEAVY ABSORB (UNDERPADS AND DIAPERS) ×3 IMPLANT
WATER STERILE IRR 1000ML POUR (IV SOLUTION) ×3 IMPLANT
YANKAUER SUCT BULB TIP 10FT TU (MISCELLANEOUS) IMPLANT

## 2019-06-09 NOTE — Anesthesia Procedure Notes (Signed)
Procedure Name: Intubation Date/Time: 06/09/2019 8:50 AM Performed by: Gwyndolyn Saxon, CRNA Pre-anesthesia Checklist: Patient identified, Emergency Drugs available, Suction available and Patient being monitored Patient Re-evaluated:Patient Re-evaluated prior to induction Oxygen Delivery Method: Circle system utilized Preoxygenation: Pre-oxygenation with 100% oxygen Induction Type: IV induction Ventilation: Mask ventilation without difficulty and Oral airway inserted - appropriate to patient size Laryngoscope Size: Mac and 3 Grade View: Grade I Tube type: Oral Tube size: 7.0 mm Number of attempts: 1 Airway Equipment and Method: Patient positioned with wedge pillow and Stylet Placement Confirmation: ETT inserted through vocal cords under direct vision,  positive ETCO2 and breath sounds checked- equal and bilateral Secured at: 20 cm Tube secured with: Tape Dental Injury: Teeth and Oropharynx as per pre-operative assessment

## 2019-06-09 NOTE — Op Note (Signed)
OPERATIVE NOTE  Date: 06/09/19  Preoperative Diagnosis: right ovarian cyst, history of prior complex surgery on right colon, intraperitoneal adhesions   Postoperative Diagnosis:  Same and left ovarian cyst  Procedure(s) Performed: Robotic-assisted laparoscopic right salpingo-oophorectomy and left ovarian cystectomy and lysis of adhesions.  Surgeon: Everitt Amber, M.D.  Assistant Surgeon: Lahoma Crocker M.D. (an MD assistant was necessary for tissue manipulation, management of robotic instrumentation, retraction and positioning due to the complexity of the case and hospital policies).   Anesthesia: Gen. endotracheal.  Specimens: right tube and ovary, left ovarian cyst, pelvic washings  Estimated Blood Loss: 20 mL. Blood Replacement: None  Complications: none  Indication for Procedure:  The patient reported right abdominal pain and had an 11cm simple cyst on ultrasound and CT scan. She had a history of complex surgery from a perforated cecum with abdominal wall abscess and ascending colon phlegmon.  Operative Findings: Dense adhesions between entirety of the omentum and anterior abdominal wall and right lateral wall.  Ascending colon/hepatic flexure densely adherent to anterior and right lateral abdominal wall.  11cm simple appearing right ovarian cyst (torsed) and left ovarian cyst (2cm) adherent to posterior cul de sac.  Frozen pathology was consistent with benign cyst.  Procedure: The patient's taken to the operating room and placed under general endotracheal anesthesia testing difficulty. She is placed in a dorsolithotomy position and cervical acromial pad was placed. The arms were tucked with care taken to pad the olecranon process. And prepped and draped in usual sterile fashion. A uterine manipulator (zumi) was placed vaginally. A 65mm incision was made in the left upper quadrant palmer's point and a 5 mm Optiview trocar used to enter the abdomen under direct visualization. With  entry into the abdomen and then maintenance of 15 mm of mercury the patient was placed in Trendelenburg position.  It was apparent that dense adhesions were preventing visualization of the mid and right abdomen and therefore the two left lateral ports were placed in the mid abdomen. These were able to be placed under direct visualization and were atraumatic.The endoshears, enseal and bowel graspers were used through these lateral ports to carefully take the adhesions of omentum down from the anterior abdominal wall and lateral wall. This took approximately 60 minutes due to the extensive nature of the adhesions and the involvement of hepatic flexure/ascending colon. Careful inspection of the colon confirmed that the hepatic flexure was densely fused with the anterior-right abdominal wall. A decision was made not to release additional colon, as it was not necessary for pelvic exposure and there was an increased risk for bowel injury. The adhesions that remained at the end of the procedure were in the right upper quadrant.  At this point additional ports could be safely placed under direct visualization free from adhesions or bowel. An incision was made 5cm above the umbilicus and a 63mm trochar was placed through this site. An incision was made 8cm lateral to this in the right abdomen measuring 64mm. The robot was docked.  The abdomen was inspected as was the pelvis.  Pelvic washings were obtained. An incision was made on the right pelvic side wall peritoneum parallel to the IP ligament and the retroperitoneal space entered. The right ureter was identified and the para-rectal space was developed. A window was created in the right broad ligament above the ureter. The right infundibulopelvic vessels were skeletonized cauterized and transected. The utero-ovarian ligaments similarly were cauterized and transected. Specimen was placed in an Endo Catch bag.  The left  ovary was visualized, it was adherent to the  posterior uterus and cul de sac. It was carefully dissected from its attachments posteriorly and a left ovarian cystectomy was performed by peeling the cyst from the underlying stroma. Hemostasis was obtained in the ovarian bed with bipolar.   The robot was redocked to better visualize the right upper quadrant. Additional robotic sharp dissection was performed to mobilize enough of the hepatic flexure of the colon such that it could be confirmed that no colonic injury was present. The colon remained somewhat adherent to the anterior abdominal wall.   The abdomen was copiously irrigated and drained and all operative sites inspected and hemostasis was assured  The robot was undocked. The contents of the left Endo Catch bag were removed from the abdominal cavity through the LUQ incision. In a similar fashion the contents of the right Endo Catch bag were morcellated within the bag to facilitate removal from the abdominal cavity. A frozen section confirmed a benign right ovarian cyst.  The ports were all remove. The fascial closure at the supraumbilical incision and left upper quadrant port was made with 0 Vicryl.  All incisions were closed with a running subcuticular Monocryl suture. Dermabond was applied. Sponge, lap and needle counts were correct x 3.    The patient had sequential compression devices for VTE prophylaxis.         Disposition: PACU          Condition: stable  Donaciano Eva, MD

## 2019-06-09 NOTE — Transfer of Care (Signed)
Immediate Anesthesia Transfer of Care Note  Patient: Holly Wells  Procedure(s) Performed: XI ROBOTIC ASSISTED RIGHT SALPINGO OOPHORECTOMY/LEFT OVARIAN CYST (Right Abdomen) LYSIS OF ADHESION (N/A )  Patient Location: PACU  Anesthesia Type:General  Level of Consciousness: drowsy and patient cooperative  Airway & Oxygen Therapy: Patient Spontanous Breathing and Patient connected to face mask oxygen  Post-op Assessment: Report given to RN and Post -op Vital signs reviewed and stable  Post vital signs: Reviewed and stable  Last Vitals:  Vitals Value Taken Time  BP 148/66 06/09/19 1121  Temp    Pulse 89 06/09/19 1128  Resp 22 06/09/19 1128  SpO2 100 % 06/09/19 1128  Vitals shown include unvalidated device data.  Last Pain:  Vitals:   06/09/19 0731  TempSrc:   PainSc: 0-No pain         Complications: No apparent anesthesia complications

## 2019-06-09 NOTE — Discharge Instructions (Signed)
06/09/2019  Return to work: 4 weeks  Activity: 1. Be up and out of the bed during the day.  Take a nap if needed.  You may walk up steps but be careful and use the hand rail.  Stair climbing will tire you more than you think, you may need to stop part way and rest.   2. No lifting or straining for 4 weeks.  3. No driving for 1-2 weeks.  Do Not drive if you are taking narcotic pain medicine.  4. Shower daily.  Use soap and water on your incision and pat dry; don't rub.   5. No sexual activity and nothing in the vagina for 4 weeks.  Medications:  - Take ibuprofen and tylenol first line for pain control. Take these regularly (every 6 hours) to decrease the build up of pain.  - If necessary, for severe pain not relieved by ibuprofen, take percocet.  - While taking percocet you should take sennakot every night to reduce the likelihood of constipation. If this causes diarrhea, stop its use.  Diet: 1. Low sodium Heart Healthy Diet is recommended.  2. It is safe to use a laxative if you have difficulty moving your bowels.   Wound Care: 1. Keep clean and dry.  Shower daily.  Reasons to call the Doctor:   Fever - Oral temperature greater than 100.4 degrees Fahrenheit  Foul-smelling vaginal discharge  Difficulty urinating  Nausea and vomiting  Increased pain at the site of the incision that is unrelieved with pain medicine.  Difficulty breathing with or without chest pain  New calf pain especially if only on one side  Sudden, continuing increased vaginal bleeding with or without clots.   Follow-up: 1. See Everitt Amber in 3-4 weeks.  Contacts: For questions or concerns you should contact:  Dr. Everitt Amber at [phone] After hours and on week-ends call [phone] and ask to speak to the physician on call for Gynecologic Oncology

## 2019-06-09 NOTE — Anesthesia Postprocedure Evaluation (Signed)
Anesthesia Post Note  Patient: Holly Wells  Procedure(s) Performed: XI ROBOTIC ASSISTED RIGHT SALPINGO OOPHORECTOMY/LEFT OVARIAN CYST (Right Abdomen) LYSIS OF ADHESION (N/A )     Patient location during evaluation: PACU Anesthesia Type: General Level of consciousness: awake and alert, oriented and patient cooperative Pain management: pain level controlled Vital Signs Assessment: post-procedure vital signs reviewed and stable Respiratory status: spontaneous breathing, nonlabored ventilation and respiratory function stable Cardiovascular status: blood pressure returned to baseline and stable Postop Assessment: no apparent nausea or vomiting Anesthetic complications: no    Last Vitals:  Vitals:   06/09/19 1245 06/09/19 1305  BP: 139/88 (!) 138/97  Pulse: 88 83  Resp: 18 16  Temp: (!) 36.4 C (!) 36.4 C  SpO2: 93% 97%    Last Pain:  Vitals:   06/09/19 1245  TempSrc:   PainSc: [place]

## 2019-06-09 NOTE — H&P (Signed)
H&P Note: Gyn-Onc  Consult was requested by Dr. Benjie Karvonen for Holly evaluation of Holly Wells 49 y.o. female  CC:  Right ovarian cyst  Assessment/Plan:  Holly Wells  is a 49 y.o.  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   Holly Wells cyst is symptomatic.  It is limiting her quality of life.  There are no good nonsurgical alternatives to attempt to ameliorate her symptoms.  She requires chronic opioids for this pain. We have previously had extensive counseling about perioperative risks which are substantially higher in this Wells who has a complex prior surgical history and diabetes. We revisited these today.  She understands this including her increased risk for bowel injury and conversion to laparotomy and prolonged admission or postop complications.  Holly plan surgery would be a right salpingo-oophorectomy.  I explained that sometimes adhesive disease means that hysterectomy and BSO needs to be performed at Holly time of a unilateral oophorectomy because of fused pelvic organs.  She is in agreement that if this is surgically necessary for technical reasons that we will proceed with total hysterectomy with BSO.   I explained that pelvic adhesive disease frequently means that intestinal organs are fused with Holly gynecologic structures.  This may require bowel resection in order to achieve removal of Holly ovary.  I explained that bowel resection is associated with risks of stricture, leak, perforation, reoperation, and stoma formation either temporary or permanent.  We will provide her with a bowel prep preoperatively to decrease Holly risk of postoperative infection and to maximize Holly chance that a left-sided colon resection could be reanastomosed primarily.  We will provide her with Entereg preoperatively to decrease Holly risk of postoperative ileus in case bowel resections performed.  Finally I explained  that if at Holly time of surgery we do not deem that Holly surgical risk is worth Holly benefit of a complete resection, if it is amenable to drain Holly cyst surgically we will do so which may relieve some of her mass-effect symptoms but carries with it and increased risk of cyst re-formation.  HPI: Holly Wells is a 49 year old P2 who is seen in consutlation at Holly request of Dr Benjie Karvonen for evaluation of an 11cm right ovarian cyst.   Holly Wells has a medical history of a prior complex surgery performed in 2007.  I reviewed Holly operative note and discharge summary from that admission.  Holly Wells was admitted between October 4 and November 21, 2005.  She was admitted with an abdominal wall abscess.  An exploratory laparotomy was performed via right lower quadrant incision.  It encountered Holly finding of an abdominal wall abscess and chronic abscess immediately adjacent to Holly cecum and possibly communicating with Holly cecum but no appendicitis.  Holly surgery included an incidental appendectomy, resection of Holly chronic inflammatory abscess, and oversewing of Holly cecum where it appeared to possibly communicate.  Holly surgeon, Dr. Rise Patience, felt strongly that this was secondary to a foreign body that might have been ingested and eroded through Holly cecal wall.  He felt it might be a fishbone.  Holly Wells had an open incision and this required wet-to-dry dressings for a prolonged period of time.  Prior to that she had an open cholecystectomy performed in 1991.  In 2015 in 2012 she was diagnosed with a right ovarian cyst.  It was unilocular.  It became symptomatic.  She was evaluated by  gynecologist, and referred to a GYN oncologist at [facility].  Due to her complex prior surgical history they elected to proceed with no surgical intervention due to concern for operative injury or complex postoperative recovery.  Additionally Ca1 25 drawn on October 23, 2010 was normal at 5.4.  She is continued to be treated  with pain medications including tramadol and occasionally morphine tablets over Holly years due to Holly pain from Holly cyst.  She presented to me for consultation reporting symptoms of abdominal bloating, and intermittent right abdominal and pelvic pains that are so severe that she requires time off of work and to take morphine pills and tramadol for this pain.  She takes about 4 tablets of tramadol per day.  She reports that Holly pain shoots down her right leg.  It wraps around her back.  Occasionally she feels that shooting down to her vagina.  She cannot exercise or do other activities of vigorous nature due to Holly pain.  It is quality of life limiting.  She has FMLA to take intermittent days off due to Holly pain.  She reports that it is worse during menstrual cycles.  Menstrual cycles are otherwise regular and she denies menorrhagia.  Her most recent imaging was a CT scan of Holly chest abdomen and pelvis that was performed on November 06, 2018.  It revealed a uterus grossly normal in appearance with a 4 cm benign appearing cyst in Holly left adnexa most likely physiologic.  There is a cystic lesion in Holly right adnexa measuring 9.4 x 7.3 cm and showing several small mural nodules which show high attenuation and could be due to contrast enhancement or calcification.  It had increased in size from 5.7 x 4.5 cm on Holly prior study which was performed on February 27, 2013, and is suspicious for cystic ovarian neoplasm.  There was no peritoneal thickening ascites or carcinomatosis seen.  Her gynecologist did not feel that surgically she could be managed without Holly assistance of either GYN oncology or colorectal surgery.  Holly Wells's medical history significant for obesity.  She has a BMI of 34 kilograms per metered squared.  She has type II but diabetes mellitus on metformin and Invokana.  She reports her last hemoglobin A1c in Holly spring 2020 was 6.7%.  She also reported hypertension.  She denies tobacco or  alcohol use.  She denies illicit substance use.  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  Currently due to Holly coronavirus she works from home.  She lives with HER-2 children who are adult age.  Interval Hx:  Her surgery was scheduled however postponed when preoperative hemoglobin A1c assessment was significantly elevated at 8.0 on 11/14/2018.  She subsequently saw an endocrinologist and was started on alternative regimens of long-acting insulin.  Most recent hemoglobin A1c had improved to 6.9%.  Holly Wells is feeling much better and better blood glucose control and is enthusiastic about embarking on surgery.  Her symptoms remain stable.  Current Meds:  Outpatient Encounter Medications as of 04/29/2019  Medication Sig  . cetirizine-pseudoephedrine (ZYRTEC-D) 5-120 MG tablet Take 1 tablet by mouth daily. (Wells taking differently: Take 1 tablet by mouth daily as needed for allergies. )  . erythromycin base (E-MYCIN) 500 MG tablet Take two tablets Holly day before surgery at 2pm, 3pm, 10pm  . FLOVENT HFA 110 MCG/ACT inhaler Inhale 2 puffs into Holly lungs at bedtime as needed (shortness of breath).   . hydrOXYzine (ATARAX/VISTARIL) 25 MG tablet Take  1 tablet (25 mg total) by mouth every 6 (six) hours.  Marland Kitchen ibuprofen (ADVIL) 200 MG tablet Take 200 mg by mouth every 6 (six) hours as needed for headache or moderate pain.  . INVOKANA 300 MG TABS tablet Take 300 mg by mouth daily.  Marland Kitchen Ketotifen Fumarate (ALLERGY EYE DROPS OP) Place 1 drop into both eyes daily as needed (allergies).  . liraglutide (VICTOZA) 18 MG/3ML SOPN Inject 0.6 mg into Holly skin.  Marland Kitchen lisinopril-hydrochlorothiazide (PRINZIDE,ZESTORETIC) 20-25 MG per tablet Take 1 tablet by mouth daily.    . metFORMIN (GLUCOPHAGE) 1000 MG tablet Take 1,000 mg by mouth daily.   . Multiple Vitamin (MULTIVITAMIN WITH MINERALS) TABS tablet Take 1 tablet by mouth daily.  Marland Kitchen neomycin (MYCIFRADIN) 500 MG tablet Take two tablets Holly day before  surgery at 2pm, 3pm, 10pm  . nitrofurantoin, macrocrystal-monohydrate, (MACROBID) 100 MG capsule Take 1 capsule (100 mg total) by mouth 2 (two) times daily.  . traMADol (ULTRAM) 50 MG tablet 1-2 tabs every 6 hours prn moderate pain (Wells taking differently: Take 50-100 mg by mouth every 6 (six) hours as needed for moderate pain or severe pain. )   No facility-administered encounter medications on file as of 04/29/2019.    Allergy:  Allergies  Allergen Reactions  . Bactrim [Sulfamethoxazole-Trimethoprim] Itching  . Other Anaphylaxis    Bamlanivimab: monoclonal AB for Covid , derived from IgG pooled human serum    Social Hx:   Social History   Socioeconomic History  . Marital status: Single    Spouse name: Not on file  . Number of children: Not on file  . Years of education: Not on file  . Highest education level: Not on file  Occupational History  . Not on file  Tobacco Use  . Smoking status: Current Every Day Smoker  . Smokeless tobacco: Never Used  . Tobacco comment:  quit 2005, SOCIAL ONLY   , started back in June 2020  Substance and Sexual Activity  . Alcohol use: Yes    Comment: social  . Drug use: No  . Sexual activity: Yes    Birth control/protection: None  Other Topics Concern  . Not on file  Social History Narrative  . Not on file   Social Determinants of Health   Financial Resource Strain:   . Difficulty of Paying Living Expenses:   Food Insecurity:   . Worried About Charity fundraiser in Holly Last Year:   . Arboriculturist in [place] Last Year:   Transportation Needs:   . Film/video editor (Medical):   Marland Kitchen Lack of Transportation (Non-Medical):   Physical Activity:   . Days of Exercise per Week:   . Minutes of Exercise per Session:   Stress:   . Feeling of Stress :   Social Connections:   . Frequency of Communication with Friends and Family:   . Frequency of Social Gatherings with Friends and Family:   . Attends Religious Services:   . Active  Member of Clubs or Organizations:   . Attends Archivist Meetings:   Marland Kitchen Marital Status:   Intimate Partner Violence:   . Fear of Current or Ex-Partner:   . Emotionally Abused:   Marland Kitchen Physically Abused:   . Sexually Abused:     Past Surgical Hx:  Past Surgical History:  Procedure Laterality Date  . APPENDECTOMY    . CHOLECYSTECTOMY     1991  . EXPLORATORY LAPAROTOMY W/ BOWEL RESECTION    . Stomach  and bowel surg    . TONSILLECTOMY      Past Medical Hx:  Past Medical History:  Diagnosis Date  . Anxiety   . Arthritis   . Asthma    ALLERGY INDUCED ASTHMA USES INHALER AT HS PRN  . Depression   . Diabetes mellitus    TYPE2  . 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 Family history of adverse reaction to anesthesia    FAMILY HX OF CLOTTING PROBLEMS AFTER SURGERY AND NEEDING BLOOD THINNERS  . Fibroid tumor   . GERD (gastroesophageal reflux disease)   . Headache   . Hypertension   . Ovarian cyst   . Seasonal allergies     Past Gynecological History:  SVD x 2, 8 year history of right ovarian cyst.  Wells's last menstrual period was 05/18/2019 (approximate).  Family Hx:  Family History  Problem Relation Age of Onset  . Hypertension Mother   . Thyroid cancer Mother   . Diabetes Maternal Grandmother   . Hypertension Maternal Grandmother   . Breast cancer Maternal Grandmother 78  . Diabetes Maternal Grandfather   . Hypertension Maternal Grandfather   . Deep vein thrombosis Daughter     Review of Systems:  Constitutional  Feels well,   ENT Normal appearing ears and nares bilaterally Skin/Breast  No rash, sores, jaundice, itching, dryness Cardiovascular  No chest pain, shortness of breath, or edema  Pulmonary  No cough or wheeze.  Gastro Intestinal  No nausea, vomitting, or diarrhoea. No bright red blood per rectum, + abdominal pain, occasional constipation.  Genito Urinary  No frequency, urgency, dysuria, + pelvic pain Musculo  Skeletal  No myalgia, arthralgia, joint swelling or pain  Neurologic  No weakness, numbness, change in gait,  Psychology  No depression, anxiety, insomnia.   Vitals:  Blood pressure 112/76, pulse 90, temperature 98.1 F (36.7 C), temperature source Oral, resp. rate 18, last menstrual period 05/18/2019, SpO2 100 %.  Physical Exam: WD in NAD Neck  Supple NROM, without any enlargements.  Lymph Node Survey No cervical supraclavicular or inguinal adenopathy Cardiovascular  Pulse normal rate, regularity and rhythm. S1 and S2 normal.  Lungs  Clear to auscultation bilateraly, without wheezes/crackles/rhonchi. Good air movement.  Skin  No rash/lesions/breakdown  Psychiatry  Alert and oriented to person, place, and time  Abdomen  Normoactive bowel sounds, abdomen soft, non-tender and obese without evidence of hernia.  There are incision seen on Holly right abdomen with a subcostal margin incision, a lower punctate incision corresponding to a prior drain, and a right lower quadrant laparotomy incision. Back No CVA tenderness Genito Urinary  Vulva/vagina: Normal external female genitalia.  No lesions. No discharge or bleeding.  Bladder/urethra:  No lesions or masses, well supported bladder  Vagina: normal  Cervix: Normal appearing, no lesions.  Uterus: Small,  Minimally mobile, no parametrial involvement or nodularity.  Adnexa: large cystic mass in Holly right lower quadrant making it difficult to appreciate other structures Rectal  Good tone, no masses no cul de sac nodularity.  Extremities  No bilateral cyanosis, clubbing or edema.  Thereasa Solo, MD  06/09/2019, 7:04 AM

## 2019-06-09 NOTE — Anesthesia Preprocedure Evaluation (Signed)
Anesthesia Evaluation  Patient identified by MRN, date of birth, ID band Patient awake    Reviewed: Allergy & Precautions, NPO status , Patient's Chart, lab work & pertinent test results  Airway Mallampati: II  TM Distance: >3 FB Neck ROM: Full    Dental no notable dental hx. (+) Dental Advisory Given   Pulmonary asthma , Current Smoker,    Pulmonary exam normal breath sounds clear to auscultation       Cardiovascular hypertension, Pt. on medications negative cardio ROS Normal cardiovascular exam Rhythm:Regular Rate:Normal     Neuro/Psych  Headaches, PSYCHIATRIC DISORDERS Anxiety Depression    GI/Hepatic negative GI ROS, Neg liver ROS,   Endo/Other  diabetes, Type 2, Oral Hypoglycemic AgentsObesity BMI 33  Renal/GU negative Renal ROS  Female GU complaint Right ovarian cyst     Musculoskeletal  (+) Arthritis , Osteoarthritis,    Abdominal (+) + obese,   Peds negative pediatric ROS (+)  Hematology negative hematology ROS (+) hct 42.5, plt 359   Anesthesia Other Findings   Reproductive/Obstetrics negative OB ROS                             Anesthesia Physical Anesthesia Plan  ASA: III  Anesthesia Plan: General   Post-op Pain Management:    Induction: Intravenous  PONV Risk Score and Plan: 2 and Ondansetron, Dexamethasone, Midazolam, Scopolamine patch - Pre-op and Treatment may vary due to age or medical condition  Airway Management Planned: Oral ETT  Additional Equipment: None  Intra-op Plan:   Post-operative Plan: Extubation in OR  Informed Consent: I have reviewed the patients History and Physical, chart, labs and discussed the procedure including the risks, benefits and alternatives for the proposed anesthesia with the patient or authorized representative who has indicated his/her understanding and acceptance.     Dental advisory given  Plan Discussed with:  CRNA  Anesthesia Plan Comments:         Anesthesia Quick Evaluation

## 2019-06-10 ENCOUNTER — Telehealth: Payer: Self-pay

## 2019-06-10 LAB — CYTOLOGY - NON PAP

## 2019-06-10 NOTE — Telephone Encounter (Signed)
Holly Wells stated that she was able to pickup the Oxycodone and senokot-s. She just took her Oxycodone tablet. Told her to keep using the Ibuprofen alt with tylenol and use the Oxycodone prn. Pt verbalized understanding.

## 2019-06-10 NOTE — Telephone Encounter (Signed)
Holly Wells states that she is eating, drinking,and urinating well. She has had a BM. Afebrile Incisions are D&I She has been using Ibuprofen 800 mg alt with extra strength tylenol.  Her pain level currently is an 8/10. Holly Wells states that she needs something stronger.  Holly Wells never p/u the Oxycodone 5 mg tabs as well as the senokot-s sent in on 04-29-19. She will call her pharmacy and see if the narcotic can still be p/u. If not, she is to call the office so a new prescription can be sent in. Pt aware of her post op appointment on 07-01-19 as well as the office number [phone] if she has any questions or concerns.

## 2019-06-11 ENCOUNTER — Telehealth: Payer: Self-pay

## 2019-06-11 LAB — SURGICAL PATHOLOGY

## 2019-06-11 NOTE — Telephone Encounter (Signed)
Told Holly Wells that the final pathology showed no cancer per [name], NP.  Keep post op appointment as scheduled.

## 2019-06-11 NOTE — Telephone Encounter (Signed)
Patient had surgery on 5/6.  She called to report that she has some bright red bleeding that is similar to her cycle and some cramping that is relieved with oxycodone and ibuprofen.  Pt was having cycles before surgery.  Spoke with NP about patient's symptoms.  Per NP pt should watch for any worsening symptoms such saturating a pad within 30 minutes or uncontrolled bleeding.  Advised patient that cycle can be a little heavier due to surgery.  Patient voiced understanding of above and knows to call office with further issues.

## 2019-06-17 ENCOUNTER — Telehealth: Payer: Self-pay

## 2019-06-17 NOTE — Telephone Encounter (Signed)
Holly Wells called to see if she would be cleared to go to her dentist since she just had surgery 06-09-19. She has a sore tooth and thinks it is fractured after eating an apple. Told Holly Wells that Dr. Denman George said that she is fine to go to the dentist to have the tooth evaluated and repaired.

## 2019-06-22 ENCOUNTER — Telehealth: Payer: Self-pay

## 2019-06-22 NOTE — Telephone Encounter (Addendum)
Ms Leffers states that the vaginal bleeding was heavy on Sat and Sunday. Today it has lightened up. It seems that she has had another cycle. Instructed her to call if bleeding becomes heavy again per [facility].  Told her that she needed to call the dentist that gave her the ATB for her tooth to request something for the yeast infection per [facility]. Pt verbalized understanding.

## 2019-06-30 NOTE — Progress Notes (Signed)
Follow-up Note: Gyn-Onc  Consult was requested by Dr. Benjie Karvonen for the evaluation of Holly Wells 49 y.o. female  CC:  Chief Complaint  Patient presents with  . Left ovarian cyst    Follow Up  . Right ovarian cyst    Assessment/Plan:  Ms. Holly Wells  is a 49 y.o.  year old who is s/p robotic assisted right salpingo-oophorectomy and lysis of adhesions on Jun 09, 2019 for a torsed right ovarian cyst and a small corpus luteum in the left ovary.  We discussed the benign nature of this lesion and the recommendation that no specific follow-up is necessary for this, however she should continue to follow-up with Dr Benjie Karvonen for routine annual wellness checks and cervical cancer screening.   HPI: Ms. Holly Wells is a 49 year old P2 who is seen in consutlation at the request of Dr Benjie Karvonen for evaluation of an 11cm right ovarian cyst.   The patient has a medical history of a prior complex surgery performed in 2007.  I reviewed the operative note and discharge summary from that admission.  The patient was admitted between October 4 and November 21, 2005.  She was admitted with an abdominal wall abscess.  An exploratory laparotomy was performed via right lower quadrant incision.  It encountered the finding of an abdominal wall abscess and chronic abscess immediately adjacent to the cecum and possibly communicating with the cecum but no appendicitis.  The surgery included an incidental appendectomy, resection of the chronic inflammatory abscess, and oversewing of the cecum where it appeared to possibly communicate.  The surgeon, Dr. Rise Patience, felt strongly that this was secondary to a foreign body that might have been ingested and eroded through the cecal wall.  He felt it might be a fishbone.  The patient had an open incision and this required wet-to-dry dressings for a prolonged period of time.  Prior to that she had an open cholecystectomy performed in 1991.  In 2015 in 2012 she was diagnosed with a right  ovarian cyst.  It was unilocular.  It became symptomatic.  She was evaluated by gynecologist, and referred to a GYN oncologist at [facility].  Due to her complex prior surgical history they elected to proceed with no surgical intervention due to concern for operative injury or complex postoperative recovery.  Additionally Ca1 25 drawn on October 23, 2010 was normal at 5.4.  She is continued to be treated with pain medications including tramadol and occasionally morphine tablets over the years due to the pain from the cyst.  She presented to me for consultation reporting symptoms of abdominal bloating, and intermittent right abdominal and pelvic pains that are so severe that she requires time off of work and to take morphine pills and tramadol for this pain.  She takes about 4 tablets of tramadol per day.  She reports that the pain shoots down her right leg.  It wraps around her back.  Occasionally she feels that shooting down to her vagina.  She cannot exercise or do other activities of vigorous nature due to the pain.  It is quality of life limiting.  She has FMLA to take intermittent days off due to the pain.  She reports that it is worse during menstrual cycles.  Menstrual cycles are otherwise regular and she denies menorrhagia.  Her most recent imaging was a CT scan of the chest abdomen and pelvis that was performed on November 06, 2018.  It revealed a uterus grossly normal in appearance with a  4 cm benign appearing cyst in the left adnexa most likely physiologic.  There is a cystic lesion in the right adnexa measuring 9.4 x 7.3 cm and showing several small mural nodules which show high attenuation and could be due to contrast enhancement or calcification.  It had increased in size from 5.7 x 4.5 cm on the prior study which was performed on February 27, 2013, and is suspicious for cystic ovarian neoplasm.  There was no peritoneal thickening ascites or carcinomatosis seen.  Her gynecologist did not feel  that surgically she could be managed without the assistance of either GYN oncology or colorectal surgery.  The patient's medical history significant for obesity.  She has a BMI of 34 kilograms per metered squared.  She has type II but diabetes mellitus on metformin and Invokana.  She reports her last hemoglobin A1c in the spring 2020 was 6.7%.  She also reported hypertension.  She denies tobacco or alcohol use.  She denies illicit substance use.  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  Currently due to the coronavirus she works from home.  She lives with HER-2 children who are adult age.  Her surgery was scheduled however postponed when preoperative hemoglobin A1c assessment was significantly elevated at 8.0 on 11/14/2018.  She subsequently saw an endocrinologist and was started on alternative regimens of long-acting insulin.  Most recent hemoglobin A1c had improved to 6.9%.  Interval Hx:  On Jun 09, 2019 she underwent robotic assisted right salpingo-oophorectomy and left ovarian cystectomy with lysis of adhesions. Intraoperative findings were significant for dense adhesions between the entirety the omentum and anterior abdominal wall and right lateral abdominal wall.  The ascending colon and hepatic flexure were densely adherent to the anterior and right lateral abdominal wall.  There was an 11 cm simple appearing right ovarian cyst which had torsed and a 2 cm benign-appearing left ovarian cyst which was adherent to the posterior cul-de-sac.  Frozen section pathology was consistent with a benign cyst. Surgery was challenging due to the extreme abdominal wall adhesions, however uncomplicated.  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  Since surgery she has done very well with no major issues since surgery.  Her blood glucoses remain well controlled.   Current Meds:  Outpatient Encounter Medications as of  07/01/2019  Medication Sig  . atorvastatin (LIPITOR) 10 MG tablet Take 10 mg by mouth daily.  Marland Kitchen ELDERBERRY PO Take 1 tablet by mouth daily.  Marland Kitchen erythromycin base (E-MYCIN) 500 MG tablet Take two tablets the day before surgery at 2pm, 3pm, 10pm  . FLOVENT HFA 110 MCG/ACT inhaler Inhale 2 puffs into the lungs at bedtime as needed (shortness of breath).   . hydrOXYzine (ATARAX/VISTARIL) 25 MG tablet Take 1 tablet (25 mg total) by mouth every 6 (six) hours.  Marland Kitchen ibuprofen (ADVIL) 200 MG tablet Take 400 mg by mouth every 8 (eight) hours as needed for headache or moderate pain.   Marland Kitchen ibuprofen (ADVIL) 800 MG tablet Take 1 tablet (800 mg total) by mouth every 8 (eight) hours as needed for moderate pain. For AFTER surgery  . INVOKANA 300 MG TABS tablet Take 300 mg by mouth daily.  Marland Kitchen Ketotifen Fumarate (ALLERGY EYE DROPS OP) Place 1 drop into both eyes daily as needed (allergies).  . liraglutide (VICTOZA) 18 MG/3ML SOPN Inject 0.6 mg into the skin daily.   Marland Kitchen lisinopril-hydrochlorothiazide (PRINZIDE,ZESTORETIC) 20-25 MG per tablet Take  1 tablet by mouth daily.    Marland Kitchen loratadine (CLARITIN) 10 MG tablet Take 10 mg by mouth daily.  . metFORMIN (GLUCOPHAGE) 1000 MG tablet Take 1,000 mg by mouth in the morning and at bedtime.   . Multiple Vitamin (MULTIVITAMIN WITH MINERALS) TABS tablet Take 1 tablet by mouth daily.  Marland Kitchen neomycin (MYCIFRADIN) 500 MG tablet Take two tablets the day before surgery at 2pm, 3pm, 10pm  . nitrofurantoin, macrocrystal-monohydrate, (MACROBID) 100 MG capsule Take 1 capsule (100 mg total) by mouth 2 (two) times daily.  Marland Kitchen oxyCODONE (OXY IR/ROXICODONE) 5 MG immediate release tablet Take 1 tablet (5 mg total) by mouth every 4 (four) hours as needed for severe pain. For AFTER surgery, do not take and drive  . senna-docusate (SENOKOT-S) 8.6-50 MG tablet Take 2 tablets by mouth at bedtime. For AFTER surgery, do not take if having diarrhea  . traMADol (ULTRAM) 50 MG tablet 1-2 tabs every 6 hours prn  moderate pain (Patient taking differently: Take 50-100 mg by mouth every 6 (six) hours as needed for moderate pain or severe pain. )   No facility-administered encounter medications on file as of 07/01/2019.    Allergy:  Allergies  Allergen Reactions  . Bactrim [Sulfamethoxazole-Trimethoprim] Itching  . Other Anaphylaxis    Bamlanivimab: monoclonal AB for Covid , derived from IgG pooled human serum    Social Hx:   Social History   Socioeconomic History  . Marital status: Single    Spouse name: Not on file  . Number of children: Not on file  . Years of education: Not on file  . Highest education level: Not on file  Occupational History  . Not on file  Tobacco Use  . Smoking status: Current Every Day Smoker  . Smokeless tobacco: Never Used  . Tobacco comment:  quit 2005, SOCIAL ONLY   , started back in June 2020  Substance and Sexual Activity  . Alcohol use: Yes    Comment: social  . Drug use: No  . Sexual activity: Yes    Birth control/protection: None  Other Topics Concern  . Not on file  Social History Narrative  . Not on file   Social Determinants of Health   Financial Resource Strain:   . Difficulty of Paying Living Expenses:   Food Insecurity:   . Worried About Charity fundraiser in the Last Year:   . Arboriculturist in the Last Year:   Transportation Needs:   . Film/video editor (Medical):   Marland Kitchen Lack of Transportation (Non-Medical):   Physical Activity:   . Days of Exercise per Week:   . Minutes of Exercise per Session:   Stress:   . Feeling of Stress :   Social Connections:   . Frequency of Communication with Friends and Family:   . Frequency of Social Gatherings with Friends and Family:   . Attends Religious Services:   . Active Member of Clubs or Organizations:   . Attends Archivist Meetings:   Marland Kitchen Marital Status:   Intimate Partner Violence:   . Fear of Current or Ex-Partner:   . Emotionally Abused:   Marland Kitchen Physically Abused:   .  Sexually Abused:     Past Surgical Hx:  Past Surgical History:  Procedure Laterality Date  . APPENDECTOMY    . CHOLECYSTECTOMY     1991  . EXPLORATORY LAPAROTOMY W/ BOWEL RESECTION    . LYSIS OF ADHESION N/A 06/09/2019   Procedure: LYSIS OF ADHESION;  Surgeon: Everitt Amber, MD;  Location: [facility];  Service: Gynecology;  Laterality: N/A;  . ROBOTIC ASSISTED SALPINGO OOPHERECTOMY Right 06/09/2019   Procedure: XI ROBOTIC ASSISTED RIGHT SALPINGO OOPHORECTOMY/LEFT OVARIAN CYST;  Surgeon: Everitt Amber, MD;  Location: [facility];  Service: Gynecology;  Laterality: Right;  . Stomach and bowel surg    . TONSILLECTOMY      Past Medical Hx:  Past Medical History:  Diagnosis Date  . Anxiety   . Arthritis   . Asthma    ALLERGY INDUCED ASTHMA USES INHALER AT HS PRN  . Depression   . Diabetes mellitus    TYPE2  . 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 Family history of adverse reaction to anesthesia    FAMILY HX OF CLOTTING PROBLEMS AFTER SURGERY AND NEEDING BLOOD THINNERS  . Fibroid tumor   . GERD (gastroesophageal reflux disease)   . Headache   . Hypertension   . Ovarian cyst   . Seasonal allergies     Past Gynecological History:  SVD x 2, 8 year history of right ovarian cyst.  No LMP recorded.  Family Hx:  Family History  Problem Relation Age of Onset  . Hypertension Mother   . Thyroid cancer Mother   . Diabetes Maternal Grandmother   . Hypertension Maternal Grandmother   . Breast cancer Maternal Grandmother 78  . Diabetes Maternal Grandfather   . Hypertension Maternal Grandfather   . Deep vein thrombosis Daughter     Review of Systems:  Constitutional  Feels well,   ENT Normal appearing ears and nares bilaterally Skin/Breast  No rash, sores, jaundice, itching, dryness Cardiovascular  No chest pain, shortness of breath, or edema  Pulmonary  No cough or wheeze.  Gastro Intestinal  No nausea, vomitting, or diarrhoea. No bright red blood per  rectum, Genito Urinary  No frequency, urgency, dysuria,  Musculo Skeletal  No myalgia, arthralgia, joint swelling or pain  Neurologic  No weakness, numbness, change in gait,  Psychology  No depression, anxiety, insomnia.   Vitals:  Blood pressure 100/65, pulse (!) 107, temperature 98.8 F (37.1 C), temperature source Temporal, resp. rate 18, height 5' 5"  (1.651 m), weight 193 lb (87.5 kg), SpO2 96 %.  Physical Exam: WD in NAD Neck  Supple NROM, without any enlargements.  Lymph Node Survey No cervical supraclavicular or inguinal adenopathy Cardiovascular  Pulse normal rate, regularity and rhythm. S1 and S2 normal.  Lungs  Clear to auscultation bilateraly, without wheezes/crackles/rhonchi. Good air movement.  Skin  No rash/lesions/breakdown  Psychiatry  Alert and oriented to person, place, and time  Abdomen  Normoactive bowel sounds, abdomen soft, non-tender and obese without evidence of hernia.  Incisions well healed. Back No CVA tenderness Genito Urinary  deferred Rectal  deferred Extremities  No bilateral cyanosis, clubbing or edema.  Thereasa Solo, MD  07/01/2019, 2:49 PM

## 2019-07-01 ENCOUNTER — Encounter: Payer: Self-pay | Admitting: *Deleted

## 2019-07-01 ENCOUNTER — Encounter: Payer: Self-pay | Admitting: Gynecologic Oncology

## 2019-07-01 ENCOUNTER — Other Ambulatory Visit: Payer: Self-pay

## 2019-07-01 ENCOUNTER — Inpatient Hospital Stay: Payer: No Typology Code available for payment source | Attending: Gynecologic Oncology | Admitting: Gynecologic Oncology

## 2019-07-01 VITALS — BP 100/65 | HR 107 | Temp 98.8°F | Resp 18 | Ht 65.0 in | Wt 193.0 lb

## 2019-07-01 DIAGNOSIS — R102 Pelvic and perineal pain: Secondary | ICD-10-CM | POA: Insufficient documentation

## 2019-07-01 DIAGNOSIS — N736 Female pelvic peritoneal adhesions (postinfective): Secondary | ICD-10-CM | POA: Diagnosis not present

## 2019-07-01 DIAGNOSIS — N83201 Unspecified ovarian cyst, right side: Secondary | ICD-10-CM | POA: Diagnosis not present

## 2019-07-01 DIAGNOSIS — R140 Abdominal distension (gaseous): Secondary | ICD-10-CM | POA: Diagnosis not present

## 2019-07-01 DIAGNOSIS — N83202 Unspecified ovarian cyst, left side: Secondary | ICD-10-CM

## 2019-07-01 DIAGNOSIS — Z90721 Acquired absence of ovaries, unilateral: Secondary | ICD-10-CM | POA: Insufficient documentation

## 2019-07-01 NOTE — Patient Instructions (Signed)
The cyst in the right ovary was benign (non cancerous). The right ovary and tube was removed as was a cyst from the left ovary (you still have your left ovary).  Dr Denman George is clearing you to swim and bathe. You can return to heavy lifting with no restrictions next week.  Please follow-up with Dr Benjie Karvonen once a year for annual wellness checks.

## 2020-03-11 ENCOUNTER — Other Ambulatory Visit: Payer: Self-pay | Admitting: Family Medicine

## 2020-03-11 DIAGNOSIS — Z1231 Encounter for screening mammogram for malignant neoplasm of breast: Secondary | ICD-10-CM

## 2020-03-25 ENCOUNTER — Ambulatory Visit (INDEPENDENT_AMBULATORY_CARE_PROVIDER_SITE_OTHER): Payer: 59 | Admitting: Clinical

## 2020-03-25 ENCOUNTER — Other Ambulatory Visit: Payer: Self-pay

## 2020-03-25 DIAGNOSIS — Z87828 Personal history of other (healed) physical injury and trauma: Secondary | ICD-10-CM | POA: Diagnosis not present

## 2020-03-25 DIAGNOSIS — F419 Anxiety disorder, unspecified: Secondary | ICD-10-CM | POA: Diagnosis not present

## 2020-03-25 DIAGNOSIS — F331 Major depressive disorder, recurrent, moderate: Secondary | ICD-10-CM | POA: Diagnosis not present
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
## 2020-04-08 ENCOUNTER — Other Ambulatory Visit: Payer: Self-pay

## 2020-04-08 ENCOUNTER — Ambulatory Visit (INDEPENDENT_AMBULATORY_CARE_PROVIDER_SITE_OTHER): Payer: 59 | Admitting: Clinical

## 2020-04-08 DIAGNOSIS — F419 Anxiety disorder, unspecified: Secondary | ICD-10-CM

## 2020-04-08 DIAGNOSIS — Z87828 Personal history of other (healed) physical injury and trauma: Secondary | ICD-10-CM

## 2020-04-08 DIAGNOSIS — F331 Major depressive disorder, recurrent, moderate: Secondary | ICD-10-CM
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
## 2020-04-17 ENCOUNTER — Emergency Department (HOSPITAL_BASED_OUTPATIENT_CLINIC_OR_DEPARTMENT_OTHER)
Admission: EM | Admit: 2020-04-17 | Discharge: 2020-04-17 | Disposition: A | Discharge status: Home / Self Care | Payer: 59 | Attending: Emergency Medicine | Admitting: Emergency Medicine

## 2020-04-17 ENCOUNTER — Encounter (HOSPITAL_BASED_OUTPATIENT_CLINIC_OR_DEPARTMENT_OTHER): Payer: Self-pay | Admitting: Emergency Medicine

## 2020-04-17 ENCOUNTER — Other Ambulatory Visit: Payer: Self-pay

## 2020-04-17 DIAGNOSIS — Z7984 Long term (current) use of oral hypoglycemic drugs: Secondary | ICD-10-CM | POA: Insufficient documentation

## 2020-04-17 DIAGNOSIS — E119 Type 2 diabetes mellitus without complications: Secondary | ICD-10-CM | POA: Diagnosis not present

## 2020-04-17 DIAGNOSIS — Z794 Long term (current) use of insulin: Secondary | ICD-10-CM | POA: Diagnosis not present

## 2020-04-17 DIAGNOSIS — Z79899 Other long term (current) drug therapy: Secondary | ICD-10-CM | POA: Diagnosis not present

## 2020-04-17 DIAGNOSIS — J019 Acute sinusitis, unspecified: Secondary | ICD-10-CM | POA: Insufficient documentation

## 2020-04-17 DIAGNOSIS — J3489 Other specified disorders of nose and nasal sinuses: Secondary | ICD-10-CM

## 2020-04-17 DIAGNOSIS — R202 Paresthesia of skin: Secondary | ICD-10-CM | POA: Diagnosis not present

## 2020-04-17 DIAGNOSIS — R4701 Aphasia: Secondary | ICD-10-CM | POA: Diagnosis not present

## 2020-04-17 DIAGNOSIS — Z8616 Personal history of COVID-19: Secondary | ICD-10-CM | POA: Insufficient documentation

## 2020-04-17 DIAGNOSIS — I10 Essential (primary) hypertension: Secondary | ICD-10-CM | POA: Insufficient documentation

## 2020-04-17 DIAGNOSIS — F17200 Nicotine dependence, unspecified, uncomplicated: Secondary | ICD-10-CM | POA: Insufficient documentation

## 2020-04-17 MED ORDER — AMOXICILLIN-POT CLAVULANATE 875-125 MG PO TABS
1.0000 | ORAL_TABLET | Freq: Two times a day (BID) | ORAL | 0 refills | Status: DC
Start: 2020-04-17 — End: 2023-02-26

## 2020-04-17 NOTE — ED Triage Notes (Addendum)
Reports waking up with facial pain/numbness to right side, bilateral coldness to face and neck. Woke up at 0130 with symptoms. Went to bed 2100. States hx anxiety.

## 2020-04-17 NOTE — ED Provider Notes (Signed)
[facility] EMERGENCY DEPARTMENT Provider Note   CSN: [id number] Arrival date & time: 04/17/20  8338     History Chief Complaint  Patient presents with  . "face is cold"     Numbness to right side     Holly Wells is a 50 y.o. female.  50 yo F with a chief complaint of numbness to both sides of her face.  Is on the lower aspect of the jaw.  Been going on all day.  He thinks maybe it is worse when she goes out in the cold.  Is trying to warm it up with different wraps and turning up her temperature at home.  She had some difficulty speaking to her daughter earlier otherwise denies difficulty with speech or swallowing denies one-sided numbness or weakness.  She denies trauma denies headache denies neck pain.  She does have increased sinus pressure.  Has had increased sinus pressure since she had Covid a couple years ago.  She denies any intraoral pathology.  Denies any pain to her ears.  Denies recent new facial cream or Botox.  The history is provided by the patient.  Illness Severity:  Moderate Onset quality:  Gradual Duration:  1 day Timing:  Constant Progression:  Unchanged Chronicity:  New Associated symptoms: no chest pain, no congestion, no fever, no headaches, no myalgias, no nausea, no rhinorrhea, no shortness of breath, no vomiting and no wheezing        Past Medical History:  Diagnosis Date  . Anxiety   . Arthritis   . Asthma    ALLERGY INDUCED ASTHMA USES INHALER AT HS PRN  . Depression   . Diabetes mellitus    TYPE2  . 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 Family history of adverse reaction to anesthesia    FAMILY HX OF CLOTTING PROBLEMS AFTER SURGERY AND NEEDING BLOOD THINNERS  . Fibroid tumor   . GERD (gastroesophageal reflux disease)   . Headache   . Hypertension   . Ovarian cyst   . Seasonal allergies     Patient Active Problem List   Diagnosis Date Noted  . Peritoneal adhesions   . Right ovarian cyst  11/14/2018  . 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 Type 2 diabetes mellitus without complication, with long-term current use of insulin ([place]) 11/14/2018  . Obesity (BMI 30.0-34.9) 11/14/2018  . Carpal tunnel syndrome 11/05/2016  . Left ovarian cyst 12/19/2014  . LLQ pain 12/19/2014  . Labial abscess 12/19/2014  . Fallopian tube disorder   . MENORRHAGIA 12/22/2007  . HIDRADENITIS SUPPURATIVA 06/06/2007  . HYPERLIPIDEMIA 05/22/2007  . DIABETES MELLITUS II, UNCOMPLICATED 25/06/3974  . OBESITY, NOS 04/04/2006  . DEPRESSION, MAJOR, RECURRENT 04/04/2006  . PANIC ATTACKS 04/04/2006  . HYPERTENSION, BENIGN SYSTEMIC 04/04/2006  . GASTROESOPHAGEAL REFLUX, NO ESOPHAGITIS 04/04/2006    Past Surgical History:  Procedure Laterality Date  . APPENDECTOMY    . CHOLECYSTECTOMY     1991  . EXPLORATORY LAPAROTOMY W/ BOWEL RESECTION    . LYSIS OF ADHESION N/A 06/09/2019   Procedure: LYSIS OF ADHESION;  Surgeon: Everitt Amber, MD;  Location: [facility];  Service: Gynecology;  Laterality: N/A;  . ROBOTIC ASSISTED SALPINGO OOPHERECTOMY Right 06/09/2019   Procedure: XI ROBOTIC ASSISTED RIGHT SALPINGO OOPHORECTOMY/LEFT OVARIAN CYST;  Surgeon: Everitt Amber, MD;  Location: [facility];  Service: Gynecology;  Laterality: Right;  . Stomach and bowel surg    . TONSILLECTOMY       OB  History    Gravida  2   Para  2   Term  2   Preterm      AB      Living  2     SAB      IAB      Ectopic      Multiple      Live Births              Family History  Problem Relation Age of Onset  . Hypertension Mother   . Thyroid cancer Mother   . Diabetes Maternal Grandmother   . Hypertension Maternal Grandmother   . Breast cancer Maternal Grandmother 78  . Diabetes Maternal Grandfather   . Hypertension Maternal Grandfather   . Deep vein thrombosis Daughter     Social History   Tobacco Use  . Smoking status: Current Every Day Smoker  . Smokeless tobacco: Never Used  . Tobacco comment:  quit 2005, SOCIAL  ONLY   , started back in June 2020  Vaping Use  . Vaping Use: Never used  Substance Use Topics  . Alcohol use: Yes    Comment: social  . Drug use: No    Home Medications Prior to Admission medications   Medication Sig Start Date End Date Taking? Authorizing Provider  amoxicillin-clavulanate (AUGMENTIN) 875-125 MG tablet Take 1 tablet by mouth 2 (two) times daily. One po bid x 7 days 04/17/20  Yes Deno Etienne, DO  atorvastatin (LIPITOR) 10 MG tablet Take 10 mg by mouth daily. 05/06/19   [provider]  ELDERBERRY PO Take 1 tablet by mouth daily.    [provider]  erythromycin base (E-MYCIN) 500 MG tablet Take two tablets the day before surgery at 2pm, 3pm, 10pm 06/08/19   Cross, Melissa D, NP  FLOVENT HFA 110 MCG/ACT inhaler Inhale 2 puffs into the lungs at bedtime as needed (shortness of breath).  08/30/18   [provider]  hydrOXYzine (ATARAX/VISTARIL) 25 MG tablet Take 1 tablet (25 mg total) by mouth every 6 (six) hours. 10/02/18   Jacqlyn Larsen, PA-C  ibuprofen (ADVIL) 200 MG tablet Take 400 mg by mouth every 8 (eight) hours as needed for headache or moderate pain.     [provider]  ibuprofen (ADVIL) 800 MG tablet Take 1 tablet (800 mg total) by mouth every 8 (eight) hours as needed for moderate pain. For AFTER surgery 04/29/19   [name] D, NP  INVOKANA 300 MG TABS tablet Take 300 mg by mouth daily. 07/21/18   [provider]  Ketotifen Fumarate (ALLERGY EYE DROPS OP) Place 1 drop into both eyes daily as needed (allergies).    [provider]  liraglutide (VICTOZA) 18 MG/3ML SOPN Inject 0.6 mg into the skin daily.     [provider]  lisinopril-hydrochlorothiazide (PRINZIDE,ZESTORETIC) 20-25 MG per tablet Take 1 tablet by mouth daily.      [provider]  loratadine (CLARITIN) 10 MG tablet Take 10 mg by mouth daily.    [provider]  metFORMIN (GLUCOPHAGE) 1000 MG tablet Take 1,000 mg by mouth in  the morning and at bedtime.     [provider]  Multiple Vitamin (MULTIVITAMIN WITH MINERALS) TABS tablet Take 1 tablet by mouth daily.    [provider]  neomycin (MYCIFRADIN) 500 MG tablet Take two tablets the day before surgery at 2pm, 3pm, 10pm 06/08/19   Cross, Melissa D, NP  nitrofurantoin, macrocrystal-monohydrate, (MACROBID) 100 MG capsule Take 1 capsule (  100 mg total) by mouth 2 (two) times daily. 12/22/18   [name] D, NP  oxyCODONE (OXY IR/ROXICODONE) 5 MG immediate release tablet Take 1 tablet (5 mg total) by mouth every 4 (four) hours as needed for severe pain. For AFTER surgery, do not take and drive 05/19/22   Cross, Lenna Sciara D, NP  senna-docusate (SENOKOT-S) 8.6-50 MG tablet Take 2 tablets by mouth at bedtime. For AFTER surgery, do not take if having diarrhea 04/29/19   Cross, Lenna Sciara D, NP  traMADol (ULTRAM) 50 MG tablet 1-2 tabs every 6 hours prn moderate pain Patient taking differently: Take 50-100 mg by mouth every 6 (six) hours as needed for moderate pain or severe pain.  06/24/18   Princess Bruins, MD    Allergies    Bactrim [sulfamethoxazole-trimethoprim] and Other  Review of Systems   Review of Systems  Constitutional: Negative for chills and fever.  HENT: Negative for congestion and rhinorrhea.   Eyes: Negative for redness and visual disturbance.  Respiratory: Negative for shortness of breath and wheezing.   Cardiovascular: Negative for chest pain and palpitations.  Gastrointestinal: Negative for nausea and vomiting.  Genitourinary: Negative for dysuria and urgency.  Musculoskeletal: Negative for arthralgias and myalgias.  Skin: Negative for pallor and wound.  Neurological: Positive for numbness. Negative for dizziness and headaches.    Physical Exam Updated Vital Signs BP 119/86   Pulse 99   Temp 98 F (36.7 C)   Resp 16   Ht 5\' 5"  (1.651 m)   Wt 89.4 kg   LMP 04/03/2020 (Approximate)   SpO2 99%   BMI 32.78 kg/m   Physical  Exam Vitals and nursing note reviewed.  Constitutional:      General: She is not in acute distress.    Appearance: She is well-developed. She is not diaphoretic.  HENT:     Head: Normocephalic and atraumatic.     Comments: No erythema or rash.  TMs are normal bilaterally.  Discomfort with percussion to the maxillary sinus right greater than left.  Partial paresis to the forehead bilaterally Eyes:     Pupils: Pupils are equal, round, and reactive to light.  Cardiovascular:     Rate and Rhythm: Normal rate and regular rhythm.     Heart sounds: No murmur heard. No friction rub. No gallop.   Pulmonary:     Effort: Pulmonary effort is normal.     Breath sounds: No wheezing or rales.  Abdominal:     General: There is no distension.     Palpations: Abdomen is soft.     Tenderness: There is no abdominal tenderness.  Musculoskeletal:        General: No tenderness.     Cervical back: Normal range of motion and neck supple.  Skin:    General: Skin is warm and dry.  Neurological:     Mental Status: She is alert and oriented to person, place, and time.     GCS: GCS eye subscore is 4. GCS verbal subscore is 5. GCS motor subscore is 6.     Cranial Nerves: Cranial nerves are intact.     Sensory: Sensation is intact.     Motor: Motor function is intact.     Coordination: Coordination is intact.     Gait: Gait is intact.     Comments: Benign neurologic exam  Psychiatric:        Behavior: Behavior normal.     ED Results / Procedures / Treatments   Labs (all labs  ordered are listed, but only abnormal results are displayed) Labs Reviewed - No data to display  EKG None  Radiology No results found.  Procedures Procedures   Medications Ordered in ED Medications - No data to display  ED Course  I have reviewed the triage vital signs and the nursing notes.  Pertinent labs & imaging results that were available during my care of the patient were reviewed by me and considered in my  medical decision making (see chart for details).    MDM Rules/Calculators/A&P                          50 yo F with a chief complaints of numbness to her face.  Is on both sides she thinks right greater than left.  Corresponds with where she wears her face mask.  Could be a reaction to the fabric or a lotion or make-up that she applies.  The other possibility could be from Botox injections.  She is complaining mostly of sinus pressure as well.  Unlikely to be a stroke with bilateral symptoms.  Benign neuro exam.  We will start on antibiotics.  PCP follow-up.  4:31 AM:  I have discussed the diagnosis/risks/treatment options with the patient and believe the pt to be eligible for discharge home to follow-up with PCP. We also discussed returning to the ED immediately if new or worsening sx occur. We discussed the sx which are most concerning (e.g., sudden worsening pain, fever, inability to tolerate by mouth) that necessitate immediate return. Medications administered to the patient during their visit and any new prescriptions provided to the patient are listed below.  Medications given during this visit Medications - No data to display   The patient appears reasonably screen and/or stabilized for discharge and I doubt any other medical condition or other [facility] requiring further screening, evaluation, or treatment in the ED at this time prior to discharge.    Final Clinical Impression(s) / ED Diagnoses Final diagnoses:  Facial tingling sensation  Sinus pressure    Rx / DC Orders ED Discharge Orders         Ordered    amoxicillin-clavulanate (AUGMENTIN) 875-125 MG tablet  2 times daily        04/17/20 [place], [place], DO 04/17/20 [phone]

## 2020-04-17 NOTE — Discharge Instructions (Signed)
Follow up with your family doctor.  Please return for one-sided numbness or weakness difficulty with speech or swallowing.

## 2020-04-22 ENCOUNTER — Other Ambulatory Visit: Payer: Self-pay

## 2020-04-22 ENCOUNTER — Ambulatory Visit (INDEPENDENT_AMBULATORY_CARE_PROVIDER_SITE_OTHER): Payer: 59 | Admitting: Clinical

## 2020-04-22 DIAGNOSIS — F331 Major depressive disorder, recurrent, moderate: Secondary | ICD-10-CM

## 2020-04-22 DIAGNOSIS — F4310 Post-traumatic stress disorder, unspecified: Secondary | ICD-10-CM | POA: Diagnosis not present

## 2020-04-22 DIAGNOSIS — F419 Anxiety disorder, unspecified: Secondary | ICD-10-CM | POA: Diagnosis not present
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
## 2020-04-28 ENCOUNTER — Ambulatory Visit: Payer: No Typology Code available for payment source

## 2020-04-28 ENCOUNTER — Ambulatory Visit
Admission: RE | Admit: 2020-04-28 | Discharge: 2020-04-28 | Disposition: A | Discharge status: Home / Self Care | Payer: 59 | Source: Ambulatory Visit | Attending: Family Medicine | Admitting: Family Medicine

## 2020-04-28 ENCOUNTER — Other Ambulatory Visit: Payer: Self-pay

## 2020-04-28 DIAGNOSIS — Z1231 Encounter for screening mammogram for malignant neoplasm of breast: Secondary | ICD-10-CM

## 2020-05-06 ENCOUNTER — Other Ambulatory Visit: Payer: Self-pay

## 2020-05-06 ENCOUNTER — Ambulatory Visit (INDEPENDENT_AMBULATORY_CARE_PROVIDER_SITE_OTHER): Payer: 59 | Admitting: Clinical

## 2020-05-06 DIAGNOSIS — F331 Major depressive disorder, recurrent, moderate: Secondary | ICD-10-CM

## 2020-05-06 DIAGNOSIS — F4310 Post-traumatic stress disorder, unspecified: Secondary | ICD-10-CM

## 2020-05-06 DIAGNOSIS — F419 Anxiety disorder, unspecified: Secondary | ICD-10-CM | POA: Diagnosis not present
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
## 2020-05-20 ENCOUNTER — Ambulatory Visit (INDEPENDENT_AMBULATORY_CARE_PROVIDER_SITE_OTHER): Payer: 59 | Admitting: Clinical

## 2020-05-20 ENCOUNTER — Other Ambulatory Visit: Payer: Self-pay

## 2020-05-20 DIAGNOSIS — F331 Major depressive disorder, recurrent, moderate: Secondary | ICD-10-CM | POA: Diagnosis not present

## 2020-05-20 DIAGNOSIS — F4310 Post-traumatic stress disorder, unspecified: Secondary | ICD-10-CM | POA: Diagnosis not present

## 2020-05-20 DIAGNOSIS — F419 Anxiety disorder, unspecified: Secondary | ICD-10-CM

## 2020-05-20 NOTE — Progress Notes (Signed)
   THERAPIST PROGRESS NOTE  Session Time: 10am  Participation Level: Active  Behavioral Response: Neat,casual, pleasant  Type of Therapy: Individual  Treatment Goals addressed: Depression, trauma, anxiety Interventions:Supportive Virtual Visit via Video Note  I connected with Holly Wells on 05/20/20 at 10:00 AM EDT by a video enabled telemedicine application and verified that I am speaking with the correct person using two identifiers.  Location: Patient: Home Provider: Office   I discussed the limitations of evaluation and management by telemedicine and the availability of in person appointments. The patient expressed understanding and agreed to proceed.   I discussed the assessment and treatment plan with the patient. The patient was provided an opportunity to ask questions and all were answered. The patient agreed with the plan and demonstrated an understanding of the instructions.   The patient was advised to call back or seek an in-person evaluation if the symptoms worsen or if the condition fails to improve as anticipated.  I provided 55 minutes of non-face-to-face time during this encounter. Summary: Holly Wells is a 50 y.o. female who presents in a pleasant mood. Pt reports this week she has been experiencing night mares in the middle of the night that have contributed to her difficulty falling and staying asleep. When this occurs, pt says she prays/meditates, read the Bible, do arts and crafts or watch a comedy on TV. Pt says she has experienced increased anxiety this week but reports self care routine has helped with managing her anxiety. Pt continues to report difficulty trusting others, hypervigilance. Pt [place] crowd of people increase her anxiety so she stays for minimal time.  Suicidal/Homicidal: Pt denies SI/HI no plan or intent to harm self or others reported. Therapist Response: CSW assessed for changes in pt mood and behavior. CSW  provided pt with information on sleep hygiene tips to assist with improving sleep pattern. CSW reviewed with pt traits of healthy/unhealthy relationships and importance of establishing and maintaining healthy boundaries. CSW reviewed with pt grounding techniques.CSW prompted pt to discuss self care routine, progress/challenges to improving well being.  Plan: Return again in 2 weeks.  Diagnosis: Axis I: Major depressive disorder, recurrent, moderate Anxiety PTSD    Axis II:No diagnosis   Yvette Rack, LCSW 05/20/2020

## 2020-06-03 ENCOUNTER — Ambulatory Visit (INDEPENDENT_AMBULATORY_CARE_PROVIDER_SITE_OTHER): Payer: 59 | Admitting: Clinical

## 2020-06-03 ENCOUNTER — Other Ambulatory Visit: Payer: Self-pay

## 2020-06-03 DIAGNOSIS — F331 Major depressive disorder, recurrent, moderate: Secondary | ICD-10-CM | POA: Diagnosis not present

## 2020-06-03 DIAGNOSIS — F419 Anxiety disorder, unspecified: Secondary | ICD-10-CM

## 2020-06-03 DIAGNOSIS — F4310 Post-traumatic stress disorder, unspecified: Secondary | ICD-10-CM | POA: Diagnosis not present
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
## 2020-06-17 ENCOUNTER — Other Ambulatory Visit: Payer: Self-pay

## 2020-06-17 ENCOUNTER — Ambulatory Visit (INDEPENDENT_AMBULATORY_CARE_PROVIDER_SITE_OTHER): Payer: 59 | Admitting: Clinical

## 2020-06-17 DIAGNOSIS — F331 Major depressive disorder, recurrent, moderate: Secondary | ICD-10-CM

## 2020-06-17 DIAGNOSIS — F419 Anxiety disorder, unspecified: Secondary | ICD-10-CM

## 2020-06-17 DIAGNOSIS — F4310 Post-traumatic stress disorder, unspecified: Secondary | ICD-10-CM | POA: Diagnosis not present
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
## 2020-07-01 ENCOUNTER — Ambulatory Visit (INDEPENDENT_AMBULATORY_CARE_PROVIDER_SITE_OTHER): Payer: 59 | Admitting: Clinical

## 2020-07-01 ENCOUNTER — Other Ambulatory Visit: Payer: Self-pay

## 2020-07-01 DIAGNOSIS — F4310 Post-traumatic stress disorder, unspecified: Secondary | ICD-10-CM

## 2020-07-01 DIAGNOSIS — F419 Anxiety disorder, unspecified: Secondary | ICD-10-CM | POA: Diagnosis not present

## 2020-07-01 DIAGNOSIS — F331 Major depressive disorder, recurrent, moderate: Secondary | ICD-10-CM

## 2020-07-01 NOTE — Progress Notes (Signed)
   THERAPIST PROGRESS NOTE  Session Time: 10am  Participation Level: Active  Behavioral Response: Casual and NeatAlertEuthymic  Type of Therapy: Individual Therapy  Treatment Goals addressed: Coping  Interventions: Other: psychoeducation  Virtual Visit via Video Note  I connected with Holly Wells on 07/01/20 at 10:00 AM EDT by a video enabled telemedicine application and verified that I am speaking with the correct person using two identifiers.  Location: Patient: home Provider: office   I discussed the limitations of evaluation and management by telemedicine and the availability of in person appointments. The patient expressed understanding and agreed to proceed.   I discussed the assessment and treatment plan with the patient. The patient was provided an opportunity to ask questions and all were answered. The patient agreed with the plan and demonstrated an understanding of the instructions.   The patient was advised to call back or seek an in-person evaluation if the symptoms worsen or if the condition fails to improve as anticipated.  I provided 48 minutes of non-face-to-face time during this encounter.  Summary: Holly Wells is a 51 y.o. female who presents in euthymic mood, normal affect. Pt displayed improved mood today.  Pt reports she has had the opportunity to read a book in the park, work on crafting projects and be outside in nature. Pt states she plans to attend a family function this weekend and is giving herself permission to leave early or take a time out if she becomes overwhelmed in the environment. Pt reports she is working on healing and forgiveness. Pt making progress toward achieving treatment goal as evidenced by practicing daily self care, meditating/praying, and implementing grounding techniques to manage anxiety.  Suicidal/Homicidal:Pt denies SI/HI no plan or intent to harm self or others reported   Therapist Response: CSW verbally praised pt for the  progress she is making toward achieving treatment goals. CSW discussed with pt what wellness and provided information on 8 dimensions wellness. CSW actively listened as pt discussed her journey of forgiveness and steps she takes to heal.   Plan: Return again in 2 weeks.  Diagnosis: Axis I: Major depressive disorder, recurrent, moderate Anxiety PTSD    Axis II: No diagnosis    Yvette Rack, LCSW 07/01/2020

## 2020-07-14 ENCOUNTER — Other Ambulatory Visit: Payer: Self-pay

## 2020-07-14 ENCOUNTER — Ambulatory Visit (INDEPENDENT_AMBULATORY_CARE_PROVIDER_SITE_OTHER): Payer: 59 | Admitting: Clinical

## 2020-07-14 DIAGNOSIS — F419 Anxiety disorder, unspecified: Secondary | ICD-10-CM | POA: Diagnosis not present

## 2020-07-14 DIAGNOSIS — F4310 Post-traumatic stress disorder, unspecified: Secondary | ICD-10-CM | POA: Diagnosis not present

## 2020-07-14 DIAGNOSIS — F331 Major depressive disorder, recurrent, moderate: Secondary | ICD-10-CM
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
## 2020-07-28 ENCOUNTER — Other Ambulatory Visit: Payer: Self-pay

## 2020-07-28 ENCOUNTER — Ambulatory Visit (HOSPITAL_COMMUNITY): Payer: 59 | Admitting: Clinical

## 2020-07-28 DIAGNOSIS — F4310 Post-traumatic stress disorder, unspecified: Secondary | ICD-10-CM

## 2020-07-28 DIAGNOSIS — F419 Anxiety disorder, unspecified: Secondary | ICD-10-CM

## 2020-07-28 DIAGNOSIS — F331 Major depressive disorder, recurrent, moderate: Secondary | ICD-10-CM

## 2020-07-28 NOTE — Progress Notes (Signed)
   THERAPIST PROGRESS NOTE  Session Time: 10am  Participation Level: Active  Behavioral Response: NAAlertpleasant  Type of Therapy: Individual Therapy  Treatment Goals addressed: Coping  Interventions: DBT Virtual Visit via Telephone Note  I connected with Holly Wells on 07/28/20 at 10:00 AM EDT by telephone and verified that I am speaking with the correct person using two identifiers.  Location: Patient: home Provider: office   I discussed the limitations, risks, security and privacy concerns of performing an evaluation and management service by telephone and the availability of in person appointments. I also discussed with the patient that there may be a patient responsible charge related to this service. The patient expressed understanding and agreed to proceed.   I discussed the assessment and treatment plan with the patient. The patient was provided an opportunity to ask questions and all were answered. The patient agreed with the plan and demonstrated an understanding of the instructions.   The patient was advised to call back or seek an in-person evaluation if the symptoms worsen or if the condition fails to improve as anticipated.  I provided 47 minutes of non-face-to-face time during this encounter.   Summary: Holly Wells is a 50 y.o. female who reports she continues to have poor sleep pattern. Pt says she has vivid dreams that make it difficult to sleep. Pt says she continues to experience periods of sadness, low mood, anxiety. However, pt says she continues to practice interventions discussed in session and finds it beneficial managing stressors. Pt reports she is planning on going on a 1.5 week vacation. Pt she works a lot and has never taken this much time off of work but says she is looking forward to the opportunity to recharge. Pt identifies prayer/meditation, listening to jazz music and doing arts and crafts projects as a part of her self care routine.    Suicidal/Homicidal: Pt denies SI/HI no plan or intent to harm self or others reported.  Therapist Response: CSW verbally praised pt for the progress she is making toward achieving treatment goals. CSW highlighted areas of progress and encourages pt to pay attention to positive events in her life. CSW continues to discuss with pt emotion regulations skills and utilizing opposite action to change emotion. CSW reviewed and provided pt with information on mindfulness activity using 5 senses to assist with bringing thoughts into the present moment. Pt encouraged to practice intervention, to be further discussed next session.  Plan: Return again in 2 weeks.  Diagnosis: Axis I: Major depressive disorder, recurrent, moderate                                     Anxiety                                     PTSD    Axis II: No diagnosis    Yvette Rack, LCSW 07/28/2020

## 2020-08-11 ENCOUNTER — Ambulatory Visit (HOSPITAL_COMMUNITY): Payer: 59 | Admitting: Clinical

## 2020-08-25 ENCOUNTER — Ambulatory Visit (HOSPITAL_COMMUNITY): Payer: 59 | Admitting: Clinical

## 2020-08-26 ENCOUNTER — Ambulatory Visit (INDEPENDENT_AMBULATORY_CARE_PROVIDER_SITE_OTHER): Payer: 59 | Admitting: Clinical

## 2020-08-26 ENCOUNTER — Other Ambulatory Visit: Payer: Self-pay

## 2020-08-26 DIAGNOSIS — F419 Anxiety disorder, unspecified: Secondary | ICD-10-CM | POA: Diagnosis not present

## 2020-08-26 DIAGNOSIS — F331 Major depressive disorder, recurrent, moderate: Secondary | ICD-10-CM | POA: Diagnosis not present

## 2020-08-26 DIAGNOSIS — F4310 Post-traumatic stress disorder, unspecified: Secondary | ICD-10-CM
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
## 2020-09-09 ENCOUNTER — Ambulatory Visit (INDEPENDENT_AMBULATORY_CARE_PROVIDER_SITE_OTHER): Payer: 59 | Admitting: Clinical

## 2020-09-09 ENCOUNTER — Other Ambulatory Visit: Payer: Self-pay

## 2020-09-09 DIAGNOSIS — F331 Major depressive disorder, recurrent, moderate: Secondary | ICD-10-CM | POA: Diagnosis not present

## 2020-09-09 DIAGNOSIS — F4310 Post-traumatic stress disorder, unspecified: Secondary | ICD-10-CM

## 2020-09-09 DIAGNOSIS — F419 Anxiety disorder, unspecified: Secondary | ICD-10-CM | POA: Diagnosis not present

## 2020-09-09 NOTE — Progress Notes (Signed)
   THERAPIST PROGRESS NOTE  Session Time: 10am  Participation Level: Active  Behavioral Response: Casual and NeatAlertAnxious and overwhelmed  Type of Therapy: Individual Therapy  Treatment Goals addressed: Anxiety and Coping  Interventions: Strength-based Virtual Visit via Video Note  I connected with Holly Wells on 09/09/20 at 10:00 AM EDT by a video enabled telemedicine application and verified that I am speaking with the correct person using two identifiers.  Location: Patient: home Provider: office   I discussed the limitations of evaluation and management by telemedicine and the availability of in person appointments. The patient expressed understanding and agreed to proceed.   I discussed the assessment and treatment plan with the patient. The patient was provided an opportunity to ask questions and all were answered. The patient agreed with the plan and demonstrated an understanding of the instructions.   The patient was advised to call back or seek an in-person evaluation if the symptoms worsen or if the condition fails to improve as anticipated.  I provided 55 minutes of non-face-to-face time during this encounter.   Summary: Holly Wells is a 50 y.o. female who describes her mood as "tired and exhausted". Pt states she has been caring for her grandmother and managing her affairs. Pt states having a close relationship with her grandmother but having few supports to assist during this time. Pt states it has been challenging managing life stressors. Pt endorses heightened anxiety, poor appetite, poor sleep pattern, fatigue. Pt discussed having a stressful relationship with her mother and describes mother as being "jealous" of her accomplishments. Pt further discussed resentment toward mother for keeping her away from her bio father. Pt says she recently saw her father at a family gathering and reports last time she saw him was age 59. Pt reports she plans to stay in contact  with him.  Suicidal/Homicidal: Pt denies SI/HI no plan or intent to harm self or others reported  Therapist Response: CSW assessed for changes in mood and behavior. CSW provided pt with information on establishing healthy boundaries and processed importance of knowing limits in a relationship. CSW reviewed with pt healthy coping strategies and self care routine. CSW emphasized to pt importance of her being intentional with implementing self care routine even during challenging times.  Plan: Return again in 2 weeks.  Diagnosis: Axis I: Major depressive disorder, recurrent, moderate                                     Anxiety                                     PTSD    Axis II: No diagnosis    Yvette Rack, LCSW 09/09/2020

## 2020-09-23 ENCOUNTER — Ambulatory Visit (HOSPITAL_COMMUNITY): Payer: 59 | Admitting: Clinical

## 2020-09-23 ENCOUNTER — Other Ambulatory Visit: Payer: Self-pay

## 2020-09-25 IMAGING — CR DG ABDOMEN ACUTE W/ 1V CHEST
4 series · 4 of 4 positions shown · non-contrast
Comparison: Abdomen series December 04, 2011

CLINICAL DATA: Abdominal pain

EXAM:
DG ABDOMEN ACUTE W/ 1V CHEST

[w chest pa]
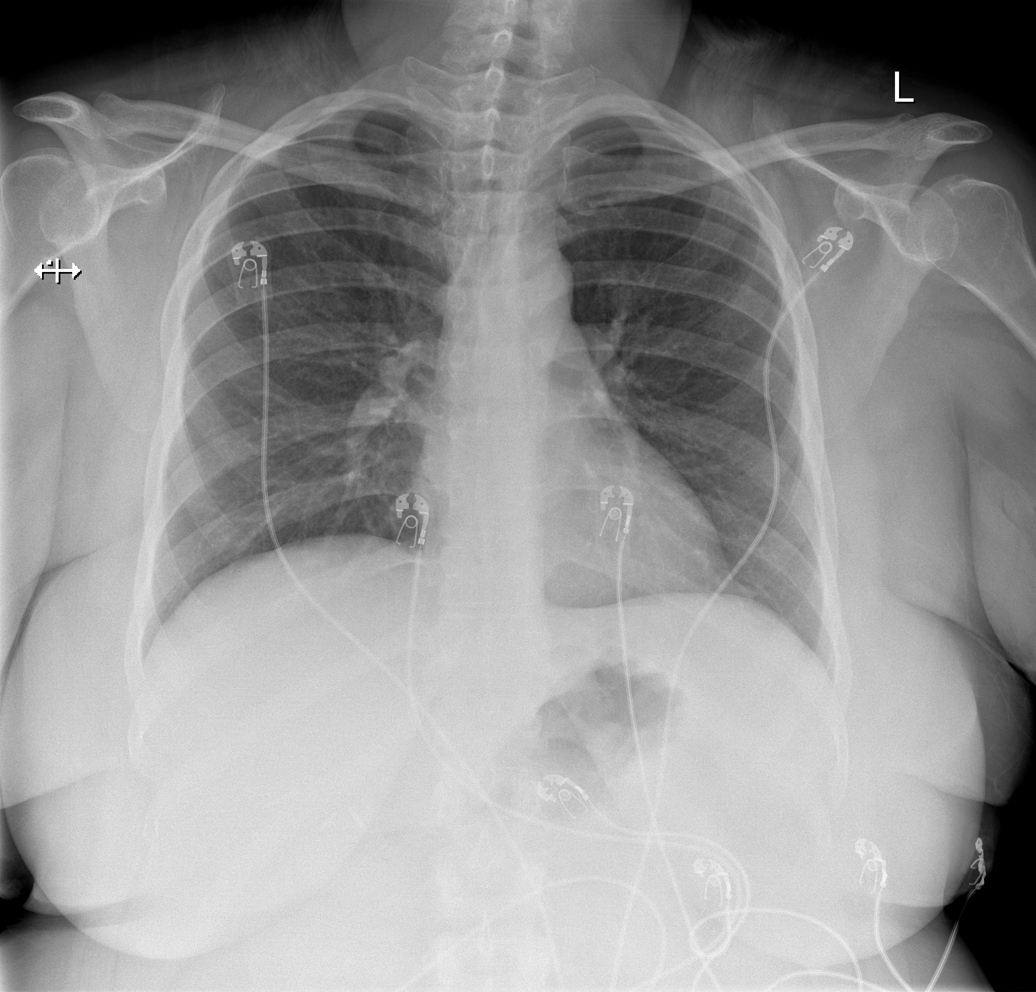

[w abdomen upright]
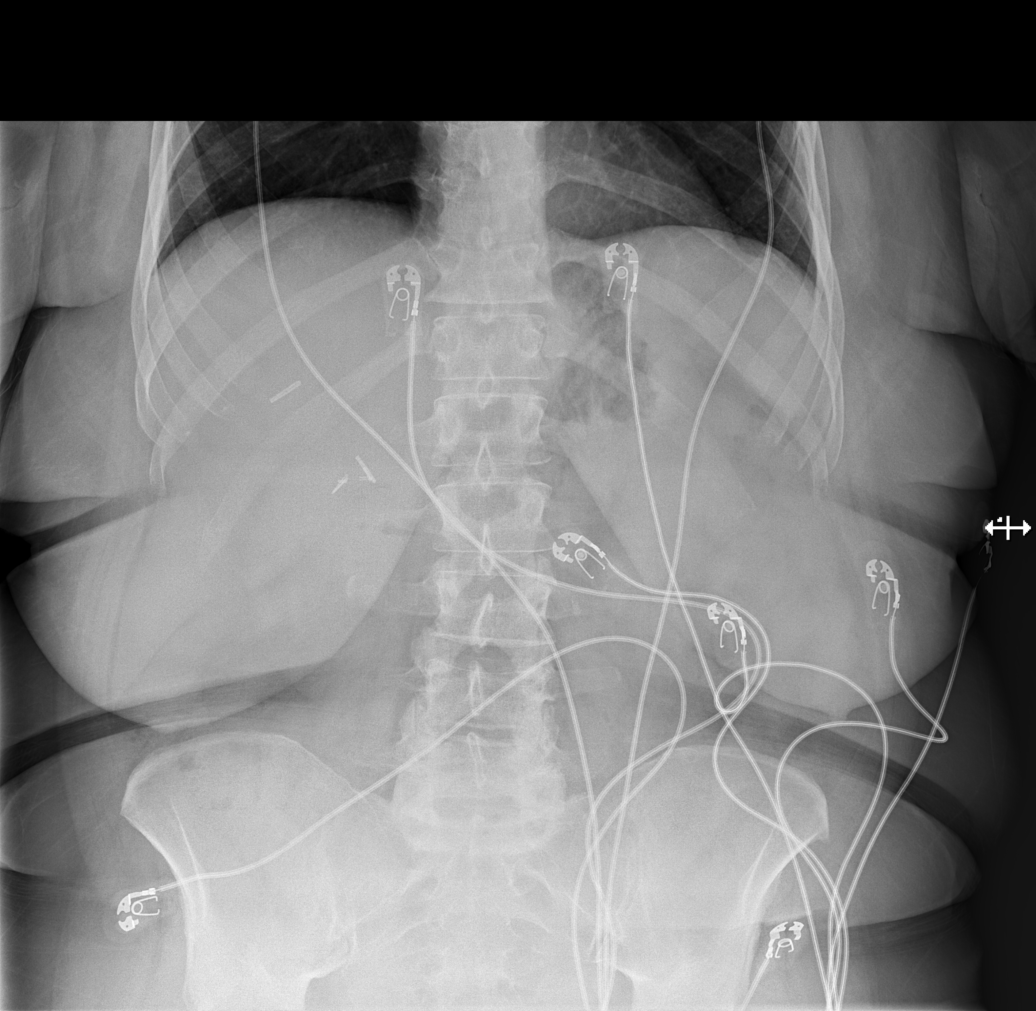

[t abdomen supine (1 of 2)]
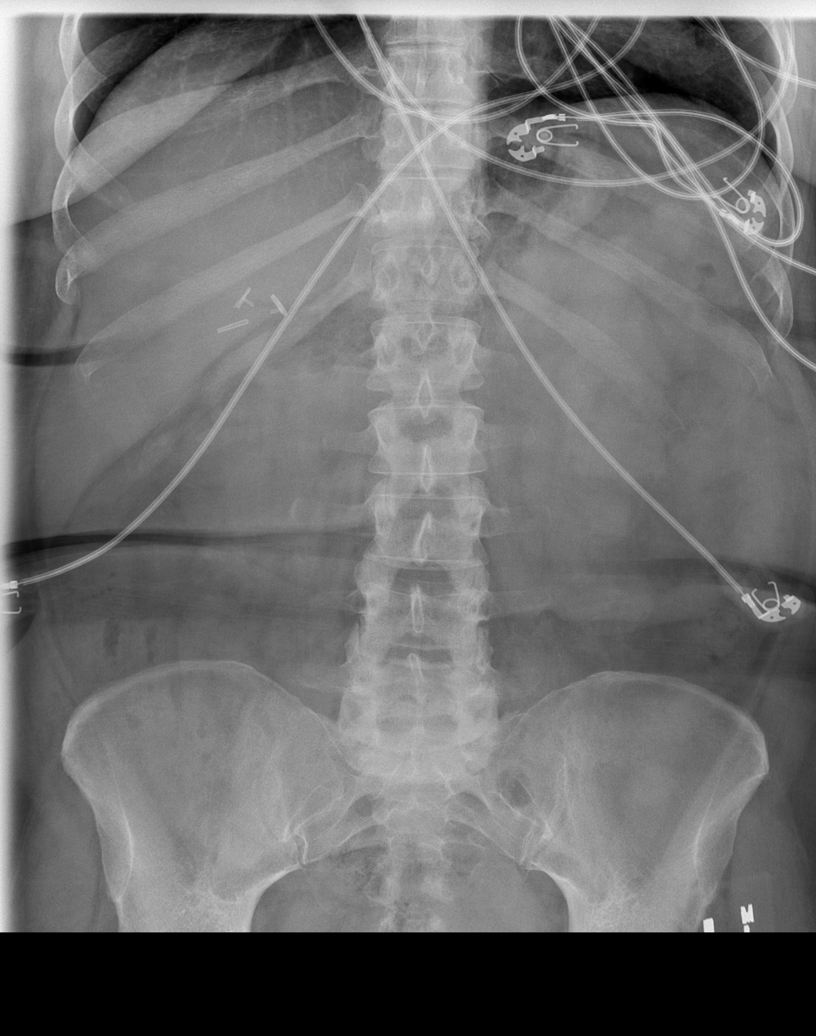

[t abdomen supine (2 of 2)]
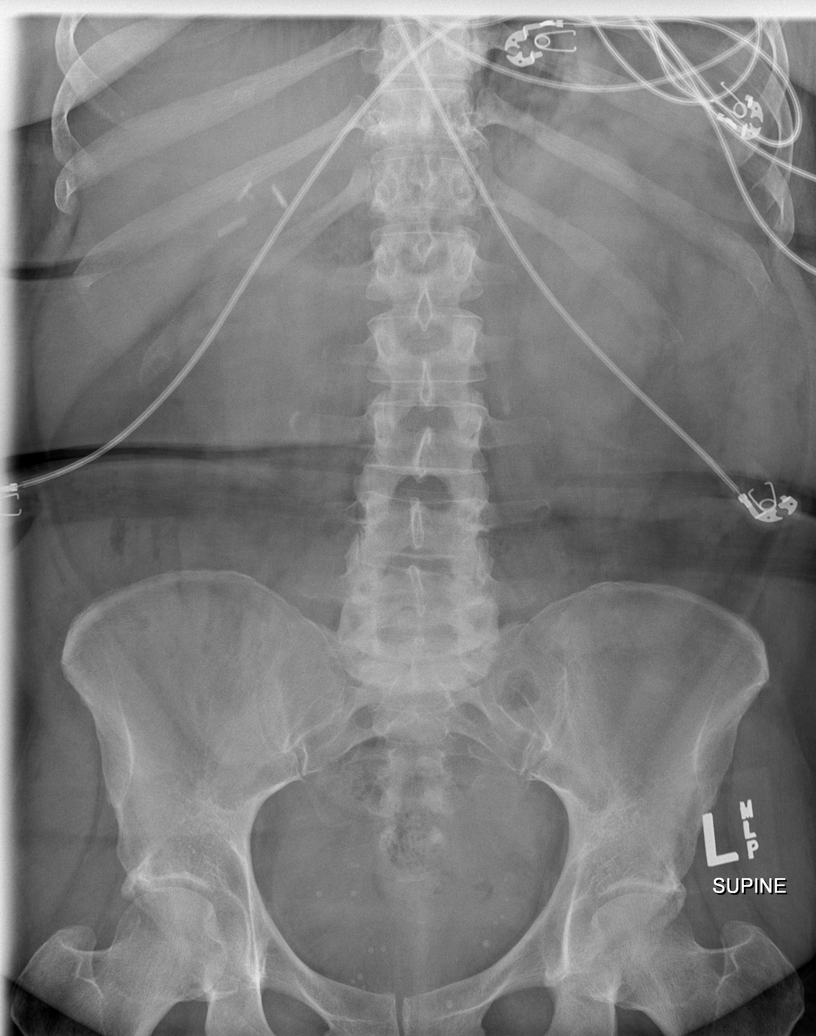

[4 of 4 positions shown; findings below may reference images not displayed]

FINDINGS: PA chest: Lungs are clear. Heart size and pulmonary vascularity are
normal. No adenopathy.

Supine and upright abdomen: There is a paucity of bowel gas with
most bowel loops fluid-filled. There is no bowel dilatation or
air-fluid level to suggest bowel obstruction. No free air. There are
surgical clips in the right upper quadrant. There are phleboliths in
the pelvis.
IMPRESSION: Paucity of bowel gas noted. While this finding may be seen normally,
it does suggest early ileus or enteritis. Bowel obstruction not felt
to be likely. No free air.

Lungs clear.

## 2020-09-30 ENCOUNTER — Other Ambulatory Visit: Payer: Self-pay

## 2020-09-30 ENCOUNTER — Ambulatory Visit (INDEPENDENT_AMBULATORY_CARE_PROVIDER_SITE_OTHER): Payer: 59 | Admitting: Clinical

## 2020-09-30 DIAGNOSIS — F419 Anxiety disorder, unspecified: Secondary | ICD-10-CM | POA: Diagnosis not present

## 2020-09-30 DIAGNOSIS — F4310 Post-traumatic stress disorder, unspecified: Secondary | ICD-10-CM | POA: Diagnosis not present

## 2020-09-30 DIAGNOSIS — F331 Major depressive disorder, recurrent, moderate: Secondary | ICD-10-CM | POA: Diagnosis not present

## 2020-09-30 NOTE — Progress Notes (Signed)
   THERAPIST PROGRESS NOTE  Session Time: 9am  Participation Level: Active  Behavioral Response: Casual and NeatAlertpleasant, tearful at times  Type of Therapy: Individual Therapy  Treatment Goals addressed: Anxiety and Coping  Interventions: DBT Virtual Visit via Video Note  I connected with Holly Wells on 09/30/20 at  9:00 AM EDT by a video enabled telemedicine application and verified that I am speaking with the correct person using two identifiers.  Location: Patient: home Provider: office   I discussed the limitations of evaluation and management by telemedicine and the availability of in person appointments. The patient expressed understanding and agreed to proceed.   I discussed the assessment and treatment plan with the patient. The patient was provided an opportunity to ask questions and all were answered. The patient agreed with the plan and demonstrated an understanding of the instructions.   The patient was advised to call back or seek an in-person evaluation if the symptoms worsen or if the condition fails to improve as anticipated.  I provided 54 minutes of non-face-to-face time during this encounter.  Summary: Holly Wells is a 50 y.o. female who presents in a pleasant mood became tearful discussing life stressors she is dealing with. Pt reports she is providing caretaking responsibilities for her elderly grandmother. Pt says she has had minimal assistance from other family members to help with her grandmothers care. Pt endorses heightened anxiety, poor sleep pattern and some isolation from others. Pt states when she becomes overwhelmed she will retreat and isolate herself. Per pt report, she is working on establishing boundaries with family members and identified times where she has said no and did not waver on her decision. Pt says she has been spending time at aunt and uncle house and they have created a "she cave" for her where she has been able to be alone,  complete arts and crafts projects, pray/meditate.  Suicidal/Homicidal: Pt denies SI/HI no plan, intent or attempts to harm self or others reported.  Therapist Response: CSW reminded pt of some of the strengths she possess and encourages her to use strengths to improve mood. CSW reviewed and provided pt with information on leaves on a stream mindfulness exercise to assist her with managing unwanted thoughts and emotions. CSW processed with pt ways to create and implement personal boundaries.   Plan: Return again in 1 weeks.  Diagnosis: Axis I: Major depressive disorder, recurrent, moderate                                     Anxiety                                     PTSD    Axis II: No diagnosis    Yvette Rack, LCSW 09/30/2020

## 2020-10-07 ENCOUNTER — Ambulatory Visit (HOSPITAL_COMMUNITY): Payer: 59 | Admitting: Clinical

## 2020-10-21 ENCOUNTER — Other Ambulatory Visit: Payer: Self-pay

## 2020-10-21 ENCOUNTER — Ambulatory Visit (INDEPENDENT_AMBULATORY_CARE_PROVIDER_SITE_OTHER): Payer: 59 | Admitting: Clinical

## 2020-10-21 DIAGNOSIS — F4310 Post-traumatic stress disorder, unspecified: Secondary | ICD-10-CM

## 2020-10-21 DIAGNOSIS — F331 Major depressive disorder, recurrent, moderate: Secondary | ICD-10-CM

## 2020-10-21 DIAGNOSIS — F419 Anxiety disorder, unspecified: Secondary | ICD-10-CM | POA: Diagnosis not present

## 2020-10-21 NOTE — Progress Notes (Signed)
   THERAPIST PROGRESS NOTE  Session Time: 10am  Participation Level: Active  Behavioral Response: Casual and NeatAlerttearful at times  Type of Therapy: Individual Therapy  Treatment Goals addressed: Anxiety and Coping  Interventions: CBT Virtual Visit via Video Note  I connected with Holly Suttonon 10/21/20 at 10:00 AM EDT by a video enabled telemedicine application and verified that I am speaking with the correct person using two identifiers.  Location: Patient: home Provider: office   I discussed the limitations of evaluation and management by telemedicine and the availability of in person appointments. The patient expressed understanding and agreed to proceed.   I discussed the assessment and treatment plan with the patient. The patient was provided an opportunity to ask questions and all were answered. The patient agreed with the plan and demonstrated an understanding of the instructions.   The patient was advised to call back or seek an in-person evaluation if the symptoms worsen or if the condition fails to improve as anticipated.  I provided 54 minutes of non-face-to-face time during this encounter.  Summary: Pt displayed periods of tearfulness as she discussed receiving news that her youngest daughter is experiencing SI. Pt says she has been assisting her daughter with addressing some of her challenges and acknowledges the impact it has had on her emotional and mental health. Pt states she prefers to be alone when depressive and anxiety sxs increase but reports she enjoyed spending time with a friend. Pt says her friend did a surprise visit and she was able to resume some of her usual social interactions.  Suicidal/Homicidal: Pt denies SI/HI no plan, intent or attempts to harm self or others reported.  Therapist Response: CSW verbally praised pt for successful attempts at replacing distorted negative thinking with reality based messages. CSW discussed with pt normalizing  sadness and variations in mood that are within normal sphere. CSW reviewed with pt behavioral coping strategies to reduce depressive sxs.  Plan: Return again in 2 weeks.  Diagnosis: Axis I: Major depressive disorder, recurrent, moderate                                     Anxiety                                     PTSD    Axis II: No diagnosis    Yvette Rack, LCSW 10/21/2020

## 2020-11-04 ENCOUNTER — Ambulatory Visit (HOSPITAL_COMMUNITY): Payer: 59 | Admitting: Clinical

## 2020-11-11 ENCOUNTER — Other Ambulatory Visit: Payer: Self-pay

## 2020-11-11 ENCOUNTER — Ambulatory Visit (INDEPENDENT_AMBULATORY_CARE_PROVIDER_SITE_OTHER): Payer: 59 | Admitting: Clinical

## 2020-11-11 DIAGNOSIS — F4310 Post-traumatic stress disorder, unspecified: Secondary | ICD-10-CM | POA: Diagnosis not present

## 2020-11-11 DIAGNOSIS — F419 Anxiety disorder, unspecified: Secondary | ICD-10-CM

## 2020-11-11 DIAGNOSIS — F331 Major depressive disorder, recurrent, moderate: Secondary | ICD-10-CM | POA: Diagnosis not present
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
## 2020-11-18 ENCOUNTER — Ambulatory Visit (HOSPITAL_COMMUNITY): Payer: 59 | Admitting: Clinical

## 2020-12-02 ENCOUNTER — Ambulatory Visit (HOSPITAL_COMMUNITY): Payer: 59 | Admitting: Clinical

## 2020-12-23 ENCOUNTER — Ambulatory Visit (HOSPITAL_COMMUNITY): Payer: 59 | Admitting: Clinical

## 2021-01-06 ENCOUNTER — Ambulatory Visit (HOSPITAL_COMMUNITY): Payer: 59 | Admitting: Clinical

## 2021-01-13 ENCOUNTER — Other Ambulatory Visit: Payer: Self-pay

## 2021-01-13 ENCOUNTER — Ambulatory Visit (INDEPENDENT_AMBULATORY_CARE_PROVIDER_SITE_OTHER): Payer: 59 | Admitting: Clinical

## 2021-01-13 DIAGNOSIS — F419 Anxiety disorder, unspecified: Secondary | ICD-10-CM

## 2021-01-13 DIAGNOSIS — F331 Major depressive disorder, recurrent, moderate: Secondary | ICD-10-CM

## 2021-01-13 DIAGNOSIS — F4310 Post-traumatic stress disorder, unspecified: Secondary | ICD-10-CM

## 2021-01-13 NOTE — Progress Notes (Signed)
   THERAPIST PROGRESS NOTE  Session Time: 9am  Participation Level: Active  Behavioral Response: CasualAlert"numb"  Type of Therapy: Individual Therapy  Treatment Goals addressed: Coping  Interventions: Supportive Virtual Visit via Video Note  I connected with Holly Wells on 01/13/21 at  9:00 AM EST by a video enabled telemedicine application and verified that I am speaking with the correct person using two identifiers.  Location: Patient: home Provider: office   I discussed the limitations of evaluation and management by telemedicine and the availability of in person appointments. The patient expressed understanding and agreed to proceed.   I discussed the assessment and treatment plan with the patient. The patient was provided an opportunity to ask questions and all were answered. The patient agreed with the plan and demonstrated an understanding of the instructions.   The patient was advised to call back or seek an in-person evaluation if the symptoms worsen or if the condition fails to improve as anticipated.  I provided 50 minutes of non-face-to-face time during this encounter.  Summary: Pt presents in a pleasant mood but describes her overall mood as "numb" Pt states "Ive cut people off" and says she mainly communicates with her children. Pt states the people she has distanced herself from are people who she feels drains her. Pt appeared to be proud as she discussed prioritizing her needs. Pt states she has picked up a part time job and work is manageable. Pt says she is looking forward to "me time" this weekend where she says she intends to work on [employer] projects.  Suicidal/Homicidal: Pt denies SI/HI no plan, intent or attempts to harm self or others reported  Therapist Response: Todays session consisted of assisting pt with identifying ways she can continue to enforce personal boundaries. Discussed with pt grounding techniques and deep breathing exercises to manage  anxiety. Verbally praised pt for the progress she continues to make to improve her overall well being.  Plan: Return again in 1 weeks.  Diagnosis: Axis I: Major depressive disorder, recurrent, moderate                                     Anxiety                                     PTSD    Axis II: No diagnosis    Yvette Rack, LCSW 01/13/2021

## 2021-01-20 ENCOUNTER — Other Ambulatory Visit: Payer: Self-pay

## 2021-01-20 ENCOUNTER — Ambulatory Visit (INDEPENDENT_AMBULATORY_CARE_PROVIDER_SITE_OTHER): Payer: 59 | Admitting: Clinical

## 2021-01-20 DIAGNOSIS — F4310 Post-traumatic stress disorder, unspecified: Secondary | ICD-10-CM | POA: Diagnosis not present

## 2021-01-20 DIAGNOSIS — F419 Anxiety disorder, unspecified: Secondary | ICD-10-CM

## 2021-01-20 DIAGNOSIS — F331 Major depressive disorder, recurrent, moderate: Secondary | ICD-10-CM | POA: Diagnosis not present

## 2021-01-20 NOTE — Progress Notes (Signed)
° °  THERAPIST PROGRESS NOTE  Session Time: 10am  Participation Level: Active  Behavioral Response: CasualAlertAnxious and Depressed  Type of Therapy: Individual Therapy  Treatment Goals addressed: Coping  Interventions: Supportive Virtual Visit via Video Note  I connected with Holly Suttonon 01/20/21 at 10:00 AM EST by a video enabled telemedicine application and verified that I am speaking with the correct person using two identifiers.  Location: Patient: home Provider: office   I discussed the limitations of evaluation and management by telemedicine and the availability of in person appointments. The patient expressed understanding and agreed to proceed.   I discussed the assessment and treatment plan with the patient. The patient was provided an opportunity to ask questions and all were answered. The patient agreed with the plan and demonstrated an understanding of the instructions.   The patient was advised to call back or seek an in-person evaluation if the symptoms worsen or if the condition fails to improve as anticipated.  I provided 60 minutes of non-face-to-face time during this encounter.  Summary: Pt presents in a tearful, sad and anxious mood. Pt endorses panic attacks mostly at night, poor sleep pattern(avg 3-4 hrs rest) racing thoughts, hypervigilance, nightmares, constant worry about youngest daughter. Pt states poor sleep pattern has worsened since November and says she has difficulty staying asleep. According to pt, her daughter has encouraged her to try melatonin as a sleep aid. Pt says she is agreeable to do so. Additionally pt reports social isolation and says she leaves the home to attend medical appointments.  Suicidal/Homicidal: Pt denies SI/HI no plan, intent or attempts to harm self or others reported  Therapist Response: Assessed for changes in mood, behavior and daily functioning. Discussed with pt stress response vs relaxation response. Processed with pt  sxs of depression/trauma/anxiety and coping strategies to manage unwanted emotions. Reviewed with pt deep breathing exercises and grounding techniques to manage anxiety inducing situations.   Plan: Return again in 3 weeks.  Diagnosis: Axis I: Major depressive disorder, recurrent, moderate                                     Anxiety                                     PTSD    Axis II: No diagnosis    Yvette Rack, LCSW 01/20/2021

## 2021-02-03 ENCOUNTER — Ambulatory Visit (HOSPITAL_COMMUNITY): Payer: 59 | Admitting: Clinical

## 2021-02-10 ENCOUNTER — Other Ambulatory Visit: Payer: Self-pay

## 2021-02-10 ENCOUNTER — Ambulatory Visit (INDEPENDENT_AMBULATORY_CARE_PROVIDER_SITE_OTHER): Payer: 59 | Admitting: Clinical

## 2021-02-10 DIAGNOSIS — F419 Anxiety disorder, unspecified: Secondary | ICD-10-CM | POA: Diagnosis not present

## 2021-02-10 DIAGNOSIS — F331 Major depressive disorder, recurrent, moderate: Secondary | ICD-10-CM | POA: Diagnosis not present

## 2021-02-10 DIAGNOSIS — F4310 Post-traumatic stress disorder, unspecified: Secondary | ICD-10-CM

## 2021-02-10 NOTE — Progress Notes (Signed)
° °  THERAPIST PROGRESS NOTE  Session Time: 10am  Participation Level: Active  Behavioral Response: CasualAlertpleasant  Type of Therapy: Individual Therapy  Treatment Goals addressed: Coping  Interventions: Supportive Virtual Visit via Video Note  I connected with [name] on 02/10/21 at 10:00 AM EST by a video enabled telemedicine application and verified that I am speaking with the correct person using two identifiers.  Location: Patient: home Provider: office   I discussed the limitations of evaluation and management by telemedicine and the availability of in person appointments. The patient expressed understanding and agreed to proceed.   I discussed the assessment and treatment plan with the patient. The patient was provided an opportunity to ask questions and all were answered. The patient agreed with the plan and demonstrated an understanding of the instructions.   The patient was advised to call back or seek an in-person evaluation if the symptoms worsen or if the condition fails to improve as anticipated.  I provided 54 minutes of non-face-to-face time during this encounter.  Summary: Pt reports she is taking melatoning gummies as a sleep aid and reports improvement in sleep pattern. Pt discussed having tendency to isolate self from others during periods of sadness. Pt says isolation allows her time to manage unwanted emotions without input from others. Pt states coping method such as taking hot bath, listening to jazz music and crafting assist with managing depression and anxiety sxs. Suicidal/Homicidal:  Pt denies SI/HI no plan, intent or attempts to harm self or others reported Therapist Response: Reviewed and provided information on common trauma reactions and assisted pt in identifying healthy coping methods to manage anxiety inducing situations. Assessed for safety concerns, none reported.  Plan: Return again in 2 weeks.  Diagnosis: Axis I:  Major depressive  disorder, recurrent, moderate                                     Anxiety                                     PTSD    Axis II: No diagnosis    Yvette Rack, LCSW 02/10/2021

## 2021-02-24 ENCOUNTER — Ambulatory Visit (INDEPENDENT_AMBULATORY_CARE_PROVIDER_SITE_OTHER): Payer: 59 | Admitting: Clinical

## 2021-02-24 ENCOUNTER — Other Ambulatory Visit: Payer: Self-pay

## 2021-02-24 DIAGNOSIS — F4310 Post-traumatic stress disorder, unspecified: Secondary | ICD-10-CM

## 2021-02-24 DIAGNOSIS — F331 Major depressive disorder, recurrent, moderate: Secondary | ICD-10-CM | POA: Diagnosis not present

## 2021-02-24 DIAGNOSIS — F419 Anxiety disorder, unspecified: Secondary | ICD-10-CM
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
## 2021-03-10 ENCOUNTER — Other Ambulatory Visit: Payer: Self-pay

## 2021-03-10 ENCOUNTER — Ambulatory Visit (HOSPITAL_COMMUNITY): Payer: 59 | Admitting: Clinical

## 2021-03-24 ENCOUNTER — Other Ambulatory Visit: Payer: Self-pay

## 2021-03-24 ENCOUNTER — Ambulatory Visit (INDEPENDENT_AMBULATORY_CARE_PROVIDER_SITE_OTHER): Payer: 59 | Admitting: Clinical

## 2021-03-24 DIAGNOSIS — F331 Major depressive disorder, recurrent, moderate: Secondary | ICD-10-CM

## 2021-03-24 DIAGNOSIS — F4310 Post-traumatic stress disorder, unspecified: Secondary | ICD-10-CM

## 2021-03-24 DIAGNOSIS — F419 Anxiety disorder, unspecified: Secondary | ICD-10-CM

## 2021-03-24 NOTE — Progress Notes (Signed)
° °  THERAPIST PROGRESS NOTE  Session Time: 10am  Participation Level: Active  Behavioral Response: Casual and NeatAlert  Type of Therapy: Individual Therapy  Treatment Goals addressed:   ProgressTowards Goals: Progressing  Interventions: Supportive  Summary: Pt states she has been experiencing an increase in stress at home and work. Pt endorses irritability, agitation, fatigue and feeling overwhelmed. Pt is working multiple jobs and working to create healthy work life balance. Nevertheless pt states she has found reframing negative thinking with positive cognitions has allowed her to change how she feels about situations. Additionally, pt reports she is prioritizing implementing healthy boundaries in her relationships. Pt says she has a tendency of overcommiting and overextending herself.  Suicidal/Homicidal: Pt denies SI/HI no plan, intent or attempts to harm self or others reported  Therapist Response: Utilized strength exploration to highlight small gains that deserve to be celebrated. Actively listened as pt discussed people she gives energy to and who take energy from her. Assisted pt in brainstorming ways to protect her energy/peace by maintaining healthy boundaries. Discussed traits of healthy boundaries and relationships.  Plan: Return again in 2 weeks.  Diagnosis: Major depressive disorder, recurrent, moderate                                     Anxiety                                     PTSD  Collaboration of Care: Other None requested  Patient/Guardian was advised Release of Information must be obtained prior to any record release in order to collaborate their care with an outside provider. Patient/Guardian was advised if they have not already done so to contact the registration department to sign all necessary forms in order for [place] to release information regarding their care.   Consent: Patient/Guardian gives verbal consent for treatment and assignment of benefits for  services provided during this visit. Patient/Guardian expressed understanding and agreed to proceed.   Yvette Rack, LCSW 03/24/2021

## 2021-04-28 ENCOUNTER — Telehealth (HOSPITAL_COMMUNITY): Payer: 59 | Admitting: Clinical

## 2021-04-28 ENCOUNTER — Other Ambulatory Visit: Payer: Self-pay

## 2021-05-12 ENCOUNTER — Telehealth (HOSPITAL_COMMUNITY): Payer: Self-pay | Admitting: Clinical

## 2021-05-12 ENCOUNTER — Telehealth (HOSPITAL_COMMUNITY): Payer: 59 | Admitting: Clinical

## 2021-05-26 ENCOUNTER — Telehealth (HOSPITAL_COMMUNITY): Payer: 59 | Admitting: Clinical

## 2021-05-26 IMAGING — US US PELVIS COMPLETE WITH TRANSVAGINAL
1 series · 13 of 25 positions shown · non-contrast
Comparison: 07/09/2018

CLINICAL DATA: Bilateral ovarian cysts

EXAM:
TRANSABDOMINAL AND TRANSVAGINAL ULTRASOUND OF PELVIS
TECHNIQUE: Both transabdominal and transvaginal ultrasound examinations of the
pelvis were performed. Transabdominal technique was performed for
global imaging of the pelvis including uterus, ovaries, adnexal
regions, and pelvic cul-de-sac. It was necessary to proceed with
endovaginal exam following the transabdominal exam to visualize the
ovaries and adnexa.

[Series 1: us pelvis complete with transvaginal · 13 of 63 slices shown]
[im 1/63]
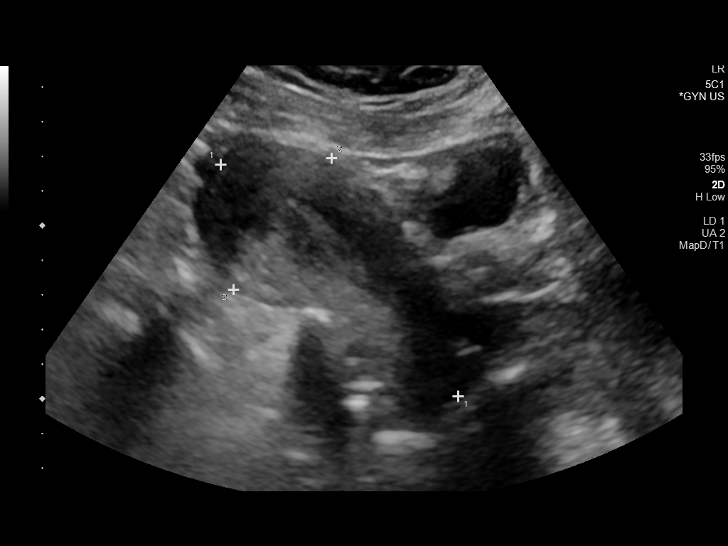
[im 6/63]
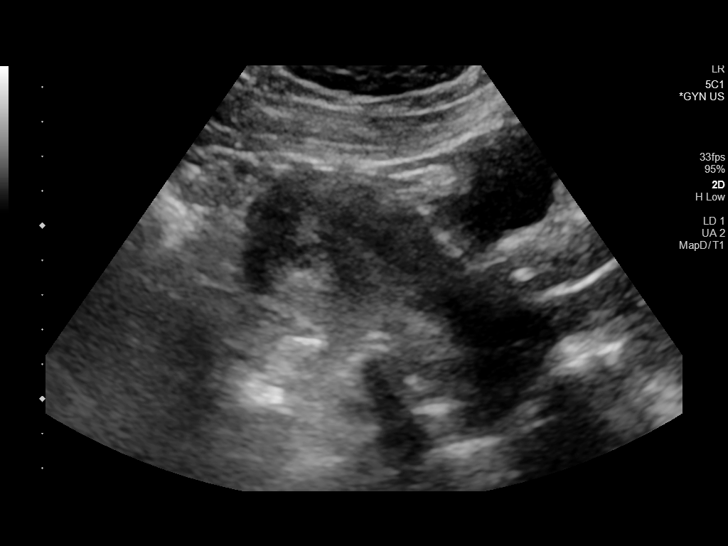
[im 11/63]
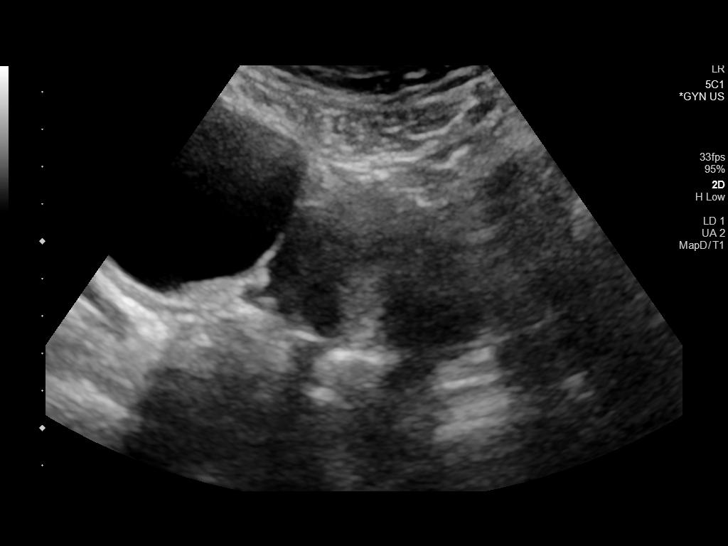
[im 16/63]
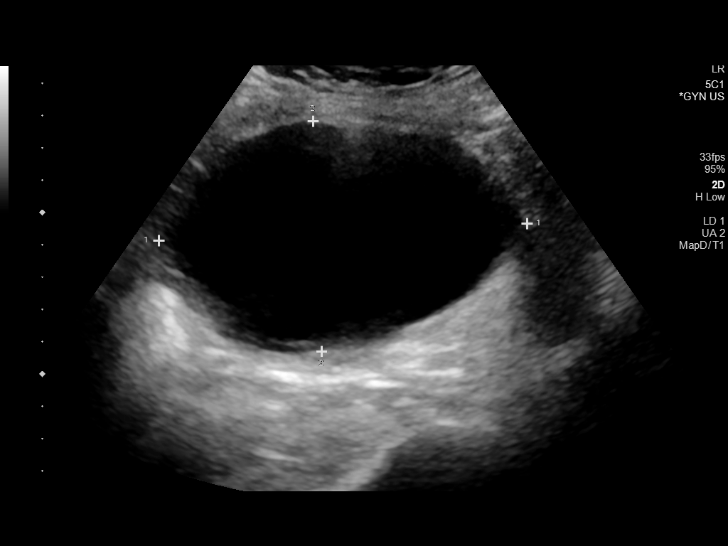
[im 21/63]
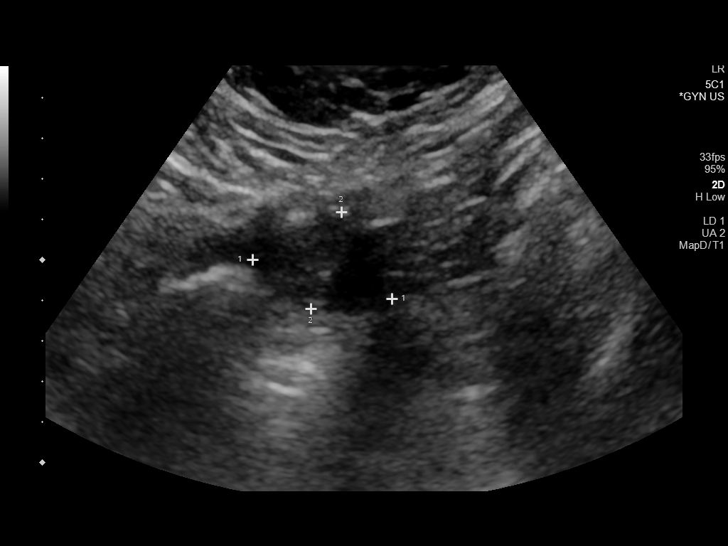
[im 26/63]
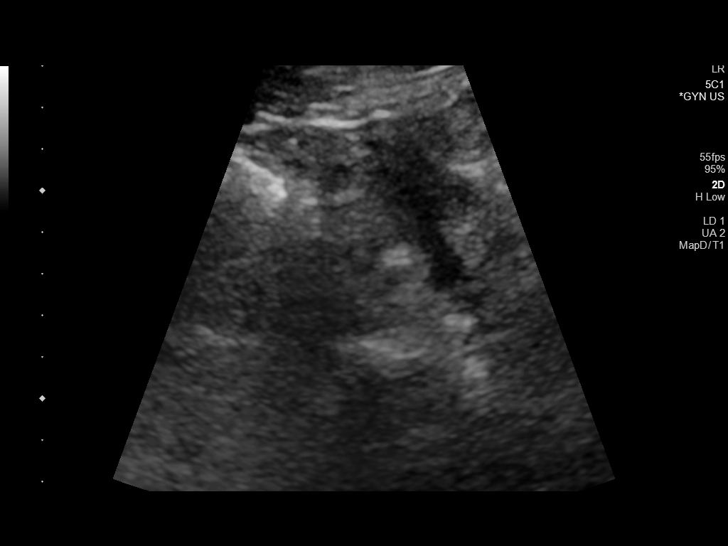
[im 32/63]
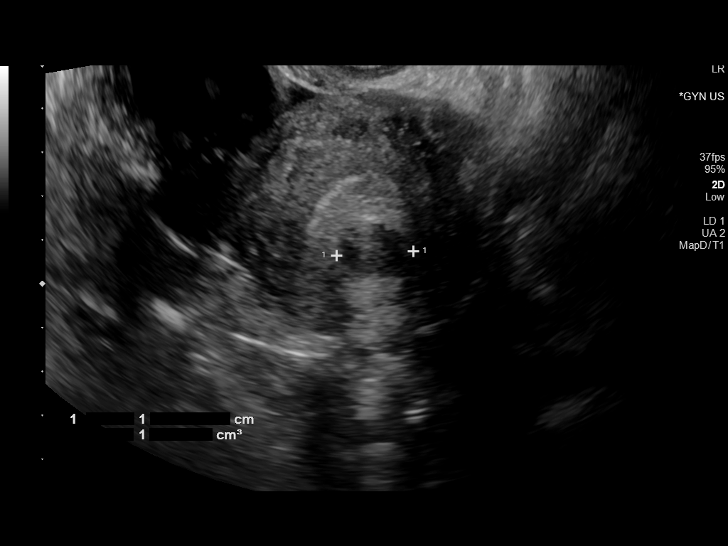
[im 37/63]
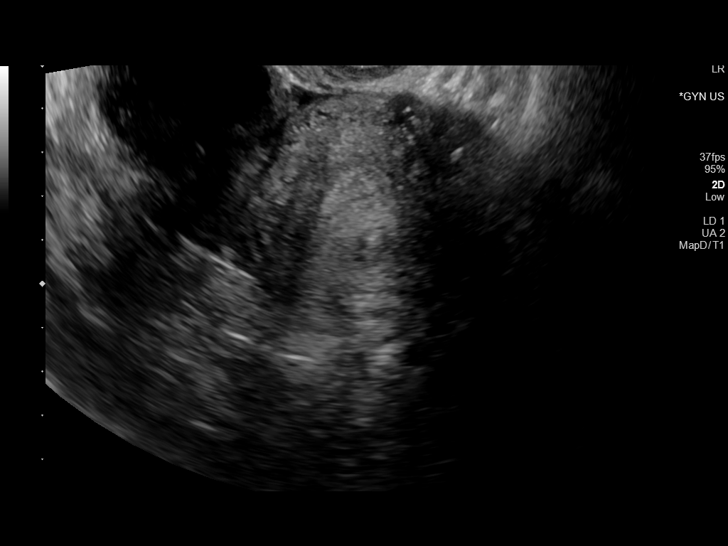
[im 42/63]
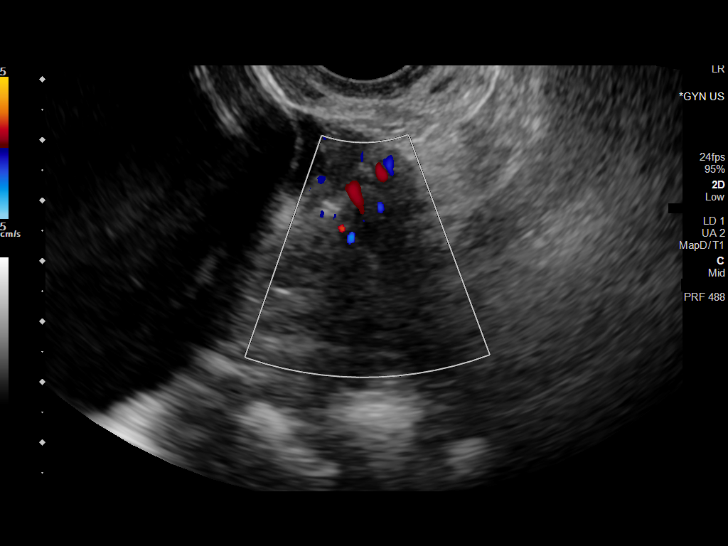
[im 47/63]
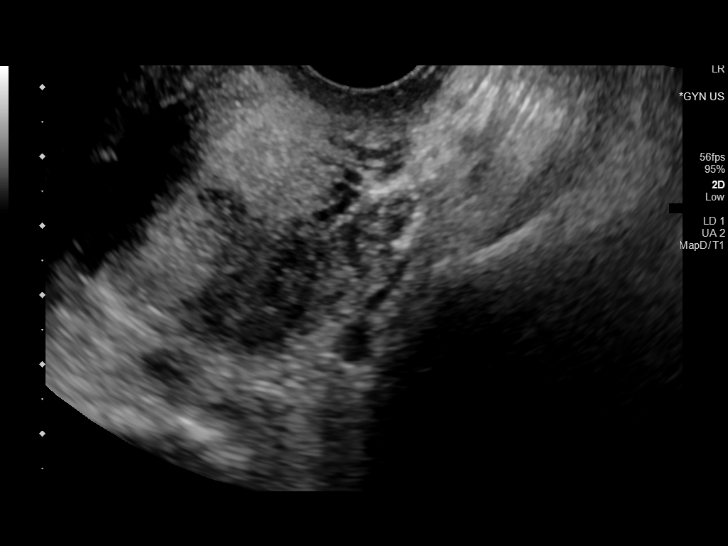
[im 52/63]
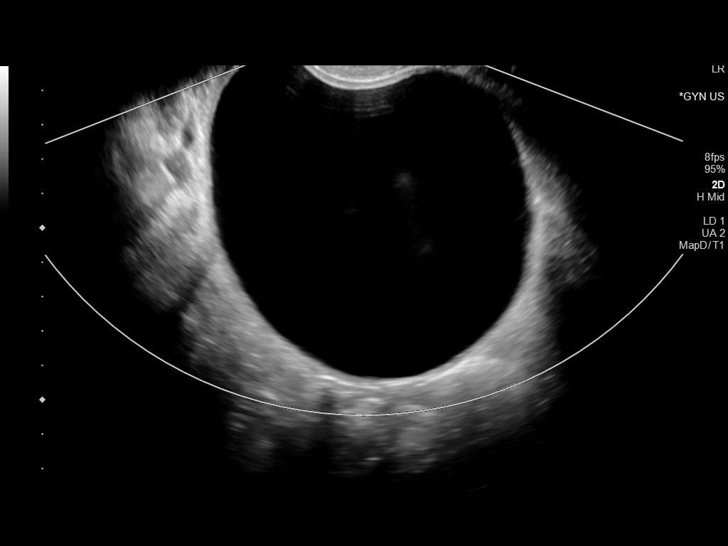
[im 57/63]
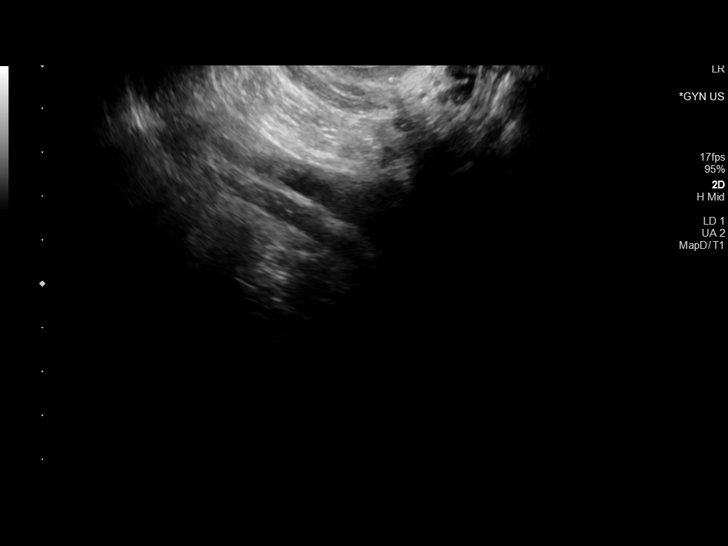
[im 63/63]
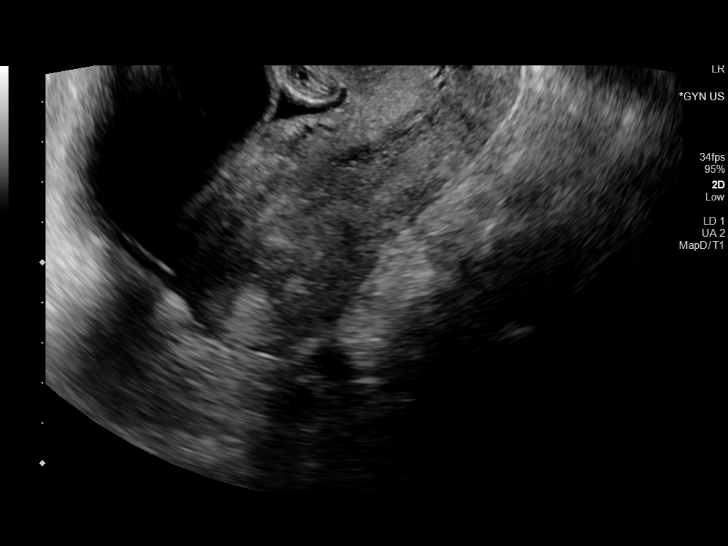

[13 of 25 positions shown; findings below may reference images not displayed]

FINDINGS: Uterus

Measurements: 10.5 x 4.2 x 6.3 cm = volume: 146 mL. At least 2 small
fibroids, the largest 1.8 cm in the posterior fundus.

Endometrium

Thickness: 7 mm in thickness.  No focal abnormality visualized.

Right ovary

Measurements: 2.4 x 2.1 x 2.7 cm = volume: mL. Right ovary
predominantly replaced with minimal residual ovarian tissue. Cyst
measures 9.7 x 8.4 x 7.4 cm. This previously measured up to 10.7 cm.

Left ovary

Measurements: 4.2 x 2.4 x 2.5 cm = volume: 12.8 mL. 1.7 cm follicle.
No adnexal mass.

Other findings

No abnormal free fluid.
IMPRESSION: Large right ovarian cyst measures up to 9.7 cm compared to 10.7 cm
previously. This change is minimal and may reflect different scan
planes.

Resolution of the previously seen left ovarian cyst with small
follicle currently.

Small uterine fibroids.

## 2021-07-10 ENCOUNTER — Telehealth (HOSPITAL_COMMUNITY): Payer: Self-pay | Admitting: Family Medicine

## 2021-08-01 ENCOUNTER — Telehealth (HOSPITAL_COMMUNITY): Payer: Self-pay

## 2021-08-03 ENCOUNTER — Ambulatory Visit (INDEPENDENT_AMBULATORY_CARE_PROVIDER_SITE_OTHER): Payer: 59 | Admitting: Licensed Clinical Social Worker

## 2021-08-03 DIAGNOSIS — F419 Anxiety disorder, unspecified: Secondary | ICD-10-CM | POA: Diagnosis not present

## 2021-08-03 DIAGNOSIS — F4310 Post-traumatic stress disorder, unspecified: Secondary | ICD-10-CM | POA: Diagnosis not present
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
## 2021-08-04 ENCOUNTER — Encounter (HOSPITAL_COMMUNITY): Payer: Self-pay

## 2021-08-04 NOTE — Plan of Care (Signed)
Developed treatment plan based on pt input, previous record review, and previous progress documented

## 2021-08-24 ENCOUNTER — Ambulatory Visit (HOSPITAL_COMMUNITY): Payer: 59 | Admitting: Licensed Clinical Social Worker

## 2021-08-30 ENCOUNTER — Telehealth (HOSPITAL_COMMUNITY): Payer: Self-pay | Admitting: Licensed Clinical Social Worker

## 2021-08-30 ENCOUNTER — Ambulatory Visit (INDEPENDENT_AMBULATORY_CARE_PROVIDER_SITE_OTHER): Payer: Self-pay | Admitting: Licensed Clinical Social Worker

## 2021-08-30 DIAGNOSIS — Z91199 Patient's noncompliance with other medical treatment and regimen due to unspecified reason: Secondary | ICD-10-CM

## 2021-08-30 NOTE — Progress Notes (Signed)
LCSW counselor tried to connect with patient for scheduled appointment via MyChart video text request x 2 and email request; also tried to connect via phone without success. LCSW counselor left message for patient to call office number to reschedule OPT appointment.   Attempt 1: Text and email: 8:03a  Attempt 2: Text and email: 8:11a  Attempt 3: phone call: 8:16a  Visit will be coded as NoShow

## 2021-08-30 NOTE — Telephone Encounter (Signed)
LCSW counselor tried to connect with patient for scheduled appointment via MyChart video text request x 2 and email request; also tried to connect via phone without success. LCSW counselor left message for patient to call office number to reschedule OPT appointment.   Attempt 1: Text and email: 8:03a  Attempt 2: Text and email: 8:11a  Attempt 3: phone call: 8:16a  Visit will be coded as NoShow

## 2022-05-09 ENCOUNTER — Other Ambulatory Visit: Payer: Self-pay | Admitting: Internal Medicine

## 2022-05-09 DIAGNOSIS — E049 Nontoxic goiter, unspecified: Secondary | ICD-10-CM

## 2022-05-10 ENCOUNTER — Ambulatory Visit
Admission: RE | Admit: 2022-05-10 | Discharge: 2022-05-10 | Disposition: A | Discharge status: Home / Self Care | Payer: Commercial Managed Care - HMO | Source: Ambulatory Visit | Attending: Internal Medicine | Admitting: Internal Medicine

## 2022-05-10 DIAGNOSIS — E049 Nontoxic goiter, unspecified: Secondary | ICD-10-CM

## 2022-05-17 ENCOUNTER — Other Ambulatory Visit: Payer: Self-pay | Admitting: Internal Medicine

## 2022-05-17 DIAGNOSIS — Z808 Family history of malignant neoplasm of other organs or systems: Secondary | ICD-10-CM

## 2022-05-17 DIAGNOSIS — E041 Nontoxic single thyroid nodule: Secondary | ICD-10-CM

## 2022-06-13 ENCOUNTER — Other Ambulatory Visit (HOSPITAL_COMMUNITY)
Admission: RE | Admit: 2022-06-13 | Discharge: 2022-06-13 | Disposition: A | Discharge status: Home / Self Care | Payer: 59 | Source: Ambulatory Visit | Attending: Internal Medicine | Admitting: Internal Medicine

## 2022-06-13 ENCOUNTER — Ambulatory Visit
Admission: RE | Admit: 2022-06-13 | Discharge: 2022-06-13 | Disposition: A | Discharge status: Home / Self Care | Payer: Commercial Managed Care - HMO | Source: Ambulatory Visit | Attending: Internal Medicine | Admitting: Internal Medicine

## 2022-06-13 DIAGNOSIS — E041 Nontoxic single thyroid nodule: Secondary | ICD-10-CM | POA: Insufficient documentation

## 2022-06-13 DIAGNOSIS — Z808 Family history of malignant neoplasm of other organs or systems: Secondary | ICD-10-CM

## 2022-06-15 LAB — CYTOLOGY - NON PAP

## 2023-02-26 ENCOUNTER — Other Ambulatory Visit: Payer: Self-pay

## 2023-02-26 ENCOUNTER — Emergency Department (HOSPITAL_BASED_OUTPATIENT_CLINIC_OR_DEPARTMENT_OTHER)
Admission: EM | Admit: 2023-02-26 | Discharge: 2023-02-26 | Disposition: A | Discharge status: Home / Self Care | Payer: Commercial Managed Care - HMO | Attending: Emergency Medicine | Admitting: Emergency Medicine

## 2023-02-26 ENCOUNTER — Encounter (HOSPITAL_BASED_OUTPATIENT_CLINIC_OR_DEPARTMENT_OTHER): Payer: Self-pay | Admitting: Emergency Medicine

## 2023-02-26 DIAGNOSIS — R059 Cough, unspecified: Secondary | ICD-10-CM | POA: Insufficient documentation

## 2023-02-26 DIAGNOSIS — R110 Nausea: Secondary | ICD-10-CM | POA: Insufficient documentation

## 2023-02-26 DIAGNOSIS — Z7984 Long term (current) use of oral hypoglycemic drugs: Secondary | ICD-10-CM | POA: Diagnosis not present

## 2023-02-26 DIAGNOSIS — M791 Myalgia, unspecified site: Secondary | ICD-10-CM | POA: Insufficient documentation

## 2023-02-26 DIAGNOSIS — E119 Type 2 diabetes mellitus without complications: Secondary | ICD-10-CM | POA: Diagnosis not present

## 2023-02-26 DIAGNOSIS — J111 Influenza due to unidentified influenza virus with other respiratory manifestations: Secondary | ICD-10-CM

## 2023-02-26 DIAGNOSIS — R6883 Chills (without fever): Secondary | ICD-10-CM | POA: Diagnosis not present

## 2023-02-26 DIAGNOSIS — Z79899 Other long term (current) drug therapy: Secondary | ICD-10-CM | POA: Diagnosis not present

## 2023-02-26 DIAGNOSIS — I10 Essential (primary) hypertension: Secondary | ICD-10-CM | POA: Diagnosis not present

## 2023-02-26 MED ORDER — ALBUTEROL SULFATE HFA 108 (90 BASE) MCG/ACT IN AERS: 2.0000 | INHALATION_SPRAY | RESPIRATORY_TRACT | 0 refills | Status: AC | PRN

## 2023-02-26 MED ORDER — PREDNISONE 50 MG PO TABS
60.0000 mg | ORAL_TABLET | Freq: Once | ORAL | Status: AC
  Administered 2023-02-26: 60 mg via ORAL
  Filled 2023-02-26: qty 1

## 2023-02-26 MED ORDER — IPRATROPIUM-ALBUTEROL 0.5-2.5 (3) MG/3ML IN SOLN
3.0000 mL | Freq: Once | RESPIRATORY_TRACT | Status: AC
  Administered 2023-02-26: 3 mL via RESPIRATORY_TRACT
  Filled 2023-02-26: qty 3

## 2023-02-26 MED ORDER — PREDNISONE 50 MG PO TABS: 50.0000 mg | ORAL_TABLET | Freq: Every day | ORAL | 0 refills | Status: AC

## 2023-02-26 NOTE — ED Provider Notes (Signed)
[place] [facility] DEPARTMENT AT [facility] Provider Note   CSN: [id number] Arrival date & time: 02/26/23  0444     History  Chief Complaint  Patient presents with   Chills    Holly Wells is a 53 y.o. female.  The history is provided by the patient.  She has history of hypertension, diabetes, hyperlipidemia and comes in with a 1 week history of generally feeling cold, cough productive of some white sputum, generalized bodyaches.  There has been some mild nausea.  She has had some chills and sweats but no fever.  She is not aware of any sick contacts.  Symptoms got worse over the last 24 hours which is what prompted her to come to the emergency department.   Home Medications Prior to Admission medications   Medication Sig Start Date End Date Taking? Authorizing Provider  amoxicillin-clavulanate (AUGMENTIN) 875-125 MG tablet Take 1 tablet by mouth 2 (two) times daily. One po bid x 7 days 04/17/20   Melene Plan, DO  atorvastatin (LIPITOR) 10 MG tablet Take 10 mg by mouth daily. 05/06/19   [provider]  ELDERBERRY PO Take 1 tablet by mouth daily.    [provider]  erythromycin base (E-MYCIN) 500 MG tablet Take two tablets the day before surgery at 2pm, 3pm, 10pm 06/08/19   Cross, Melissa D, NP  FLOVENT HFA 110 MCG/ACT inhaler Inhale 2 puffs into the lungs at bedtime as needed (shortness of breath).  08/30/18   [provider]  hydrOXYzine (ATARAX/VISTARIL) 25 MG tablet Take 1 tablet (25 mg total) by mouth every 6 (six) hours. 10/02/18   Rosezella Rumpf, PA-C  ibuprofen (ADVIL) 200 MG tablet Take 400 mg by mouth every 8 (eight) hours as needed for headache or moderate pain.     [provider]  ibuprofen (ADVIL) 800 MG tablet Take 1 tablet (800 mg total) by mouth every 8 (eight) hours as needed for moderate pain. For AFTER surgery 04/29/19   Warner Mccreedy D, NP  INVOKANA 300 MG TABS tablet Take 300 mg by mouth daily. 07/21/18   [provider]  Ketotifen Fumarate (ALLERGY EYE DROPS OP) Place 1 drop into both eyes daily as needed (allergies).    [provider]  liraglutide (VICTOZA) 18 MG/3ML SOPN Inject 0.6 mg into the skin daily.     [provider]  lisinopril-hydrochlorothiazide (PRINZIDE,ZESTORETIC) 20-25 MG per tablet Take 1 tablet by mouth daily.      [provider]  loratadine (CLARITIN) 10 MG tablet Take 10 mg by mouth daily.    [provider]  metFORMIN (GLUCOPHAGE) 1000 MG tablet Take 1,000 mg by mouth in the morning and at bedtime.     [provider]  Multiple Vitamin (MULTIVITAMIN WITH MINERALS) TABS tablet Take 1 tablet by mouth daily.    [provider]  neomycin (MYCIFRADIN) 500 MG tablet Take two tablets the day before surgery at 2pm, 3pm, 10pm 06/08/19   Cross, Melissa D, NP  nitrofurantoin, macrocrystal-monohydrate, (MACROBID) 100 MG capsule Take 1 capsule (100 mg total) by mouth 2 (two) times daily. 12/22/18   Warner Mccreedy D, NP  oxyCODONE (OXY IR/ROXICODONE) 5 MG immediate release tablet Take 1 tablet (5 mg total) by mouth every 4 (four) hours as needed for severe pain. For AFTER surgery, do not take and drive 7/82/95   Cross, Efraim Kaufmann D, NP  senna-docusate (SENOKOT-S) 8.6-50 MG tablet Take 2 tablets by mouth at bedtime. For AFTER surgery, do not  take if having diarrhea 04/29/19   Cross, Efraim Kaufmann D, NP  traMADol (ULTRAM) 50 MG tablet 1-2 tabs every 6 hours prn moderate pain Patient taking differently: Take 50-100 mg by mouth every 6 (six) hours as needed for moderate pain or severe pain.  06/24/18   Genia Del, MD      Allergies    Bactrim [sulfamethoxazole-trimethoprim] and Other    Review of Systems   Review of Systems  All other systems reviewed and are negative.   Physical Exam Updated Vital Signs BP 104/67 (BP Location: Right Arm)   Pulse 83   Temp (!) 97.4 F (36.3 C) (Oral)   Resp 20   Ht 5\' 5"  (1.651 m)   Wt 90.7 kg    SpO2 99%   BMI 33.28 kg/m  Physical Exam Vitals and nursing note reviewed.   53 year old female, resting comfortably and in no acute distress. Vital signs are normal. Oxygen saturation is 99%, which is normal. Head is normocephalic and atraumatic. PERRLA, EOMI. Oropharynx is clear. Neck is nontender and supple without adenopathy. Lungs are clear without rales, wheezes, or rhonchi.  There was a slight prolonged exhalation phase. Chest is nontender. Heart has regular rate and rhythm without murmur. Abdomen is soft, flat, nontender. Extremities have no cyanosis or edema, full range of motion is present. Skin is warm and dry without rash. Neurologic: Mental status is normal, moves all extremities equally.  ED Results / Procedures / Treatments    Procedures Procedures    Medications Ordered in ED Medications - No data to display  ED Course/ Medical Decision Making/ A&P                                 Medical Decision Making Risk Prescription drug management.   Influenza-like illness.  Differential diagnosis includes, but is not limited to, influenza, COVID-19, RSV.  However, because of length of time symptoms have been present, she is not a candidate for antiviral treatments.  Therefore, there is no value to testing for viral etiologies.  I have ordered a therapeutic trial of albuterol and ipratropium via nebulizer to see if that will help with her coughing.  She states that she cannot take acetaminophen, per orders of her primary care physician.  She did not some improvement with nebulizer treatment.  I have ordered a dose of prednisone and I am discharging her with prescriptions for prednisone and albuterol inhaler.  She is to continue symptomatic treatment with fluids, ibuprofen.  Return for worsening symptoms.  Final Clinical Impression(s) / ED Diagnoses Final diagnoses:  Influenza-like illness    Rx / DC Orders ED Discharge Orders          Ordered    albuterol  (VENTOLIN HFA) 108 (90 Base) MCG/ACT inhaler  Every 4 hours PRN        02/26/23 0624    predniSONE (DELTASONE) 50 MG tablet  Daily        02/26/23 0624              Dione Booze, MD 02/26/23 [phone]

## 2023-02-26 NOTE — Discharge Instructions (Addendum)
Rest.  Drink plenty of fluids.  Take ibuprofen as needed for fever or aching.  Return if you have new or concerning symptoms.

## 2023-02-26 NOTE — ED Triage Notes (Signed)
Pt states feels like freezing cold with chills. Feels off. Has sweats and cough with congestion.

## 2023-08-29 ENCOUNTER — Emergency Department (HOSPITAL_BASED_OUTPATIENT_CLINIC_OR_DEPARTMENT_OTHER)
Admission: EM | Admit: 2023-08-29 | Discharge: 2023-08-29 | Disposition: A | Discharge status: Home / Self Care | Attending: Emergency Medicine | Admitting: Emergency Medicine

## 2023-08-29 ENCOUNTER — Other Ambulatory Visit: Payer: Self-pay

## 2023-08-29 ENCOUNTER — Encounter (HOSPITAL_BASED_OUTPATIENT_CLINIC_OR_DEPARTMENT_OTHER): Payer: Self-pay

## 2023-08-29 DIAGNOSIS — Z7984 Long term (current) use of oral hypoglycemic drugs: Secondary | ICD-10-CM | POA: Insufficient documentation

## 2023-08-29 DIAGNOSIS — J019 Acute sinusitis, unspecified: Secondary | ICD-10-CM | POA: Diagnosis not present

## 2023-08-29 DIAGNOSIS — J329 Chronic sinusitis, unspecified: Secondary | ICD-10-CM

## 2023-08-29 DIAGNOSIS — I10 Essential (primary) hypertension: Secondary | ICD-10-CM | POA: Diagnosis not present

## 2023-08-29 DIAGNOSIS — R519 Headache, unspecified: Secondary | ICD-10-CM | POA: Diagnosis present

## 2023-08-29 DIAGNOSIS — Z79899 Other long term (current) drug therapy: Secondary | ICD-10-CM | POA: Insufficient documentation

## 2023-08-29 DIAGNOSIS — E119 Type 2 diabetes mellitus without complications: Secondary | ICD-10-CM | POA: Insufficient documentation

## 2023-08-29 NOTE — ED Triage Notes (Signed)
 Pt reports pressure behind the eyes, postnasal drip, ear p[ain & headaches X 7  days. Tried OTC meds but has not helped.

## 2023-08-29 NOTE — ED Provider Notes (Signed)
 [facility] Provider Note   CSN: [id number] Arrival date & time: 08/29/23  1710     Patient presents with: Facial Pain   Holly Wells is a 53 y.o. female.   53 year old female presenting with sinus pressure.  Symptoms been ongoing for approximately 6 days, she describes left-sided sinus pressure/headache and postnasal drip, she has tried ibuprofen  with minimal relief.  She has history of recurrent sinusitis following prior COVID infection, says that she has been trying to get in to see an ear nose and throat specialist in regard to the symptoms.  She denies fever, cough, shortness of breath, chest pain.        Prior to Admission medications   Medication Sig Start Date End Date Taking? Authorizing Provider  albuterol  (VENTOLIN  HFA) 108 (90 Base) MCG/ACT inhaler Inhale 2 puffs into the lungs every 4 (four) hours as needed for wheezing or shortness of breath. 02/26/23   Raford Lenis, MD  atorvastatin (LIPITOR) 10 MG tablet Take 10 mg by mouth daily. 05/06/19   [provider]  ELDERBERRY PO Take 1 tablet by mouth daily.    [provider]  FLOVENT  HFA 110 MCG/ACT inhaler Inhale 2 puffs into the lungs at bedtime as needed (shortness of breath).  08/30/18   [provider]  hydrOXYzine  (ATARAX /VISTARIL ) 25 MG tablet Take 1 tablet (25 mg total) by mouth every 6 (six) hours. 10/02/18   Kehrli, Kelsey F, PA-C  ibuprofen  (ADVIL ) 200 MG tablet Take 400 mg by mouth every 8 (eight) hours as needed for headache or moderate pain.     [provider]  ibuprofen  (ADVIL ) 800 MG tablet Take 1 tablet (800 mg total) by mouth every 8 (eight) hours as needed for moderate pain. For AFTER surgery 04/29/19   Cross, Melissa D, NP  INVOKANA 300 MG TABS tablet Take 300 mg by mouth daily. 07/21/18   [provider]  Ketotifen Fumarate (ALLERGY EYE DROPS OP) Place 1 drop into both eyes daily as needed (allergies).    [provider]  liraglutide (VICTOZA) 18 MG/3ML SOPN Inject 0.6 mg into the skin daily.     [provider]  lisinopril -hydrochlorothiazide  (PRINZIDE,ZESTORETIC) 20-25 MG per tablet Take 1 tablet by mouth daily.      [provider]  loratadine  (CLARITIN ) 10 MG tablet Take 10 mg by mouth daily.    [provider]  metFORMIN  (GLUCOPHAGE ) 1000 MG tablet Take 1,000 mg by mouth in the morning and at bedtime.     [provider]  Multiple Vitamin (MULTIVITAMIN WITH MINERALS) TABS tablet Take 1 tablet by mouth daily.    [provider]  oxyCODONE  (OXY IR/ROXICODONE ) 5 MG immediate release tablet Take 1 tablet (5 mg total) by mouth every 4 (four) hours as needed for severe pain. For AFTER surgery, do not take and drive 6/75/78   Cross, Melissa D, NP  predniSONE  (DELTASONE ) 50 MG tablet Take 1 tablet (50 mg total) by mouth daily. 02/26/23   Raford Lenis, MD  senna-docusate (SENOKOT-S) 8.6-50 MG tablet Take 2 tablets by mouth at bedtime. For AFTER surgery, do not take if having diarrhea 04/29/19   Cross, Melissa D, NP  traMADol  (ULTRAM ) 50 MG tablet 1-2 tabs every 6 hours prn moderate pain Patient taking differently: Take 50-100 mg by mouth every 6 (six) hours as needed for moderate pain or severe pain.  06/24/18   Lavoie, Marie-Lyne, MD    Allergies: Bactrim  [sulfamethoxazole -trimethoprim ] and Other  Review of Systems  Updated Vital Signs BP 115/83 (BP Location: Left Arm)   Pulse 100   Temp 98.2 F (36.8 C) (Oral)   Resp 16   SpO2 97%   Physical Exam Vitals and nursing note reviewed.  HENT:     Head: Normocephalic.     Comments: Mild tenderness to palpation of L maxillary sinus    Right Ear: Tympanic membrane normal.     Left Ear: Tympanic membrane normal.  Eyes:     Extraocular Movements: Extraocular movements intact.     Pupils: Pupils are equal, round, and reactive to light.  Cardiovascular:     Rate and Rhythm: Normal rate and regular  rhythm.     Heart sounds: Normal heart sounds.  Pulmonary:     Effort: Pulmonary effort is normal.     Breath sounds: Normal breath sounds.  Musculoskeletal:     Cervical back: Normal range of motion and neck supple. No rigidity or tenderness.     Comments: Moves all extremities spontaneously without difficulty  Lymphadenopathy:     Cervical: No cervical adenopathy.  Neurological:     Mental Status: She is alert and oriented to person, place, and time.     (all labs ordered are listed, but only abnormal results are displayed) Labs Reviewed - No data to display  EKG: None  Radiology: No results found.   Procedures   Medications Ordered in the ED - No data to display                                  Medical Decision Making This patient presents to the ED for concern of sinus pressure, this involves an extensive number of treatment options, and is a complaint that carries with it a high risk of complications and morbidity.  The differential diagnosis includes sinusitis, bacterial sinusitis, COVID/flu/RSV, allergic rhinitis.   Co morbidities that complicate the patient evaluation  Hypertension, GERD, diabetes   Additional history obtained:  Additional history obtained from record review External records from outside source obtained and reviewed including prior ED note   Cardiac Monitoring: / EKG:  The patient was maintained on a cardiac monitor.  I personally viewed and interpreted the cardiac monitored which showed an underlying rhythm of: NSR   Problem List / ED Course / Critical interventions / Medication management I have reviewed the patients home medicines and have made adjustments as needed   Social Determinants of Health:  Tobacco use, depression   Test / Admission - Considered:  Physical exam notable for mild tenderness to palpation of the left maxillary sinus, this is likely consistent with sinusitis, symptoms been ongoing for < 1 week, patient  denies fever/nasal drainage, I have a very low suspicion for bacterial sinusitis given the short nature of the symptoms.  Physical exam is otherwise unremarkable, vitals are reassuring, I do not feel that any additional labs/imaging are warranted at this time given these reassuring findings.  Advised patient to continue ibuprofen  as needed for sinus pressure/pain.  Start Flonase , Mucinex ,  Zyrtec /Claritin  for relief of symptoms.  Patient notes that she has had recurrent sinus infections since previous COVID diagnosis, will provide her with the contact information for an ear nose and throat specialist to follow-up in regard to her recurrent sinus issues.  Patient voiced understanding and is in agreement with this plan, return precautions discussed, she is appropriate discharge at this time.  Final diagnoses:  Sinusitis, unspecified chronicity, unspecified location    ED Discharge Orders     None          Glendia Rocky SAILOR, [place] 08/29/23 2349    Pamella Ozell LABOR, DO 09/01/23 1828

## 2023-08-29 NOTE — Discharge Instructions (Addendum)
 Continue ibuprofen  as needed for sinus pressure/pain.  Start Flonase , use as directed on the packaging.  Start Mucinex , use as directed on the packaging.  Start Zyrtec /Claritin , use as directed on the packaging. I have provided you with the contact information for an ear nose and throat specialist, please follow-up with them if your symptoms persist or return.  Follow-up with your primary care provider.  Return to emergency department if your symptoms worsen.
